# Patient Record
Sex: Female | Born: 1970 | Race: Black or African American | Hispanic: No | Marital: Single | Smoking: Never smoker
Health system: Southern US, Community
[De-identification: ages and names within clinical notes are randomized; demographics above are authoritative.]

## PROBLEM LIST (undated history)

## (undated) DIAGNOSIS — M2550 Pain in unspecified joint: Secondary | ICD-10-CM

## (undated) DIAGNOSIS — E039 Hypothyroidism, unspecified: Secondary | ICD-10-CM

## (undated) DIAGNOSIS — G4730 Sleep apnea, unspecified: Secondary | ICD-10-CM

## (undated) DIAGNOSIS — E0590 Thyrotoxicosis, unspecified without thyrotoxic crisis or storm: Secondary | ICD-10-CM

## (undated) DIAGNOSIS — R600 Localized edema: Secondary | ICD-10-CM

## (undated) DIAGNOSIS — G43909 Migraine, unspecified, not intractable, without status migrainosus: Secondary | ICD-10-CM

## (undated) DIAGNOSIS — L603 Nail dystrophy: Secondary | ICD-10-CM

## (undated) DIAGNOSIS — B351 Tinea unguium: Secondary | ICD-10-CM

## (undated) DIAGNOSIS — M545 Low back pain, unspecified: Secondary | ICD-10-CM

## (undated) DIAGNOSIS — D649 Anemia, unspecified: Secondary | ICD-10-CM

## (undated) DIAGNOSIS — R809 Proteinuria, unspecified: Secondary | ICD-10-CM

## (undated) DIAGNOSIS — D573 Sickle-cell trait: Secondary | ICD-10-CM

## (undated) DIAGNOSIS — R202 Paresthesia of skin: Secondary | ICD-10-CM

## (undated) DIAGNOSIS — J45909 Unspecified asthma, uncomplicated: Secondary | ICD-10-CM

## (undated) DIAGNOSIS — R0602 Shortness of breath: Secondary | ICD-10-CM

## (undated) DIAGNOSIS — E119 Type 2 diabetes mellitus without complications: Secondary | ICD-10-CM

## (undated) DIAGNOSIS — F32A Depression, unspecified: Secondary | ICD-10-CM

## (undated) DIAGNOSIS — R5383 Other fatigue: Secondary | ICD-10-CM

## (undated) DIAGNOSIS — K8020 Calculus of gallbladder without cholecystitis without obstruction: Secondary | ICD-10-CM

## (undated) DIAGNOSIS — R609 Edema, unspecified: Secondary | ICD-10-CM

## (undated) DIAGNOSIS — G8929 Other chronic pain: Secondary | ICD-10-CM

## (undated) DIAGNOSIS — K5900 Constipation, unspecified: Secondary | ICD-10-CM

## (undated) DIAGNOSIS — E1149 Type 2 diabetes mellitus with other diabetic neurological complication: Secondary | ICD-10-CM

## (undated) DIAGNOSIS — E1129 Type 2 diabetes mellitus with other diabetic kidney complication: Secondary | ICD-10-CM

## (undated) DIAGNOSIS — E079 Disorder of thyroid, unspecified: Secondary | ICD-10-CM

## (undated) DIAGNOSIS — F419 Anxiety disorder, unspecified: Secondary | ICD-10-CM

## (undated) DIAGNOSIS — G459 Transient cerebral ischemic attack, unspecified: Secondary | ICD-10-CM

## (undated) HISTORY — PX: HERNIA REPAIR: SHX51

## (undated) HISTORY — DX: Other chronic pain: G89.29

## (undated) HISTORY — PX: DILATION AND CURETTAGE OF UTERUS: SHX78

## (undated) HISTORY — DX: Sickle-cell trait: D57.3

## (undated) HISTORY — DX: Thyrotoxicosis, unspecified without thyrotoxic crisis or storm: E05.90

## (undated) HISTORY — DX: Shortness of breath: R06.02

## (undated) HISTORY — DX: Sleep apnea, unspecified: G47.30

## (undated) HISTORY — DX: Depression, unspecified: F32.A

## (undated) HISTORY — DX: Nail dystrophy: L60.3

## (undated) HISTORY — DX: Hypothyroidism, unspecified: E03.9

## (undated) HISTORY — DX: Other fatigue: R53.83

## (undated) HISTORY — DX: Anxiety disorder, unspecified: F41.9

## (undated) HISTORY — DX: Calculus of gallbladder without cholecystitis without obstruction: K80.20

## (undated) HISTORY — PX: CHOLECYSTECTOMY: SHX55

## (undated) HISTORY — DX: Tinea unguium: B35.1

## (undated) HISTORY — DX: Localized edema: R60.0

## (undated) HISTORY — DX: Migraine, unspecified, not intractable, without status migrainosus: G43.909

## (undated) HISTORY — DX: Low back pain, unspecified: M54.50

## (undated) HISTORY — DX: Unspecified asthma, uncomplicated: J45.909

## (undated) HISTORY — DX: Pain in unspecified joint: M25.50

## (undated) HISTORY — DX: Constipation, unspecified: K59.00

## (undated) HISTORY — DX: Low back pain: M54.5

---

## 2015-08-26 DIAGNOSIS — L603 Nail dystrophy: Secondary | ICD-10-CM | POA: Insufficient documentation

## 2015-08-26 DIAGNOSIS — B351 Tinea unguium: Secondary | ICD-10-CM | POA: Insufficient documentation

## 2016-02-13 DIAGNOSIS — G8929 Other chronic pain: Secondary | ICD-10-CM | POA: Insufficient documentation

## 2016-04-19 DIAGNOSIS — E611 Iron deficiency: Secondary | ICD-10-CM | POA: Insufficient documentation

## 2016-04-19 DIAGNOSIS — G4730 Sleep apnea, unspecified: Secondary | ICD-10-CM | POA: Insufficient documentation

## 2016-09-21 ENCOUNTER — Ambulatory Visit (HOSPITAL_COMMUNITY)
Admission: EM | Admit: 2016-09-21 | Discharge: 2016-09-21 | Disposition: A | Discharge status: Home / Self Care | Payer: Medicaid Other | Attending: Internal Medicine | Admitting: Internal Medicine

## 2016-09-21 ENCOUNTER — Encounter (HOSPITAL_COMMUNITY): Payer: Self-pay | Admitting: *Deleted

## 2016-09-21 DIAGNOSIS — R42 Dizziness and giddiness: Secondary | ICD-10-CM

## 2016-09-21 DIAGNOSIS — R110 Nausea: Secondary | ICD-10-CM

## 2016-09-21 HISTORY — DX: Type 2 diabetes mellitus without complications: E11.9

## 2016-09-21 HISTORY — DX: Edema, unspecified: R60.9

## 2016-09-21 HISTORY — DX: Anemia, unspecified: D64.9

## 2016-09-21 HISTORY — DX: Disorder of thyroid, unspecified: E07.9

## 2016-09-21 MED ORDER — ONDANSETRON 4 MG PO TBDP
4.0000 mg | ORAL_TABLET | Freq: Once | ORAL | Status: AC
  Administered 2016-09-21: 4 mg via ORAL

## 2016-09-21 MED ORDER — ONDANSETRON 4 MG PO TBDP
ORAL_TABLET | ORAL | Status: AC
  Filled 2016-09-21: qty 1

## 2016-09-21 MED ORDER — IPRATROPIUM BROMIDE 0.06 % NA SOLN: 2.0000 | Freq: Four times a day (QID) | NASAL | 0 refills | Status: DC

## 2016-09-21 MED ORDER — MECLIZINE HCL 25 MG PO TABS: 25.0000 mg | ORAL_TABLET | Freq: Three times a day (TID) | ORAL | 0 refills | Status: DC | PRN

## 2016-09-21 NOTE — ED Triage Notes (Signed)
Pt  Reports   sev  Weeks  Of  dizzyyness  Nausea   Chills  Fatigue        Worse  Last  Pm  Pt  Is  Anemic   And  Has  hypothriod       Symptoms  Worse  Last  Pm

## 2016-09-21 NOTE — ED Provider Notes (Signed)
CSN: [id number]     Arrival date & time 09/21/16  1317 History   None    Chief Complaint  Patient presents with  . Dizziness   (Consider location/radiation/quality/duration/timing/severity/associated sxs/prior Treatment) Patient c/o dizziness, nausea, and fatigue.   The history is provided by the patient.  Dizziness  Quality:  Head spinning Severity:  Mild Onset quality:  Sudden Duration:  1 day Timing:  Constant Progression:  Worsening Chronicity:  New Context: bending over and head movement   Relieved by:  Nothing Worsened by:  Nothing Ineffective treatments:  None tried   Past Medical History:  Diagnosis Date  . Anemia   . Diabetes mellitus without complication (HCC)   . Edema   . Thyroid disease    Past Surgical History:  Procedure Laterality Date  . CHOLECYSTECTOMY    . DILATION AND CURETTAGE OF UTERUS    . HERNIA REPAIR     History reviewed. No pertinent family history. Social History  Substance Use Topics  . Smoking status: Never Smoker  . Smokeless tobacco: Never Used  . Alcohol use No   OB History    No data available     Review of Systems  Constitutional: Negative.   HENT: Positive for congestion.   Eyes: Negative.   Respiratory: Negative.   Cardiovascular: Negative.   Gastrointestinal: Negative.   Endocrine: Negative.   Genitourinary: Negative.   Musculoskeletal: Negative.   Neurological: Positive for dizziness.  Hematological: Negative.   Psychiatric/Behavioral: Negative.     Allergies  Patient has no known allergies.  Home Medications   Prior to Admission medications   Medication Sig Start Date End Date Taking? Authorizing Provider  Dulaglutide (TRULICITY) 1.5 MG/0.5ML SOPN Inject into the skin.   Yes Historical Provider, MD  glimepiride (AMARYL) 4 MG tablet Take 4 mg by mouth 2 (two) times daily.   Yes Historical Provider, MD  Insulin Glargine (LANTUS [place]) Inject into the skin.   Yes Historical Provider, MD  levothyroxine  (SYNTHROID, LEVOTHROID) 137 MCG tablet Take 137 mcg by mouth daily before breakfast.   Yes Historical Provider, MD  ipratropium (ATROVENT) 0.06 % nasal spray Place 2 sprays into both nostrils 4 (four) times daily. 09/21/16   Deatra Canter, FNP  meclizine (ANTIVERT) 25 MG tablet Take 1 tablet (25 mg total) by mouth 3 (three) times daily as needed for dizziness. 09/21/16   Deatra Canter, FNP   Meds Ordered and Administered this Visit   Medications  ondansetron (ZOFRAN-ODT) disintegrating tablet 4 mg (4 mg Oral Given 09/21/16 1507)    BP 108/77 (BP Location: Right Arm)   Pulse 77   Temp 98.4 F (36.9 C) (Oral)   Resp 18   LMP 08/26/2016   SpO2 100%  No data found.   Physical Exam  Constitutional: She appears well-developed and well-nourished.  HENT:  Head: Normocephalic.  Right Ear: External ear normal.  Left Ear: External ear normal.  Mouth/Throat: Oropharynx is clear and moist.  Eyes: Conjunctivae and EOM are normal. Pupils are equal, round, and reactive to light.  Neck: Normal range of motion. Neck supple.  Cardiovascular: Normal rate, regular rhythm and normal heart sounds.   Pulmonary/Chest: Effort normal and breath sounds normal.  Abdominal: Soft. Bowel sounds are normal.  Nursing note and vitals reviewed.   Urgent Care Course     Procedures (including critical care time)  Labs Review Labs Reviewed - No data to display  Imaging Review No results found.   Visual Acuity Review  Right  Eye Distance:   Left Eye Distance:   Bilateral Distance:    Right Eye Near:   Left Eye Near:    Bilateral Near:         MDM   1. Vertigo   2. Nausea    Meclizine 25mg  one po tid prn Atrovent nasal spray  Push po fluids, rest, tylenol and motrin otc prn as directed for fever, arthralgias, and myalgias.  Follow up prn if sx's continue or persist.   Deatra CanterWilliam J Tiffony Kite, FNP 09/21/16 [phone]

## 2017-04-29 ENCOUNTER — Encounter: Payer: Self-pay | Admitting: *Deleted

## 2017-05-02 ENCOUNTER — Telehealth: Payer: Self-pay | Admitting: Neurology

## 2017-05-02 ENCOUNTER — Ambulatory Visit: Payer: Medicaid Other | Admitting: Neurology

## 2017-05-02 NOTE — Telephone Encounter (Signed)
This patient did not show for a new patient appointment today. 

## 2017-05-03 ENCOUNTER — Encounter: Payer: Self-pay | Admitting: Neurology

## 2017-05-17 ENCOUNTER — Encounter (INDEPENDENT_AMBULATORY_CARE_PROVIDER_SITE_OTHER): Payer: Self-pay

## 2017-05-17 ENCOUNTER — Encounter: Payer: Self-pay | Admitting: Neurology

## 2017-05-17 ENCOUNTER — Ambulatory Visit: Payer: Medicaid Other | Admitting: Neurology

## 2017-05-17 VITALS — BP 121/78 | HR 80 | Ht 65.0 in | Wt 197.5 lb

## 2017-05-17 DIAGNOSIS — R202 Paresthesia of skin: Secondary | ICD-10-CM

## 2017-05-17 HISTORY — DX: Paresthesia of skin: R20.2

## 2017-05-17 MED ORDER — GABAPENTIN 100 MG PO CAPS: 100.0000 mg | ORAL_CAPSULE | Freq: Three times a day (TID) | ORAL | 3 refills | Status: DC

## 2017-05-17 NOTE — Patient Instructions (Signed)
   We will get EMG and NCV evaluation to look at the nerve function of the arms and legs.

## 2017-05-17 NOTE — Progress Notes (Signed)
Reason for visit: Paresthesias  Referring physician: Dr. Lillia Carmelackett  Lindsey Bowen is a 46 y.o. female  History of present illness:  Lindsey Bowen is a 46 year old right-handed black female with a history of discomfort in the neck and low back and some paresthesias down the left arm primarily that have been present off and on since around 2009.  The patient believes that her symptoms came on when she worked for the [employer] when she was lifting and carrying things on a regular basis.  The patient indicates that she has paresthesias from the shoulder level all the way down to the hand that may come and go, sometimes she feels normal, but the episodes of symptomatology may last 1-2 days.  Occasionally she may have brief episodes on the right arm, and she has intermittent numbness down both legs as well.  The patient also reports chronic low back pain.  The patient occasionally have some collapse of the left leg, otherwise there is no weakness of the extremities.  The patient reports no difficulty controlling the bowels or the bladder.  She has had a bone spur on the left shoulder, she gets injections in the left shoulder on occasion.  The patient has had poorly controlled diabetes, she believes the last hemoglobin A1c level was 10.3.  The patient denies any symptomatology down either arm or leg with neck flexion or extension or rotation of the neck.  She is sent to this office for further evaluation of the above symptoms.  She does not take anything for the pain.  At times, she gets a crawling sensation down the left arm.  Past Medical History:  Diagnosis Date  . Anemia   . Diabetes mellitus without complication (HCC)   . Edema   . Lumbar pain   . Onychodystrophy   . Onychomycosis   . Sleep apnea   . Thyroid disease     Past Surgical History:  Procedure Laterality Date  . CHOLECYSTECTOMY    . DILATION AND CURETTAGE OF UTERUS    . HERNIA REPAIR      Family History  Problem Relation  Age of Onset  . Diabetes Mother   . Hypertension Mother   . Heart attack Maternal Grandmother   . Stroke Maternal Grandmother   . Diabetes Maternal Grandfather   . Cancer Maternal Grandfather     Social history:  reports that  has never smoked. she has never used smokeless tobacco. She reports that she does not drink alcohol. Her drug history is not on file.  Medications:  Prior to Admission medications   Medication Sig Start Date End Date Taking? Authorizing Provider  atorvastatin (LIPITOR) 10 MG tablet Take 10 mg by mouth daily.   Yes [provider]  cyclobenzaprine (FLEXERIL) 5 MG tablet Take 5 mg by mouth 3 (three) times daily as needed for muscle spasms.   Yes [provider]  Dulaglutide (TRULICITY) 1.5 MG/0.5ML SOPN Inject into the skin.   Yes [provider]  insulin aspart (NOVOLOG) 100 UNIT/ML injection Inject 100 Units into the skin.   Yes [provider]  Insulin Glargine (LANTUS [place]) Inject into the skin.   Yes [provider]  ipratropium (ATROVENT) 0.06 % nasal spray Place 2 sprays into both nostrils 4 (four) times daily. 09/21/16  Yes Deatra Canterxford, William J, FNP  levothyroxine (SYNTHROID, LEVOTHROID) 137 MCG tablet Take 137 mcg by mouth daily before breakfast.   Yes [provider]  lisinopril (PRINIVIL,ZESTRIL) 5 MG tablet Take  5 mg by mouth daily.   Yes [provider]  meclizine (ANTIVERT) 25 MG tablet Take 1 tablet (25 mg total) by mouth 3 (three) times daily as needed for dizziness. 09/21/16  Yes Deatra Canterxford, William J, FNP      Allergies  Allergen Reactions  . Dust Mite Extract Hives and Itching  . Shellfish Allergy Anaphylaxis    ROS:  Out of a complete 14 system review of symptoms, the patient complains only of the following symptoms, and all other reviewed systems are negative.  Chest pain, swelling in the legs Ringing in the ears Itching Blurred vision, eye pain Snoring Anemia Joint pain, aching  muscles Allergies Memory loss, headache, numbness, dizziness Depression, anxiety, none of sleep, decreased energy, racing thoughts Insomnia, sleepiness, restless legs  Blood pressure 121/78, pulse 80, height 5\' 5"  (1.651 m), weight 197 lb 8 oz (89.6 kg).  Physical Exam  General: The patient is alert and cooperative at the time of the examination.  Patient is moderately obese.  Eyes: Pupils are equal, round, and reactive to light. Discs are flat bilaterally.  Good venous pulsations are noted.  Neck: The neck is supple, no carotid bruits are noted.  Respiratory: The respiratory examination is clear.  Cardiovascular: The cardiovascular examination reveals a regular rate and rhythm, no obvious murmurs or rubs are noted.  Neuromuscular: Range of movement of the cervical spine and lumbar spine are excellent.  Skin: Extremities are without significant edema.  Neurologic Exam  Mental status: The patient is alert and oriented x 3 at the time of the examination. The patient has apparent normal recent and remote memory, with an apparently normal attention span and concentration ability.  Cranial nerves: Facial symmetry is present. There is good sensation of the face to pinprick and soft touch bilaterally. The strength of the facial muscles and the muscles to head turning and shoulder shrug are normal bilaterally. Speech is well enunciated, no aphasia or dysarthria is noted. Extraocular movements are full. Visual fields are full. The tongue is midline, and the patient has symmetric elevation of the soft palate. No obvious hearing deficits are noted.  Motor: The motor testing reveals 5 over 5 strength of all 4 extremities. Good symmetric motor tone is noted throughout.  Sensory: Sensory testing is intact to pinprick, soft touch, vibration sensation, and position sense on all 4 extremities, with exception of some decrease in pinprick sensation on the left forearm and left foot. No evidence of  extinction is noted.  Coordination: Cerebellar testing reveals good finger-nose-finger and heel-to-shin bilaterally.  Gait and station: Gait is normal. Tandem gait is normal. Romberg is negative. No drift is seen.  Reflexes: Deep tendon reflexes are symmetric and normal bilaterally. Toes are downgoing bilaterally.   Assessment/Plan:  1.  Paresthesias left arm, both legs  2.  Chronic neck and low back pain  The patient very well may have a fibromyalgia syndrome.  She has diffuse neuromuscular discomfort, paresthesias can go down the arms and legs, left greater than right.  The patient will be set up for nerve conduction studies done on both legs and left arm and EMG evaluation of the left arm.  The patient may go on to have further blood work depending upon the results of the above.  The patient will be placed on low-dose gabapentin taking 100 mg 3 times daily.  She will follow-up for the above study.  Marlan Palau. Keith Angie Hogg MD 05/17/2017 2:05 PM  [facility] [address] [place],  [place] [address]  Phone [phone] Fax [phone]

## 2017-05-26 ENCOUNTER — Encounter (HOSPITAL_COMMUNITY): Payer: Self-pay

## 2017-05-26 ENCOUNTER — Observation Stay (HOSPITAL_COMMUNITY): Payer: Medicaid Other

## 2017-05-26 ENCOUNTER — Observation Stay (HOSPITAL_COMMUNITY)
Admission: EM | Admit: 2017-05-26 | Discharge: 2017-05-27 | Disposition: A | Discharge status: Home / Self Care | Payer: Medicaid Other | Attending: Family Medicine | Admitting: Family Medicine

## 2017-05-26 ENCOUNTER — Emergency Department (HOSPITAL_COMMUNITY): Payer: Medicaid Other

## 2017-05-26 DIAGNOSIS — R809 Proteinuria, unspecified: Secondary | ICD-10-CM | POA: Diagnosis not present

## 2017-05-26 DIAGNOSIS — R5381 Other malaise: Secondary | ICD-10-CM | POA: Insufficient documentation

## 2017-05-26 DIAGNOSIS — E1159 Type 2 diabetes mellitus with other circulatory complications: Secondary | ICD-10-CM | POA: Diagnosis present

## 2017-05-26 DIAGNOSIS — E1140 Type 2 diabetes mellitus with diabetic neuropathy, unspecified: Secondary | ICD-10-CM | POA: Insufficient documentation

## 2017-05-26 DIAGNOSIS — Z9049 Acquired absence of other specified parts of digestive tract: Secondary | ICD-10-CM | POA: Insufficient documentation

## 2017-05-26 DIAGNOSIS — E785 Hyperlipidemia, unspecified: Secondary | ICD-10-CM | POA: Diagnosis not present

## 2017-05-26 DIAGNOSIS — Z823 Family history of stroke: Secondary | ICD-10-CM | POA: Diagnosis not present

## 2017-05-26 DIAGNOSIS — G43909 Migraine, unspecified, not intractable, without status migrainosus: Secondary | ICD-10-CM | POA: Diagnosis not present

## 2017-05-26 DIAGNOSIS — R531 Weakness: Secondary | ICD-10-CM | POA: Diagnosis not present

## 2017-05-26 DIAGNOSIS — R946 Abnormal results of thyroid function studies: Secondary | ICD-10-CM | POA: Diagnosis not present

## 2017-05-26 DIAGNOSIS — R0602 Shortness of breath: Secondary | ICD-10-CM | POA: Diagnosis not present

## 2017-05-26 DIAGNOSIS — R200 Anesthesia of skin: Secondary | ICD-10-CM | POA: Diagnosis not present

## 2017-05-26 DIAGNOSIS — M6289 Other specified disorders of muscle: Secondary | ICD-10-CM

## 2017-05-26 DIAGNOSIS — E1129 Type 2 diabetes mellitus with other diabetic kidney complication: Secondary | ICD-10-CM | POA: Diagnosis present

## 2017-05-26 DIAGNOSIS — Z8249 Family history of ischemic heart disease and other diseases of the circulatory system: Secondary | ICD-10-CM | POA: Insufficient documentation

## 2017-05-26 DIAGNOSIS — Z9109 Other allergy status, other than to drugs and biological substances: Secondary | ICD-10-CM | POA: Insufficient documentation

## 2017-05-26 DIAGNOSIS — G459 Transient cerebral ischemic attack, unspecified: Secondary | ICD-10-CM | POA: Diagnosis present

## 2017-05-26 DIAGNOSIS — E1169 Type 2 diabetes mellitus with other specified complication: Secondary | ICD-10-CM | POA: Diagnosis not present

## 2017-05-26 DIAGNOSIS — I10 Essential (primary) hypertension: Secondary | ICD-10-CM | POA: Diagnosis present

## 2017-05-26 DIAGNOSIS — E039 Hypothyroidism, unspecified: Secondary | ICD-10-CM | POA: Diagnosis present

## 2017-05-26 DIAGNOSIS — Z91013 Allergy to seafood: Secondary | ICD-10-CM | POA: Diagnosis not present

## 2017-05-26 DIAGNOSIS — B351 Tinea unguium: Secondary | ICD-10-CM | POA: Diagnosis not present

## 2017-05-26 DIAGNOSIS — I152 Hypertension secondary to endocrine disorders: Secondary | ICD-10-CM | POA: Diagnosis present

## 2017-05-26 DIAGNOSIS — Z809 Family history of malignant neoplasm, unspecified: Secondary | ICD-10-CM | POA: Diagnosis not present

## 2017-05-26 DIAGNOSIS — G4730 Sleep apnea, unspecified: Secondary | ICD-10-CM | POA: Diagnosis not present

## 2017-05-26 DIAGNOSIS — R202 Paresthesia of skin: Secondary | ICD-10-CM | POA: Diagnosis not present

## 2017-05-26 DIAGNOSIS — Z833 Family history of diabetes mellitus: Secondary | ICD-10-CM | POA: Insufficient documentation

## 2017-05-26 DIAGNOSIS — I6523 Occlusion and stenosis of bilateral carotid arteries: Secondary | ICD-10-CM | POA: Insufficient documentation

## 2017-05-26 DIAGNOSIS — E1149 Type 2 diabetes mellitus with other diabetic neurological complication: Secondary | ICD-10-CM | POA: Diagnosis present

## 2017-05-26 DIAGNOSIS — Z794 Long term (current) use of insulin: Secondary | ICD-10-CM | POA: Insufficient documentation

## 2017-05-26 DIAGNOSIS — Z79899 Other long term (current) drug therapy: Secondary | ICD-10-CM | POA: Insufficient documentation

## 2017-05-26 DIAGNOSIS — D649 Anemia, unspecified: Secondary | ICD-10-CM | POA: Insufficient documentation

## 2017-05-26 DIAGNOSIS — R4781 Slurred speech: Principal | ICD-10-CM | POA: Insufficient documentation

## 2017-05-26 HISTORY — DX: Type 2 diabetes mellitus with other diabetic kidney complication: E11.29

## 2017-05-26 HISTORY — DX: Paresthesia of skin: R20.2

## 2017-05-26 HISTORY — DX: Type 2 diabetes mellitus with other diabetic neurological complication: E11.49

## 2017-05-26 HISTORY — DX: Proteinuria, unspecified: R80.9

## 2017-05-26 HISTORY — DX: Transient cerebral ischemic attack, unspecified: G45.9

## 2017-05-26 LAB — GLUCOSE, CAPILLARY: GLUCOSE-CAPILLARY: 346 mg/dL — AB (ref 65–99)

## 2017-05-26 LAB — CBC
HCT: 36.6 % (ref 36.0–46.0)
HEMOGLOBIN: 11.8 g/dL — AB (ref 12.0–15.0)
MCH: 23.3 pg — AB (ref 26.0–34.0)
MCHC: 32.2 g/dL (ref 30.0–36.0)
MCV: 72.3 fL — ABNORMAL LOW (ref 78.0–100.0)
Platelets: 253 10*3/uL (ref 150–400)
RBC: 5.06 MIL/uL (ref 3.87–5.11)
RDW: 13.5 % (ref 11.5–15.5)
WBC: 5.9 10*3/uL (ref 4.0–10.5)

## 2017-05-26 LAB — DIFFERENTIAL
BASOS ABS: 0 10*3/uL (ref 0.0–0.1)
Basophils Relative: 0 %
EOS ABS: 0.2 10*3/uL (ref 0.0–0.7)
Eosinophils Relative: 4 %
LYMPHS PCT: 37 %
Lymphs Abs: 2.2 10*3/uL (ref 0.7–4.0)
Monocytes Absolute: 0.2 10*3/uL (ref 0.1–1.0)
Monocytes Relative: 4 %
NEUTROS PCT: 55 %
Neutro Abs: 3.3 10*3/uL (ref 1.7–7.7)

## 2017-05-26 LAB — PROTIME-INR
INR: 0.97
Prothrombin Time: 12.8 seconds (ref 11.4–15.2)

## 2017-05-26 LAB — COMPREHENSIVE METABOLIC PANEL
ALT: 20 U/L (ref 14–54)
AST: 15 U/L (ref 15–41)
Albumin: 3.8 g/dL (ref 3.5–5.0)
Alkaline Phosphatase: 68 U/L (ref 38–126)
Anion gap: 5 (ref 5–15)
BUN: 8 mg/dL (ref 6–20)
CHLORIDE: 104 mmol/L (ref 101–111)
CO2: 26 mmol/L (ref 22–32)
CREATININE: 0.7 mg/dL (ref 0.44–1.00)
Calcium: 8.6 mg/dL — ABNORMAL LOW (ref 8.9–10.3)
GFR calc Af Amer: >60 mL/min (ref >60)
GFR calc non Af Amer: >60 mL/min (ref >60)
Glucose, Bld: 222 mg/dL — ABNORMAL HIGH (ref 65–99)
Potassium: 3.4 mmol/L — ABNORMAL LOW (ref 3.5–5.1)
SODIUM: 135 mmol/L (ref 135–145)
Total Bilirubin: 0.5 mg/dL (ref 0.3–1.2)
Total Protein: 6.7 g/dL (ref 6.5–8.1)

## 2017-05-26 LAB — I-STAT CHEM 8, ED
BUN: 9 mg/dL (ref 6–20)
CREATININE: 0.6 mg/dL (ref 0.44–1.00)
Calcium, Ion: 1.22 mmol/L (ref 1.15–1.40)
Chloride: 100 mmol/L — ABNORMAL LOW (ref 101–111)
Glucose, Bld: 224 mg/dL — ABNORMAL HIGH (ref 65–99)
HEMATOCRIT: 40 % (ref 36.0–46.0)
HEMOGLOBIN: 13.6 g/dL (ref 12.0–15.0)
POTASSIUM: 3.6 mmol/L (ref 3.5–5.1)
Sodium: 139 mmol/L (ref 135–145)
TCO2: 26 mmol/L (ref 22–32)

## 2017-05-26 LAB — CBG MONITORING, ED: Glucose-Capillary: 199 mg/dL — ABNORMAL HIGH (ref 65–99)

## 2017-05-26 LAB — APTT: aPTT: 27 seconds (ref 24–36)

## 2017-05-26 LAB — I-STAT BETA HCG BLOOD, ED (MC, WL, AP ONLY): I-stat hCG, quantitative: <5 m[IU]/mL (ref <5)

## 2017-05-26 LAB — I-STAT TROPONIN, ED: Troponin i, poc: 0 ng/mL (ref 0.00–0.08)

## 2017-05-26 MED ORDER — ACETAMINOPHEN 325 MG PO TABS
650.0000 mg | ORAL_TABLET | ORAL | Status: DC | PRN
  Administered 2017-05-27: 650 mg via ORAL
  Filled 2017-05-26: qty 2

## 2017-05-26 MED ORDER — ACETAMINOPHEN 160 MG/5ML PO SOLN: 650.0000 mg | ORAL | Status: DC | PRN

## 2017-05-26 MED ORDER — INSULIN ASPART 100 UNIT/ML ~~LOC~~ SOLN
0.0000 [IU] | SUBCUTANEOUS | Status: DC
  Administered 2017-05-27: 11 [IU] via SUBCUTANEOUS

## 2017-05-26 MED ORDER — STROKE: EARLY STAGES OF RECOVERY BOOK
Freq: Once | Status: AC
  Administered 2017-05-27: 01:00:00
  Filled 2017-05-26: qty 1

## 2017-05-26 MED ORDER — SODIUM CHLORIDE 0.9 % IV SOLN
INTRAVENOUS | Status: DC
  Administered 2017-05-27 (×2): via INTRAVENOUS

## 2017-05-26 MED ORDER — ASPIRIN 81 MG PO CHEW
81.0000 mg | CHEWABLE_TABLET | Freq: Every day | ORAL | Status: DC
  Administered 2017-05-27: 81 mg via ORAL
  Filled 2017-05-26: qty 1

## 2017-05-26 MED ORDER — INSULIN GLARGINE 100 UNIT/ML ~~LOC~~ SOLN
20.0000 [IU] | Freq: Every day | SUBCUTANEOUS | Status: DC
  Administered 2017-05-27: 20 [IU] via SUBCUTANEOUS
  Filled 2017-05-26 (×3): qty 0.2

## 2017-05-26 MED ORDER — LISINOPRIL 5 MG PO TABS
5.0000 mg | ORAL_TABLET | Freq: Every day | ORAL | Status: DC
  Administered 2017-05-27: 5 mg via ORAL
  Filled 2017-05-26: qty 1

## 2017-05-26 MED ORDER — ATORVASTATIN CALCIUM 10 MG PO TABS: 10.0000 mg | ORAL_TABLET | Freq: Every day | ORAL | Status: DC

## 2017-05-26 MED ORDER — ENOXAPARIN SODIUM 40 MG/0.4ML ~~LOC~~ SOLN
40.0000 mg | SUBCUTANEOUS | Status: DC
  Administered 2017-05-27: 40 mg via SUBCUTANEOUS
  Filled 2017-05-26: qty 0.4

## 2017-05-26 MED ORDER — POTASSIUM CHLORIDE CRYS ER 20 MEQ PO TBCR: 20.0000 meq | EXTENDED_RELEASE_TABLET | Freq: Once | ORAL | Status: DC

## 2017-05-26 MED ORDER — LEVOTHYROXINE SODIUM 150 MCG PO TABS
150.0000 ug | ORAL_TABLET | Freq: Every day | ORAL | Status: DC
  Administered 2017-05-27: 150 ug via ORAL
  Filled 2017-05-26: qty 2
  Filled 2017-05-26: qty 1

## 2017-05-26 MED ORDER — ACETAMINOPHEN 650 MG RE SUPP: 650.0000 mg | RECTAL | Status: DC | PRN

## 2017-05-26 NOTE — ED Triage Notes (Addendum)
[facility] where she wanted to be evaluated with right-sided numbness. Pt reports waking up at 0400 this morning to go to the bathroom when she noted that her right arm and leg felt numb. Pt reports calling to her son and noting that she had slurred speech. Pt reports that she tried to get something for her blood sugar and she was unable to use her right hand. Ate an apple and happened to be seen by family member. Heard family speaking with her, but she couldn't get all her words out. Reports all symptoms subsided since she went back to bed and woke up. Some dizzy spells noted since this episode.

## 2017-05-26 NOTE — ED Notes (Signed)
Patient taken to MRI

## 2017-05-26 NOTE — Progress Notes (Signed)
Received from ED via stretcher.  A&O, denies pain.  Verbalizes understanding of safety precautions, plan of care & care of valuables.  See systems assessment.

## 2017-05-26 NOTE — ED Provider Notes (Signed)
[facility] Provider Note   CSN: [id number] Arrival date & time: 05/26/17  1325     History   Chief Complaint Chief Complaint  Patient presents with  . Numbness    HPI Lindsey Bowen is a 46 y.o. female who presents with right-sided numbness, weakness, fatigue.  Past medical history significant for insulin dependent diabetes and hypothyroidism. The patient states that she woke up at 4 AM this morning to go to the bathroom and when she was walking back she felt that the right side of her body was weak and numb.  The she attributed this to her blood sugar being low and tried to go get a snack but was having difficulty walking and fell on the floor.  A family member was in the house and found her and checked her blood sugar was which was in the 120s.  She was also having difficulty speaking and her family member could not understand her. She had to get ready to go to work so was able to get up and went back to sleep hoping her symptoms would improve which they did.  She went to work but has had generalized fatigue and some residual mild right arm heaviness.  She told her supervisor about the incident who recommended she come get her symptoms checked out.  She denies fever, chills, headache, current numbness or tingling, vision loss, chest pain, shortness of breath, abdominal pain.  She does see neurology for paresthesias in the left arm however she has not had any symptoms in the right. She is set up for nerve conduction studies next week. She was recently started on Gabapentin TID. She states she has been working 12 hour shifts for the past week so attributed her symptoms to stress/being overworked.  HPI  Past Medical History:  Diagnosis Date  . Anemia   . Diabetes mellitus without complication (HCC)   . Edema   . Lumbar pain   . Onychodystrophy   . Onychomycosis   . Sleep apnea   . Thyroid disease     Patient Active Problem List   Diagnosis  Date Noted  . Paresthesia 05/17/2017    Past Surgical History:  Procedure Laterality Date  . CHOLECYSTECTOMY    . DILATION AND CURETTAGE OF UTERUS    . HERNIA REPAIR      OB History    No data available       Home Medications    Prior to Admission medications   Medication Sig Start Date End Date Taking? Authorizing Provider  acetaminophen (TYLENOL) 650 MG CR tablet Take 650 mg by mouth every 8 (eight) hours as needed for pain.   Yes [provider]  atorvastatin (LIPITOR) 10 MG tablet Take 10 mg by mouth daily.   Yes [provider]  cyclobenzaprine (FLEXERIL) 5 MG tablet Take 5 mg by mouth 3 (three) times daily as needed for muscle spasms.   Yes [provider]  Dulaglutide (TRULICITY) 1.5 MG/0.5ML SOPN Inject 1.5 mg into the skin once a week.    Yes [provider]  insulin aspart (NOVOLOG) 100 UNIT/ML injection Inject 10 Units into the skin 3 (three) times daily with meals.    Yes [provider]  Insulin Glargine (LANTUS [place]) Inject 40 Units into the skin at bedtime.    Yes [provider]  levothyroxine (SYNTHROID, LEVOTHROID) 150 MCG tablet Take 150 mcg by mouth daily before breakfast.    Yes [provider]  lisinopril (  PRINIVIL,ZESTRIL) 5 MG tablet Take 5 mg by mouth daily.   Yes [provider]  meclizine (ANTIVERT) 25 MG tablet Take 1 tablet (25 mg total) by mouth 3 (three) times daily as needed for dizziness. 09/21/16  Yes Deatra Canterxford, William J, FNP  naproxen sodium (ALEVE) 220 MG tablet Take 220 mg by mouth daily as needed (headache).   Yes [provider]  gabapentin (NEURONTIN) 100 MG capsule Take 1 capsule (100 mg total) by mouth 3 (three) times daily. 05/17/17   York SpanielWillis, Charles K, MD  ipratropium (ATROVENT) 0.06 % nasal spray Place 2 sprays into both nostrils 4 (four) times daily. Patient not taking: Reported on 05/26/2017 09/21/16   Deatra Canterxford, William J, FNP    Family History Family History    Problem Relation Age of Onset  . Diabetes Mother   . Hypertension Mother   . Heart attack Maternal Grandmother   . Stroke Maternal Grandmother   . Diabetes Maternal Grandfather   . Cancer Maternal Grandfather     Social History Social History   Tobacco Use  . Smoking status: Never Smoker  . Smokeless tobacco: Never Used  Substance Use Topics  . Alcohol use: No  . Drug use: Not on file     Allergies   Dust mite extract and Shellfish allergy   Review of Systems Review of Systems  Constitutional: Negative for chills and fever.  Eyes: Negative for visual disturbance.  Respiratory: Negative for shortness of breath.   Cardiovascular: Negative for chest pain.  Gastrointestinal: Negative for abdominal pain.  Neurological: Positive for weakness, light-headedness and numbness. Negative for syncope and headaches.  All other systems reviewed and are negative.    Physical Exam Updated Vital Signs BP 122/75   Pulse 69   Temp 98.4 F (36.9 C) (Oral)   Resp 16   Ht 5\' 5"  (1.651 m)   Wt 91.6 kg (202 lb)   SpO2 100%   BMI 33.61 kg/m   Physical Exam  Constitutional: She is oriented to person, place, and time. She appears well-developed and well-nourished. No distress.  HENT:  Head: Normocephalic and atraumatic.  Eyes: Conjunctivae are normal. Pupils are equal, round, and reactive to light. Right eye exhibits no discharge. Left eye exhibits no discharge. No scleral icterus.  Neck: Normal range of motion.  Cardiovascular: Normal rate.  Pulmonary/Chest: Effort normal. No respiratory distress.  Abdominal: She exhibits no distension.  Neurological: She is alert and oriented to person, place, and time.  Mental Status:  Alert, oriented, thought content appropriate, able to give a coherent history. Speech fluent without evidence of aphasia. Able to follow 2 step commands without difficulty.  Cranial Nerves:  II:  Peripheral visual fields grossly normal, pupils equal, round,  reactive to light III,IV, VI: ptosis not present, extra-ocular motions intact bilaterally. Mild horizontal nystagmus. V,VII: smile symmetric, facial light touch sensation equal VIII: hearing grossly normal to voice  X: uvula elevates symmetrically  XI: bilateral shoulder shrug symmetric and strong XII: midline tongue extension without fassiculations Motor:  Normal tone. 5/5 in upper and lower extremities bilaterally including strong and equal grip strength and dorsiflexion/plantar flexion Sensory: Pinprick and light touch normal in all extremities.  Cerebellar: normal finger-to-nose with bilateral upper extremities Gait: normal gait and balance CV: distal pulses palpable throughout    Skin: Skin is warm and dry.  Psychiatric: She has a normal mood and affect. Her behavior is normal.  Nursing note and vitals reviewed.    ED Treatments / Results  Labs (  all labs ordered are listed, but only abnormal results are displayed) Labs Reviewed  CBC - Abnormal; Notable for the following components:      Result Value   Hemoglobin 11.8 (*)    MCV 72.3 (*)    MCH 23.3 (*)    All other components within normal limits  COMPREHENSIVE METABOLIC PANEL - Abnormal; Notable for the following components:   Potassium 3.4 (*)    Glucose, Bld 222 (*)    Calcium 8.6 (*)    All other components within normal limits  CBG MONITORING, ED - Abnormal; Notable for the following components:   Glucose-Capillary 199 (*)    All other components within normal limits  I-STAT CHEM 8, ED - Abnormal; Notable for the following components:   Chloride 100 (*)    Glucose, Bld 224 (*)    All other components within normal limits  PROTIME-INR  APTT  DIFFERENTIAL  I-STAT TROPONIN, ED  I-STAT BETA HCG BLOOD, ED ([facility], [facility], [facility] ONLY)    EKG  EKG Interpretation None       Radiology Ct Head Wo Contrast  Result Date: 05/26/2017 CLINICAL DATA:  46 year old female with history of acute onset of numbness at 4 a.m. this  morning in the right arm and left leg. EXAM: CT HEAD WITHOUT CONTRAST TECHNIQUE: Contiguous axial images were obtained from the base of the skull through the vertex without intravenous contrast. COMPARISON:  None. FINDINGS: Brain: No evidence of acute infarction, hemorrhage, hydrocephalus, extra-axial collection or mass lesion/mass effect. Vascular: No hyperdense vessel or unexpected calcification. Skull: Normal. Negative for fracture or focal lesion. Sinuses/Orbits: No acute finding. Other: None. IMPRESSION: 1. No acute intracranial abnormalities. 2. The appearance of the brain is normal. Electronically Signed   By: Trudie Reed M.D.   On: 05/26/2017 14:14    Procedures Procedures (including critical care time)  Medications Ordered in ED Medications - No data to display   Initial Impression / Assessment and Plan / ED Course  I have reviewed the triage vital signs and the nursing notes.  Pertinent labs & imaging results that were available during my care of the patient were reviewed by me and considered in my medical decision making (see chart for details).  45 year old female presents with right sided weakness, numbness, and difficulty talking. Symptoms are concerning for TIA. Vitals are normal. Neuro exam is unremarkable at this time. Labs are overall unremarkable other than hyperglycemia. EKG is normal. CT is negative. Discussed case with attending Dr. Rubin Payor. Will bring patient in for TIA work up.  I spoke with Dr. Natale Milch who will come to see patient.  Final Clinical Impressions(s) / ED Diagnoses   Final diagnoses:  Acute right-sided weakness  Slurred speech    ED Discharge Orders    None       Beryle Quant 05/26/17 2031    Benjiman Core, MD 05/27/17 [phone]

## 2017-05-26 NOTE — H&P (Signed)
Family Medicine Teaching [facility] Ad[facility] History and Physical Service Pager: [phone]  Patient name: Lindsey Bowen [place] Medical record number: [id number] Date of birth: [birth year] Age: 46 y.o. Gender: female  Primary Care Provider: [facility]: Neuro Code Status: Full  Chief Complaint: R-sided weakness, numbness  Assessment and Plan: Lindsey Bowen Aye is a 46 y.o. female presenting with R-sided weakness and numbness. PMH is significant for DMT2, HTN, HLD, hypothyroidism, L arm paresthesias.  TIA: Presenting with R-sided weakness. Currently with only R arm heaviness. CT head neg. No neuro deficits on exam. Speech normal. Risk factors include HTN, HLD, Type II DM. EKG on admission NSR, trop neg. Given reported glucose of 126 at home during incident and subsequent hyperglycemic CBGs since presentation (mid 200s), hypoglycemia less likely cause of symptoms. Has been normotensive since admission. Followed by neuro for parasthesias but no other known neurological disorders to explain symptoms.  - Admit to FPTS, attending McDiarmid - Cardiac monitoring - Neuro checks q2 x12hrs, then q4 - MRI brain - Consult neuro, appreciate recs - NPO while awaiting swallow eval - Risk stratification labs - lipid panel, A1C, TSH  - PT/OT eval - Fall precautions  Type II DM: Last A1C 11.6 01/2017. Currently taking Lantus 40U, Trulicity 1.5mg  q week, and Novolog 10U with meals. CBGs elevated since admission (199, 224,222), however has not taken insulin today.  - Continue Lantus at modified dose of 20U - Moderate SSI - A1C - CBG qACHS  HTN: On lisinopril 5mg  qd at home. Normotensive since admission.  - Hold lisinopril while NPO  Hypothyroidism: Last TSH 09/2016. On Synthroid 150mcg qd.  - TSH - Hold Synthroid while NPO   HLD: - Hold home atorvastatin while NPO  L arm paresthesias: Followed by neuro for this issue. Was prescribed gabapentin but never  started taking. Has never had any paresthesias or neuro issues in R arm until today.   FEN/GI: NPO for now, NS@125cc /hr Prophylaxis: Lovenox  Disposition: place in observation  History of Present Illness:  Lindsey Bowen Lindsey Bowen is a 46 y.o. female presenting with R-sided weakness and numbness.   Patient reports symptoms started around 4AM when she woke up to go to the bathroom. She noticed numbness in her R arm and leg, and felt as if she could not move the R side of her body. She called out to her son for help, but found that it was difficult to get her words out. She dragged herself to the bathroom, at which point she began to feel very shaky and became concerned that her blood sugar may be low. She tried to walk to the dining room to eat an apple, but fell to the ground. She was able to crawl to the apple, but then had difficulty chewing. At this time, her sister-in-law, then her son came into the dining room. She still had difficulty speaking to them. Family denies any facial droop at this time. Her son checked her blood sugar, which was 126. He then gave her candy. After eating some Skittle and more of the apple, she began to feel better and regained mobility of her arm and leg. She went back to sleep in preparation for going to work later in the day. When she woke up, she felt much better, but still felt a bit sluggish. Her supervisor at work noticed this and recommended she go to ED to be evaluated. Currently she is endorsing fatigue and a heaviness in her R arm but no other symptoms. Thinks  she is speaking normally.   Review Of Systems: Per HPI with the following additions:   Review of Systems  Constitutional: Positive for malaise/fatigue. Negative for diaphoresis.  HENT: Negative for congestion.   Eyes: Negative for blurred vision and double vision.  Respiratory: Positive for shortness of breath.   Cardiovascular: Negative for chest pain.  Musculoskeletal: Positive for falls.  Neurological:  Positive for speech change, focal weakness and weakness. Negative for tingling and loss of consciousness.    Patient Active Problem List   Diagnosis Date Noted  . Paresthesia 05/17/2017    Past Medical History: Past Medical History:  Diagnosis Date  . Anemia   . Diabetes mellitus without complication (HCC)   . Edema   . Lumbar pain   . Onychodystrophy   . Onychomycosis   . Sleep apnea   . Thyroid disease     Past Surgical History: Past Surgical History:  Procedure Laterality Date  . CHOLECYSTECTOMY    . DILATION AND CURETTAGE OF UTERUS    . HERNIA REPAIR      Social History: Social History   Tobacco Use  . Smoking status: Never Smoker  . Smokeless tobacco: Never Used  Substance Use Topics  . Alcohol use: No  . Drug use: Not on file   Additional social history: Lives at home with her adult son and sister-in-law.   Please also refer to relevant sections of EMR.  Family History: Family History  Problem Relation Age of Onset  . Diabetes Mother   . Hypertension Mother   . Heart attack Maternal Grandmother   . Stroke Maternal Grandmother   . Diabetes Maternal Grandfather   . Cancer Maternal Grandfather     Allergies and Medications: Allergies  Allergen Reactions  . Dust Mite Extract Hives and Itching  . Shellfish Allergy Anaphylaxis   No current facility-administered medications on file prior to encounter.    Current Outpatient Medications on File Prior to Encounter  Medication Sig Dispense Refill  . acetaminophen (TYLENOL) 650 MG CR tablet Take 650 mg by mouth every 8 (eight) hours as needed for pain.    Marland Kitchen atorvastatin (LIPITOR) 10 MG tablet Take 10 mg by mouth daily.    . cyclobenzaprine (FLEXERIL) 5 MG tablet Take 5 mg by mouth 3 (three) times daily as needed for muscle spasms.    . Dulaglutide (TRULICITY) 1.5 MG/0.5ML SOPN Inject 1.5 mg into the skin once a week.     . insulin aspart (NOVOLOG) 100 UNIT/ML injection Inject 10 Units into the skin 3  (three) times daily with meals.     . Insulin Glargine (LANTUS [place]) Inject 40 Units into the skin at bedtime.     Marland Kitchen levothyroxine (SYNTHROID, LEVOTHROID) 150 MCG tablet Take 150 mcg by mouth daily before breakfast.     . lisinopril (PRINIVIL,ZESTRIL) 5 MG tablet Take 5 mg by mouth daily.    . meclizine (ANTIVERT) 25 MG tablet Take 1 tablet (25 mg total) by mouth 3 (three) times daily as needed for dizziness. 30 tablet 0  . naproxen sodium (ALEVE) 220 MG tablet Take 220 mg by mouth daily as needed (headache).    . gabapentin (NEURONTIN) 100 MG capsule Take 1 capsule (100 mg total) by mouth 3 (three) times daily. 90 capsule 3  . ipratropium (ATROVENT) 0.06 % nasal spray Place 2 sprays into both nostrils 4 (four) times daily. (Patient not taking: Reported on 05/26/2017) 15 mL 0    Objective: BP 115/72   Pulse 71  Temp 98.4 F (36.9 C) (Oral)   Resp 16   Ht 5\' 5"  (1.651 m)   Wt 202 lb (91.6 kg)   SpO2 100%   BMI 33.61 kg/m  Exam: General: pleasant female sitting up in bed in NAD Eyes: PERRLA, EOMI, no nystagmus ENTM: MMM, no oropharyngeal erythema or exudates Neck: supple, full ROM, no lymphadenopathy Cardiovascular: RRR, no murmurs appreciated Respiratory: CTAB, no wheezes, normal WOB on RA Gastrointestinal: soft, NTND, +BS MSK: 5/5 strength in upper and lower extremities, full grip strength bilaterally Derm: skin warm and dry, no rashes noted Neuro: A&Ox4, CN II-XII grossly intact, sensation intact throughout, rapid alternating and finger to nose normal Psych: appropriate mood and affect  Labs and Imaging: CBC BMET  Recent Labs  Lab 05/26/17 1337 05/26/17 1355  WBC 5.9  --   HGB 11.8* 13.6  HCT 36.6 40.0  PLT 253  --    Recent Labs  Lab 05/26/17 1337 05/26/17 1355  NA 135 139  K 3.4* 3.6  CL 104 100*  CO2 26  --   BUN 8 9  CREATININE 0.70 0.60  GLUCOSE 222* 224*  CALCIUM 8.6*  --      Ct Head Wo Contrast  Result Date: 05/26/2017 CLINICAL DATA:  46 year old  female with history of acute onset of numbness at 4 a.m. this morning in the right arm and left leg. EXAM: CT HEAD WITHOUT CONTRAST TECHNIQUE: Contiguous axial images were obtained from the base of the skull through the vertex without intravenous contrast. COMPARISON:  None. FINDINGS: Brain: No evidence of acute infarction, hemorrhage, hydrocephalus, extra-axial collection or mass lesion/mass effect. Vascular: No hyperdense vessel or unexpected calcification. Skull: Normal. Negative for fracture or focal lesion. Sinuses/Orbits: No acute finding. Other: None. IMPRESSION: 1. No acute intracranial abnormalities. 2. The appearance of the brain is normal. Electronically Signed   By: Trudie Reedaniel  Entrikin M.D.   On: 05/26/2017 14:14   Marquette SaaLancaster, Rodrecus Belsky Joseph, MD 05/26/2017, 8:54 PM PGY-3,  Family Medicine FPTS Intern pager: [phone], text pages welcome

## 2017-05-26 NOTE — ED Notes (Signed)
Attempted report 

## 2017-05-27 ENCOUNTER — Observation Stay (HOSPITAL_BASED_OUTPATIENT_CLINIC_OR_DEPARTMENT_OTHER): Payer: Medicaid Other

## 2017-05-27 ENCOUNTER — Encounter (HOSPITAL_COMMUNITY): Payer: Self-pay | Admitting: Family Medicine

## 2017-05-27 ENCOUNTER — Other Ambulatory Visit: Payer: Self-pay

## 2017-05-27 DIAGNOSIS — I10 Essential (primary) hypertension: Secondary | ICD-10-CM | POA: Insufficient documentation

## 2017-05-27 DIAGNOSIS — E785 Hyperlipidemia, unspecified: Secondary | ICD-10-CM | POA: Insufficient documentation

## 2017-05-27 DIAGNOSIS — R4781 Slurred speech: Secondary | ICD-10-CM | POA: Diagnosis not present

## 2017-05-27 DIAGNOSIS — G459 Transient cerebral ischemic attack, unspecified: Secondary | ICD-10-CM

## 2017-05-27 DIAGNOSIS — E1169 Type 2 diabetes mellitus with other specified complication: Secondary | ICD-10-CM | POA: Diagnosis present

## 2017-05-27 DIAGNOSIS — E1159 Type 2 diabetes mellitus with other circulatory complications: Secondary | ICD-10-CM | POA: Diagnosis present

## 2017-05-27 DIAGNOSIS — E039 Hypothyroidism, unspecified: Secondary | ICD-10-CM | POA: Diagnosis present

## 2017-05-27 DIAGNOSIS — E1129 Type 2 diabetes mellitus with other diabetic kidney complication: Secondary | ICD-10-CM

## 2017-05-27 DIAGNOSIS — R809 Proteinuria, unspecified: Secondary | ICD-10-CM

## 2017-05-27 DIAGNOSIS — E1149 Type 2 diabetes mellitus with other diabetic neurological complication: Secondary | ICD-10-CM

## 2017-05-27 HISTORY — DX: Proteinuria, unspecified: R80.9

## 2017-05-27 HISTORY — DX: Type 2 diabetes mellitus with other diabetic neurological complication: E11.49

## 2017-05-27 HISTORY — DX: Proteinuria, unspecified: E11.29

## 2017-05-27 LAB — LIPID PANEL
CHOL/HDL RATIO: 3.1 ratio
CHOLESTEROL: 111 mg/dL (ref 0–200)
HDL: 36 mg/dL — AB (ref >40)
LDL Cholesterol: 61 mg/dL (ref 0–99)
Triglycerides: 72 mg/dL (ref <150)
VLDL: 14 mg/dL (ref 0–40)

## 2017-05-27 LAB — GLUCOSE, CAPILLARY
GLUCOSE-CAPILLARY: 165 mg/dL — AB (ref 65–99)
Glucose-Capillary: 77 mg/dL (ref 65–99)
Glucose-Capillary: 171 mg/dL — ABNORMAL HIGH (ref 65–99)
Glucose-Capillary: 174 mg/dL — ABNORMAL HIGH (ref 65–99)
Glucose-Capillary: 258 mg/dL — ABNORMAL HIGH (ref 65–99)

## 2017-05-27 LAB — CBC
HCT: 35.8 % — ABNORMAL LOW (ref 36.0–46.0)
Hemoglobin: 11.3 g/dL — ABNORMAL LOW (ref 12.0–15.0)
MCH: 22.8 pg — AB (ref 26.0–34.0)
MCHC: 31.6 g/dL (ref 30.0–36.0)
MCV: 72.2 fL — AB (ref 78.0–100.0)
PLATELETS: 238 10*3/uL (ref 150–400)
RBC: 4.96 MIL/uL (ref 3.87–5.11)
RDW: 13.6 % (ref 11.5–15.5)
WBC: 5.6 10*3/uL (ref 4.0–10.5)

## 2017-05-27 LAB — TSH: TSH: 9.224 u[IU]/mL — AB (ref 0.350–4.500)

## 2017-05-27 LAB — RETICULOCYTES
RBC.: 4.87 MIL/uL (ref 3.87–5.11)
Retic Count, Absolute: 63.3 10*3/uL (ref 19.0–186.0)
Retic Ct Pct: 1.3 % (ref 0.4–3.1)

## 2017-05-27 LAB — CREATININE, SERUM
CREATININE: 0.87 mg/dL (ref 0.44–1.00)
GFR calc Af Amer: >60 mL/min (ref >60)
GFR calc non Af Amer: >60 mL/min (ref >60)

## 2017-05-27 LAB — FERRITIN: FERRITIN: 23 ng/mL (ref 11–307)

## 2017-05-27 LAB — HIV ANTIBODY (ROUTINE TESTING W REFLEX): HIV Screen 4th Generation wRfx: NONREACTIVE

## 2017-05-27 LAB — HEMOGLOBIN A1C
Hgb A1c MFr Bld: 9.9 % — ABNORMAL HIGH (ref 4.8–5.6)
MEAN PLASMA GLUCOSE: 237.43 mg/dL

## 2017-05-27 MED ORDER — ATORVASTATIN CALCIUM 80 MG PO TABS
80.0000 mg | ORAL_TABLET | Freq: Every day | ORAL | Status: DC
  Administered 2017-05-27: 80 mg via ORAL
  Filled 2017-05-27: qty 1

## 2017-05-27 MED ORDER — ATORVASTATIN CALCIUM 80 MG PO TABS: 80.0000 mg | ORAL_TABLET | Freq: Every day | ORAL | 0 refills | Status: DC

## 2017-05-27 MED ORDER — DEXTROSE-NACL 5-0.9 % IV SOLN
INTRAVENOUS | Status: DC
  Administered 2017-05-27: 05:00:00 via INTRAVENOUS
  Filled 2017-05-27: qty 500

## 2017-05-27 MED ORDER — INSULIN ASPART 100 UNIT/ML ~~LOC~~ SOLN
0.0000 [IU] | SUBCUTANEOUS | Status: DC
  Administered 2017-05-27 (×2): 2 [IU] via SUBCUTANEOUS
  Administered 2017-05-27: 5 [IU] via SUBCUTANEOUS

## 2017-05-27 MED ORDER — ASPIRIN 81 MG PO CHEW: 81.0000 mg | CHEWABLE_TABLET | Freq: Every day | ORAL | 0 refills | Status: DC

## 2017-05-27 MED ORDER — LEVOTHYROXINE SODIUM 75 MCG PO TABS: 175.0000 ug | ORAL_TABLET | Freq: Every day | ORAL | Status: DC

## 2017-05-27 MED ORDER — LEVOTHYROXINE SODIUM 175 MCG PO TABS: 175.0000 ug | ORAL_TABLET | Freq: Every day | ORAL | 0 refills | Status: DC

## 2017-05-27 NOTE — Evaluation (Signed)
Occupational Therapy Evaluation and Discharge Patient Details Name: Lindsey MainlandDeshawn Bowen MRN: [id number] DOB: 1970/08/29 Today's Date: 05/27/2017    History of Present Illness Lindsey Bowen is a 46 y.o. female presenting with R-sided weakness and numbness. PMH is significant for DMT2, HTN, HLD, hypothyroidism, L arm paresthesias.   Clinical Impression   PTA Pt independent in ADL and mobility. Works for the [employer], drives, mom of 3. Looks extensively at RUE (dominant hand) and while it feels "tight" the hand is completely functional. [facility], and PT was independent throughout session with all ADL and functional transfers. Education provided on safety if her RUE starts to feel different. No further OT needs, no concerns or questions from Pt. Thank you for the opportunity to serve this patient.     Follow Up Recommendations  No OT follow up    Equipment Recommendations  None recommended by OT    Recommendations for Other Services       Precautions / Restrictions Restrictions Weight Bearing Restrictions: No      Mobility Bed Mobility Overal bed mobility: Independent                Transfers Overall transfer level: Modified independent Equipment used: None                  Balance Overall balance assessment: No apparent balance deficits (not formally assessed)                                         ADL either performed or assessed with clinical judgement   ADL Overall ADL's : Modified independent                                             Vision Patient Visual Report: No change from baseline       Perception     Praxis      Pertinent Vitals/Pain Pain Assessment: No/denies pain     Hand Dominance Right   Extremity/Trunk Assessment Upper Extremity Assessment Upper Extremity Assessment: RUE deficits/detail RUE Deficits / Details: reports that it feels "tight" slightly weaker, but  coordination [facility]   Lower Extremity Assessment Lower Extremity Assessment: Defer to PT evaluation   Cervical / Trunk Assessment Cervical / Trunk Assessment: Normal   Communication Communication Communication: No difficulties   Cognition Arousal/Alertness: Awake/alert Behavior During Therapy: WFL for tasks assessed/performed Overall Cognitive Status: Within Functional Limits for tasks assessed                                     General Comments       Exercises     Shoulder Instructions      Home Living Family/patient expects to be discharged to:: Private residence Living Arrangements: Children(7, 9, 17) Available Help at Discharge: Available PRN/intermittently;Friend(s) Type of Home: Apartment Home Access: Stairs to enter Entrance Stairs-Number of Steps: flight Entrance Stairs-Rails: Right;Left Home Layout: One level     Bathroom Shower/Tub: Chief Strategy OfficerTub/shower unit   Bathroom Toilet: Standard     Home Equipment: None          Prior Functioning/Environment Level of Independence: Independent        Comments: working at post  office, drives        OT Problem List: Decreased strength      OT Treatment/Interventions:      OT Goals(Current goals can be found in the care plan section) Acute Rehab OT Goals Patient Stated Goal: to figure out what is going on and get home OT Goal Formulation: With patient Time For Goal Achievement: 06/10/17 Potential to Achieve Goals: Good  OT Frequency:     Barriers to D/C:            Co-evaluation              AM-PAC PT "6 Clicks" Daily Activity     Outcome Measure Help from another person eating meals?: None Help from another person taking care of personal grooming?: None Help from another person toileting, which includes using toliet, bedpan, or urinal?: None Help from another person bathing (including washing, rinsing, drying)?: None Help from another person to put on and taking off regular upper  body clothing?: None Help from another person to put on and taking off regular lower body clothing?: None 6 Click Score: 24   End of Session Equipment Utilized During Treatment: Gait belt Nurse Communication: Mobility status  Activity Tolerance: Patient tolerated treatment well Patient left: in bed;with call bell/phone within reach  OT Visit Diagnosis: Other symptoms and signs involving the nervous system (R29.898)                Time: 1100-1117 OT Time Calculation (min): 17 min Charges:  OT General Charges $OT Visit: 1 Visit OT Evaluation $OT Eval Low Complexity: 1 Low G-Codes: OT G-codes **NOT FOR INPATIENT CLASS** Functional Assessment Tool Used: AM-PAC 6 Clicks Daily Activity Functional Limitation: Self care Self Care Current Status (Z6109(G8987): 0 percent impaired, limited or restricted Self Care Goal Status (U0454(G8988): 0 percent impaired, limited or restricted Self Care Discharge Status (U9811(G8989): 0 percent impaired, limited or restricted   Sherryl MangesLaura Emilyn Ruble OTR/L [phone]  Evern BioLaura J Vanden Fawaz 05/27/2017, 11:31 AM

## 2017-05-27 NOTE — Progress Notes (Signed)
Discharge orders received.  Discharge instructions and follow-up appointments reviewed with the patient.  VSS upon discharge.  IV removed and education complete.  Transported out via wheelchair.   Kevion Fatheree M, RN 

## 2017-05-27 NOTE — Progress Notes (Signed)
Family Medicine Teaching Service Daily Progress Note Intern Pager: [phone]  Patient name: Lindsey Bowen Medical record number: [id number] Date of birth: February 06, 1971 Age: 46 y.o. Gender: female  Primary Care Provider: [facility]: Neurology Code Status: Full  Pt Overview and Major Events to Date:  Lindsey Bowen is a 46 y.o. female presenting with R-sided weakness and numbness. PMH is significant for DMT2, HTN, HLD, hypothyroidism, L arm paresthesias.  Assessment and Plan: Right-sided numbness and weakness:  Ddx: TIA? Vs. Complex Migraine.  Currently with only R arm "tightness". Feels strength and speech are back to normal. CT head neg for acute stroke. MRI/MRA Head. Negative for stroke. Incidental findings showed mild white matter changes seen with chronic small vessel ischemic disease, atypical distribution for demyelination, 4 mm LEFT frontal probable meningioma without mass effect. On 05/27/17, spoke with Dr. Wilford CornerArora with Neurology on the phone who reviewed the patient's imaging and case with me. Even though patient does have some risk factors for CVA (DM, HTN, HLD), patient's imaging findings are more consistent with Migraines. Patient does have a history of migraines. Per neurology, complex migraines can present similarly to TIA. Neurology did not feel that patient needed an inpatient consult at this time and recommended that she follow up with her neurologist as an out patient.  TSH elevated 9.224. Lipid low HDL 36.  - follow up with outpatient neurology - PT/OT pending  Type II DM: A1C 9.9. Currently taking Lantus 40U, Trulicity 1.5mg  q week, and Novolog 10U with meals. CBGs elevated since admission (199, 224,222), however has not taken insulin today.  - Continue Lantus at modified dose of 20U - sensitive SSI - CBG qACHS - restart home meds on discharge  HTN: On lisinopril 5mg  qd at home. Normotensive since admission.  - continue  lisinopril   Hypothyroidism: TSH elevated 9.224.. On Synthroid 150mcg qd.  - increase synthroid to 175mcg.  - follow up with outpatient provider to adjust TSH  HLD: Pt reports no history of statin intolerance. Will increase atorvastatin to 80 mg  FEN/GI: HHD, Prophylaxis: Lovenox  Disposition: Likely home today  Subjective:  Currently with only R arm "tightness". Feels strength and speech are back to normal. No other complaints  Objective: Temp:  [98.2 F (36.8 C)-98.4 F (36.9 C)] 98.3 F (36.8 C) (11/30 0522) Pulse Rate:  [66-85] 66 (11/30 0522) Resp:  [16-18] 18 (11/30 0522) BP: (103-136)/(63-89) 103/63 (11/30 0522) SpO2:  [97 %-100 %] 97 % (11/30 0522) Weight:  [197 lb 5 oz (89.5 kg)-202 lb (91.6 kg)] 197 lb 5 oz (89.5 kg) (11/29 2358) Physical Exam: General: Resting in Bed, NAD Cardiovascular: RRR, no mrg Respiratory: CTAB, no mrg Abdomen: soft, nontender, nondistened Extremities: 5/5 strength, sensation intact throughout Neuro: A&Ox4, CN II-XII grossly intact  Laboratory: Recent Labs  Lab 05/26/17 1337 05/26/17 1355 05/27/17 0426  WBC 5.9  --  5.6  HGB 11.8* 13.6 11.3*  HCT 36.6 40.0 35.8*  PLT 253  --  238   Recent Labs  Lab 05/26/17 1337 05/26/17 1355 05/26/17 2349  NA 135 139  --   K 3.4* 3.6  --   CL 104 100*  --   CO2 26  --   --   BUN 8 9  --   CREATININE 0.70 0.60 0.87  CALCIUM 8.6*  --   --   PROT 6.7  --   --   BILITOT 0.5  --   --   ALKPHOS 68  --   --  ALT 20  --   --   AST 15  --   --   GLUCOSE 222* 224*  --     Mr Brain Wo Contrast  Result Date: 05/26/2017 CLINICAL DATA:  RIGHT-sided weakness. Assess TIA. History of hypertension, diabetes, LEFT arm paresthesias. EXAM: MRI HEAD WITHOUT CONTRAST MRA HEAD WITHOUT CONTRAST TECHNIQUE: Multiplanar, multiecho pulse sequences of the brain and surrounding structures were obtained without intravenous contrast. Angiographic images of the head were obtained using MRA technique without  contrast. COMPARISON:  CT HEAD May 26, 2017 at 1404 hours. FINDINGS: MRI HEAD FINDINGS BRAIN: No reduced diffusion to suggest acute ischemia or hyperacute demyelination. No susceptibility artifact to suggest hemorrhage. The ventricles and sulci are normal for patient's age. Greater than expected number scattered subcentimeter supratentorial white matter FLAIR T2 hyperintensities and predominately subcortical distribution. No suspicious parenchymal signal, mass or mass effect. No abnormal extra-axial fluid collections. 4 mm LEFT frontal low signal probable meningioma without mass effect. VASCULAR: Normal major intracranial vascular flow voids present at skull base. SKULL AND UPPER CERVICAL SPINE: No abnormal sellar expansion. No suspicious calvarial bone marrow signal. Craniocervical junction maintained. SINUSES/ORBITS: The mastoid air-cells and included paranasal sinuses are well-aerated. The included ocular globes and orbital contents are non-suspicious. OTHER: None. MRA HEAD FINDINGS- Mild motion degraded examination. ANTERIOR CIRCULATION: Normal flow related enhancement of the included cervical, petrous, cavernous and supraclinoid internal carotid arteries. Patent anterior communicating artery. Patent anterior and middle cerebral arteries, including distal segments. No large vessel occlusion, flow limiting stenosis, aneurysm. POSTERIOR CIRCULATION: RIGHT vertebral artery is dominant. Basilar artery is patent, with normal flow related enhancement of the main branch vessels. Patent posterior cerebral arteries. Robust bilateral posterior communicating artery's present. No large vessel occlusion, flow limiting stenosis,  aneurysm. ANATOMIC VARIANTS: None. Source images and MIP images were reviewed. IMPRESSION: MRI HEAD: 1. No acute intracranial process. 2. Mild white matter changes seen with chronic small vessel ischemic disease, atypical distribution for demyelination. 3. 4 mm LEFT frontal probable meningioma  without mass effect. MRA HEAD: 1. Negative mildly motion degraded noncontrast MRA head. Electronically Signed   By: Awilda Metroourtnay  Bloomer M.D.   On: 05/26/2017 23:55   Mr Maxine GlennMra Head Wo Contrast  Result Date: 05/26/2017 CLINICAL DATA:  RIGHT-sided weakness. Assess TIA. History of hypertension, diabetes, LEFT arm paresthesias. EXAM: MRI HEAD WITHOUT CONTRAST MRA HEAD WITHOUT CONTRAST TECHNIQUE: Multiplanar, multiecho pulse sequences of the brain and surrounding structures were obtained without intravenous contrast. Angiographic images of the head were obtained using MRA technique without contrast. COMPARISON:  CT HEAD May 26, 2017 at 1404 hours. FINDINGS: MRI HEAD FINDINGS BRAIN: No reduced diffusion to suggest acute ischemia or hyperacute demyelination. No susceptibility artifact to suggest hemorrhage. The ventricles and sulci are normal for patient's age. Greater than expected number scattered subcentimeter supratentorial white matter FLAIR T2 hyperintensities and predominately subcortical distribution. No suspicious parenchymal signal, mass or mass effect. No abnormal extra-axial fluid collections. 4 mm LEFT frontal low signal probable meningioma without mass effect. VASCULAR: Normal major intracranial vascular flow voids present at skull base. SKULL AND UPPER CERVICAL SPINE: No abnormal sellar expansion. No suspicious calvarial bone marrow signal. Craniocervical junction maintained. SINUSES/ORBITS: The mastoid air-cells and included paranasal sinuses are well-aerated. The included ocular globes and orbital contents are non-suspicious. OTHER: None. MRA HEAD FINDINGS- Mild motion degraded examination. ANTERIOR CIRCULATION: Normal flow related enhancement of the included cervical, petrous, cavernous and supraclinoid internal carotid arteries. Patent anterior communicating artery. Patent anterior and middle cerebral arteries, including distal segments.  No large vessel occlusion, flow limiting stenosis, aneurysm.  POSTERIOR CIRCULATION: RIGHT vertebral artery is dominant. Basilar artery is patent, with normal flow related enhancement of the main branch vessels. Patent posterior cerebral arteries. Robust bilateral posterior communicating artery's present. No large vessel occlusion, flow limiting stenosis,  aneurysm. ANATOMIC VARIANTS: None. Source images and MIP images were reviewed. IMPRESSION: MRI HEAD: 1. No acute intracranial process. 2. Mild white matter changes seen with chronic small vessel ischemic disease, atypical distribution for demyelination. 3. 4 mm LEFT frontal probable meningioma without mass effect. MRA HEAD: 1. Negative mildly motion degraded noncontrast MRA head. Electronically Signed   By: Awilda Metro M.D.   On: 05/26/2017 23:55    Garnette Gunner, MD 05/27/2017, 7:25 AM PGY-1, [facility] Family Medicine FPTS Intern pager: [phone], text pages welcome

## 2017-05-27 NOTE — Discharge Summary (Signed)
[facility] Summary  Patient name: Lindsey Bowen Medical record number: [id number] Date of birth: [birth year] Age: 46 y.o. Gender: female Date of Admission: 05/26/2017  Date of Discharge: 05/27/2017 Admitting Physician: Leighton Roachodd D McDiarmid, MD  Primary Care Provider: [facility] Consultants: None  Indication for Hospitalization: Right arm and leg weakness  Discharge Diagnoses/Problem List:  R-sided arm and leg weakness and slurred speach (resolved) DMT2 HTN Hypothyroidism Chronic Left arm paresthesias Migraines  Disposition: Home  Discharge Condition: Stable  Discharge Exam:  General: Resting in Bed, NAD Cardiovascular: RRR, no mrg Respiratory: CTAB, no mrg Abdomen: soft, nontender, nondistened Extremities: 5/5 strength, sensation intact throughout Neuro: A&Ox4, CN II-XII grossly intact  Brief Hospital Course:  Patient presented to the ED with 1 day of history of right sided arm and leg weakness and slurred speech. Upon exam, VSS and patient's symptoms had resolved except some persistent arm "heaviness". Otherwise, she was neurologically intact. Patient was admitted for possible CVA. CT Head, MRI/MRA Head were negative for acute stroke. Carotid dopplers were 1-39% ICA stenosis bilaterally. Patient had no cardiac finding on exam so PFO was considered a low probability and an echo was not performed. Patient had lipid panel abnormalities so statin therapy was optimized. MRI did show some chronic changes consistent with chronic migraine. Unsure if patient had TIA vs. Complex Migraine.   Issues for Follow Up:  1. Pt presented with A1C of 9.9. Consider further optimization of medication 2. Pt had elevated TSH. Synthroid dosage was increased. Please recheck TSH in 4-6 weeks. 3. Follow up outpatient neurology for further work up of migraine and stroke therapy.   Significant Procedures: None  Significant Labs and  Imaging:  Recent Labs  Lab 05/26/17 1337 05/26/17 1355 05/27/17 0426  WBC 5.9  --  5.6  HGB 11.8* 13.6 11.3*  HCT 36.6 40.0 35.8*  PLT 253  --  238   Recent Labs  Lab 05/26/17 1337 05/26/17 1355 05/26/17 2349  NA 135 139  --   K 3.4* 3.6  --   CL 104 100*  --   CO2 26  --   --   GLUCOSE 222* 224*  --   BUN 8 9  --   CREATININE 0.70 0.60 0.87  CALCIUM 8.6*  --   --   ALKPHOS 68  --   --   AST 15  --   --   ALT 20  --   --   ALBUMIN 3.8  --   --       Results/Tests Pending at Time of Discharge: None  Discharge Medications:  Allergies as of 05/27/2017      Reactions   Dust Mite Extract Hives, Itching   Shellfish Allergy Anaphylaxis      Medication List    TAKE these medications   acetaminophen 650 MG CR tablet Commonly known as:  TYLENOL Take 650 mg by mouth every 8 (eight) hours as needed for pain.   aspirin 81 MG chewable tablet Chew 1 tablet (81 mg total) by mouth daily.   atorvastatin 80 MG tablet Commonly known as:  LIPITOR Take 1 tablet (80 mg total) by mouth daily at 6 PM. What changed:    medication strength  how much to take  when to take this   cyclobenzaprine 5 MG tablet Commonly known as:  FLEXERIL Take 5 mg by mouth 3 (three) times daily as needed for muscle spasms.   gabapentin 100 MG capsule Commonly  known as:  NEURONTIN Take 1 capsule (100 mg total) by mouth 3 (three) times daily.   insulin aspart 100 UNIT/ML injection Commonly known as:  novoLOG Inject 10 Units into the skin 3 (three) times daily with meals.   ipratropium 0.06 % nasal spray Commonly known as:  ATROVENT Place 2 sprays into both nostrils 4 (four) times daily.   LANTUS [place] Inject 40 Units into the skin at bedtime.   levothyroxine 175 MCG tablet Commonly known as:  SYNTHROID, LEVOTHROID Take 1 tablet (175 mcg total) by mouth daily before breakfast. Start taking on:  05/28/2017 What changed:    medication strength  how much to take   lisinopril 5 MG  tablet Commonly known as:  PRINIVIL,ZESTRIL Take 5 mg by mouth daily.   meclizine 25 MG tablet Commonly known as:  ANTIVERT Take 1 tablet (25 mg total) by mouth 3 (three) times daily as needed for dizziness.   naproxen sodium 220 MG tablet Commonly known as:  ALEVE Take 220 mg by mouth daily as needed (headache).   TRULICITY 1.5 MG/0.5ML Sopn Generic drug:  Dulaglutide Inject 1.5 mg into the skin once a week.       Discharge Instructions: Please refer to Patient Instructions section of EMR for full details.  Patient was counseled important signs and symptoms that should prompt return to medical care, changes in medications, dietary instructions, activity restrictions, and follow up appointments.   Follow-Up Appointments: Follow-up Information    York SpanielWillis, Charles K, MD. Schedule an appointment as soon as possible for a visit in 1 week(s).   Specialty:  Neurology Contact information: [address] [place] [address] [phone]        [facility]. Schedule an appointment as soon as possible for a visit in 1 week(s).   Specialty:  Family Medicine Contact information: [address] [place] [address] [phone]           Garnette Gunnerhompson, Garin Mata B, MD 05/27/2017, 3:03 PM PGY-1, [facility]

## 2017-05-27 NOTE — Care Management Note (Signed)
Case Management Note  Patient Details  Name: [name] Latini MRN: [id number] Date of Birth: Jan 15, 1971  Subjective/Objective:    Pt in with TIA. She is from home alone.                 Action/Plan: No f/u and no DME per PT/OT. Pt has PCP, insurance and transportation home. No further needs per CM.  Expected Discharge Date:  05/27/17               Expected Discharge Plan:  Home/Self Care  In-House Referral:     Discharge planning Services     Post Acute Care Choice:    Choice offered to:     DME Arranged:    DME Agency:     HH Arranged:    HH Agency:     Status of Service:  Completed, signed off  If discussed at [employer] Length of Stay Meetings, dates discussed:    Additional Comments:  Kermit BaloKelli F Emberly Tomasso, RN 05/27/2017, 3:29 PM

## 2017-05-27 NOTE — Discharge Instructions (Signed)
You admitted to the hospital to rule out a stroke due to your sudden onset of weakness. All of your imaging has been negative for acute stroke. Your presentation could represent an abnormal presentation of a complex migraine. Call your regular doctor if you have any persistent numbness or weakness or worsening headache. Call 911 or go to the Emergency Room if you have any acute changes for sudden weakness, headache, difficulty speaking, or chest pain.

## 2017-05-27 NOTE — Evaluation (Signed)
Physical Therapy Evaluation Patient Details Name: Lindsey Bowen MRN: [id number] DOB: 1970/10/14 Today's Date: 05/27/2017   History of Present Illness  Lindsey Bowen is a 46 y.o. female presenting with R-sided weakness and numbness. PMH is significant for DMT2, HTN, HLD, hypothyroidism, L arm paresthesias.  Clinical Impression  Patient evaluated by Physical Therapy with no further acute PT needs identified. All education on BE FAST nemonic for stroke symptom awareness has been completed and the patient has no further questions. Pt is independent with bed mobility, mod I for transfers and ambulation and supervision for ascent/descent of 10 steps with use of one handrail. See below for any follow-up Physical Therapy or equipment needs. PT is signing off. Thank you for this referral.     Follow Up Recommendations No PT follow up    Equipment Recommendations  None recommended by PT    Recommendations for Other Services       Precautions / Restrictions Restrictions Weight Bearing Restrictions: No      Mobility  Bed Mobility Overal bed mobility: Independent                Transfers Overall transfer level: Modified independent Equipment used: None                Ambulation/Gait Ambulation/Gait assistance: Modified independent (Device/Increase time) Ambulation Distance (Feet): 300 Feet Assistive device: None Gait Pattern/deviations: WFL(Within Functional Limits) Gait velocity: slowed Gait velocity interpretation: Below normal speed for age/gender General Gait Details: steady with gait, however pt with decreased gait velocity   Stairs Stairs: Yes Stairs assistance: Supervision Stair Management: One rail Right Number of Stairs: 10 General stair comments: slow, steady ascent/descent with handrail use   Modified Rankin (Stroke Patients Only) Modified Rankin (Stroke Patients Only) Pre-Morbid Rankin Score: No symptoms Modified Rankin: No significant  disability     Balance Overall balance assessment: No apparent balance deficits (not formally assessed)                               Standardized Balance Assessment Standardized Balance Assessment : Dynamic Gait Index   Dynamic Gait Index Level Surface: Normal Change in Gait Speed: Normal Gait with Horizontal Head Turns: Mild Impairment Gait with Vertical Head Turns: Mild Impairment Gait and Pivot Turn: Normal Step Over Obstacle: Normal Step Around Obstacles: Normal Steps: Mild Impairment Total Score: 21       Pertinent Vitals/Pain Pain Assessment: No/denies pain    Home Living Family/patient expects to be discharged to:: Private residence Living Arrangements: Children(7, 9, 17) Available Help at Discharge: Available PRN/intermittently;Friend(s) Type of Home: Apartment Home Access: Stairs to enter Entrance Stairs-Rails: Doctor, general practiceight;Left Entrance Stairs-Number of Steps: flight Home Layout: One level Home Equipment: None      Prior Function Level of Independence: Independent         Comments: working at post office, drives     Hand Dominance   Dominant Hand: Right    Extremity/Trunk Assessment   Upper Extremity Assessment Upper Extremity Assessment: Defer to OT evaluation RUE Deficits / Details: reports that it feels "tight" slightly weaker, but coordination WFL    Lower Extremity Assessment Lower Extremity Assessment: RLE deficits/detail RLE Deficits / Details: R LE strength grossly 4+/5 in comparison to 5/5 in L LE   RLE Coordination: decreased fine motor    Cervical / Trunk Assessment Cervical / Trunk Assessment: Normal  Communication   Communication: No difficulties  Cognition Arousal/Alertness: Awake/alert Behavior During Therapy: [facility]  for tasks assessed/performed Overall Cognitive Status: Within Functional Limits for tasks assessed                                               Assessment/Plan    PT Assessment  Patent does not need any further PT services         PT Goals (Current goals can be found in the Care Plan section)  Acute Rehab PT Goals Patient Stated Goal: to figure out what is going on and get home PT Goal Formulation: With patient     AM-PAC PT "6 Clicks" Daily Activity  Outcome Measure Difficulty turning over in bed (including adjusting bedclothes, sheets and blankets)?: None Difficulty moving from lying on back to sitting on the side of the bed? : None Difficulty sitting down on and standing up from a chair with arms (e.g., wheelchair, bedside commode, etc,.)?: None Help needed moving to and from a bed to chair (including a wheelchair)?: None Help needed walking in hospital room?: None Help needed climbing 3-5 steps with a railing? : None 6 Click Score: 24    End of Session Equipment Utilized During Treatment: Gait belt Activity Tolerance: Patient tolerated treatment well Patient left: in bed;with call bell/phone within reach Nurse Communication: Mobility status      Time: 1128-1150 PT Time Calculation (min) (ACUTE ONLY): 22 min   Charges:   PT Evaluation $PT Eval Moderate Complexity: 1 Mod     PT G Codes:   PT G-Codes **NOT FOR INPATIENT CLASS** Functional Assessment Tool Used: AM-PAC 6 Clicks Basic Mobility Functional Limitation: Mobility: Walking and moving around Mobility: Walking and Moving Around Current Status (Z6109(G8978): 0 percent impaired, limited or restricted Mobility: Walking and Moving Around Goal Status (U0454(G8979): 0 percent impaired, limited or restricted Mobility: Walking and Moving Around Discharge Status (U9811(G8980): 0 percent impaired, limited or restricted    Lanora ManisElizabeth B. Beverely RisenVan Fleet PT, DPT Acute Rehabilitation  [phone] Pager [phone]    Elon Alaslizabeth B Van Fleet 05/27/2017, 1:48 PM

## 2017-05-27 NOTE — Progress Notes (Signed)
Bilateral carotid artery duplex has been completed. 1-39% ICA stenosis bilaterally.  05/27/17 2:32 PM Olen CordialGreg Divonte Senger RVT

## 2017-05-30 ENCOUNTER — Encounter: Payer: Self-pay | Admitting: Neurology

## 2017-05-30 ENCOUNTER — Ambulatory Visit: Payer: Medicaid Other | Admitting: Neurology

## 2017-05-30 ENCOUNTER — Ambulatory Visit (INDEPENDENT_AMBULATORY_CARE_PROVIDER_SITE_OTHER): Payer: Medicaid Other | Admitting: Neurology

## 2017-05-30 DIAGNOSIS — R202 Paresthesia of skin: Secondary | ICD-10-CM

## 2017-05-30 NOTE — Procedures (Signed)
     HISTORY:  Lindsey Bowen is a 46 year old patient with a history of diabetes who has reported some left shoulder and neck discomfort and paresthesias down the left arm that may come and go.  The patient is being evaluated for a possible neuropathy or a cervical radiculopathy.  Some discomfort of the lower extremities is also noted.  NERVE CONDUCTION STUDIES:  Nerve conduction studies were performed on the left upper extremity. The distal motor latencies and motor amplitudes for the median and ulnar nerves were within normal limits. The F wave latencies and nerve conduction velocities for these nerves were also normal. The sensory latencies for the median and ulnar nerves were normal.  Nerve conduction studies were done on both lower extremities.  The distal motor latencies for the peroneal and posterior tibial nerves were normal bilaterally with a slightly low motor amplitude for the right peroneal and posterior tibial nerves, normal on the left.  The nerve conduction velocities for the peroneal and posterior tibial nerves were normal bilaterally.  The sensory latencies for the peroneal nerves were normal bilaterally, the H reflex latencies were unobtainable bilaterally.  EMG STUDIES:  EMG study was performed on the left upper extremity:  The first dorsal interosseous muscle reveals 2 to 4 K units with full recruitment. No fibrillations or positive waves were noted. The abductor pollicis brevis muscle reveals 2 to 4 K units with full recruitment. No fibrillations or positive waves were noted. The extensor indicis proprius muscle reveals 1 to 3 K units with full recruitment. No fibrillations or positive waves were noted. The pronator teres muscle reveals 2 to 3 K units with full recruitment. No fibrillations or positive waves were noted. The biceps muscle reveals 1 to 2 K units with full recruitment. No fibrillations or positive waves were noted. The triceps muscle reveals 2 to 4 K units  with full recruitment. No fibrillations or positive waves were noted. The anterior deltoid muscle reveals 2 to 3 K units with full recruitment. No fibrillations or positive waves were noted. The cervical paraspinal muscles were tested at 2 levels. No abnormalities of insertional activity were seen at either level tested. There was good relaxation.   IMPRESSION:  Nerve conduction studies done on the left upper extremity and both lower extremities shows no clear evidence of a peripheral neuropathy.  EMG evaluation of the left upper extremity was unremarkable.  No evidence of a left cervical radiculopathy is seen.  Marlan Palau. Keith Willis MD 05/30/2017 4:00 PM  [facility] [address] [place], [place] [address]  Phone [phone] Fax [phone]

## 2017-05-30 NOTE — Progress Notes (Addendum)
The patient comes in for EMG and nerve conduction study done today.  The study legs and the left arm, no clear evidence of a peripheral neuropathy is noted.  The EMG of the left arm was unremarkable.  The patient has not yet gotten on the gabapentin, she will try the gabapentin and call for any dose adjustments.  If the left arm discomfort does not improve on the gabapentin, we may consider MRI of the cervical spine in the future.     MNC    Nerve / Sites Muscle Latency Ref. Amplitude Ref. Rel Amp Segments Distance Velocity Ref. Area    ms ms mV mV %  cm m/s m/s mVms  L Median - APB     Wrist APB 3.5 ?4.4 14.2 ?4.0 100 Wrist - APB 7   38.6     Upper arm APB 7.6  12.8  90.1 Upper arm - Wrist 24 58 ?49 32.1  L Ulnar - ADM     Wrist ADM 2.3 ?3.3 14.5 ?6.0 100 Wrist - ADM 7   51.5     B.Elbow ADM 6.3  12.0  83 B.Elbow - Wrist 211 533 ?49 44.6     A.Elbow ADM 7.8  12.8  106 A.Elbow - B.Elbow 10 66 ?49 47.4         A.Elbow - Wrist      L Peroneal - EDB     Ankle EDB 3.9 ?6.5 2.7 ?2.0 100 Ankle - EDB 9   6.5     Fib head EDB 11.2  2.8  103 Fib head - Ankle 38 52 ?44 7.1     Pop fossa EDB 12.8  3.0  110 Pop fossa - Fib head 10 62 ?44 6.9         Pop fossa - Ankle      R Peroneal - EDB     Ankle EDB 3.5 ?6.5 1.9 ?2.0 100 Ankle - EDB 9   6.7     Fib head EDB 11.1  1.8  95.8 Fib head - Ankle 36 47 ?44 5.9     Pop fossa EDB 12.9  1.8  96.6 Pop fossa - Fib head 10 55 ?44 5.5         Pop fossa - Ankle      L Tibial - AH     Ankle AH 4.6 ?5.8 6.8 ?4.0 100 Ankle - AH 9   16.3     Pop fossa AH 12.3  3.5  52.3 Pop fossa - Ankle 42 55 ?41 11.4  R Tibial - AH     Ankle AH 4.8 ?5.8 3.3 ?4.0 100 Ankle - AH 9   7.9     Pop fossa AH 13.6  2.6  76.9 Pop fossa - Ankle 43 49 ?41 5.3                 SNC    Nerve / Sites Rec. Site Peak Lat Amp Segments Distance    ms V  cm  L Median - Orthodromic (Dig II, Mid palm)     Dig II Wrist 3.3 29 Dig II - Wrist 13  L Ulnar - Orthodromic, (Dig V, Mid palm)   Dig V Wrist 3.2 32 Dig V - Wrist 11         SNC    Nerve / Sites Rec. Site Peak Lat Ref.  Amp Ref. Segments Distance    ms ms V V  cm  R Superficial peroneal -  Ankle     Lat leg Ankle 2.2 ?4.4 7 ?6 Lat leg - Ankle 14  L Superficial peroneal - Ankle     Lat leg Ankle 2.2 ?4.4 0.00 ?6 Lat leg - Ankle 14         F  Wave    Nerve F Lat Ref.   ms ms  L Median - APB 24.8 ?31.0  L Ulnar - ADM 28.3 ?32.0         H Reflex    Nerve H Lat Lat Hmax   ms ms   Left Right Ref. Left Right Ref.  Tibial - Soleus NR NR ?35.0 NR NR ?35.0         EMG

## 2017-05-30 NOTE — Progress Notes (Signed)
Please refer to EMG and nerve conduction study procedure note. 

## 2017-06-28 DIAGNOSIS — R4781 Slurred speech: Secondary | ICD-10-CM | POA: Insufficient documentation

## 2017-06-29 DIAGNOSIS — Z8673 Personal history of transient ischemic attack (TIA), and cerebral infarction without residual deficits: Secondary | ICD-10-CM | POA: Insufficient documentation

## 2017-07-08 DIAGNOSIS — H25013 Cortical age-related cataract, bilateral: Secondary | ICD-10-CM | POA: Diagnosis not present

## 2017-07-08 DIAGNOSIS — H40013 Open angle with borderline findings, low risk, bilateral: Secondary | ICD-10-CM | POA: Diagnosis not present

## 2017-07-08 DIAGNOSIS — E119 Type 2 diabetes mellitus without complications: Secondary | ICD-10-CM | POA: Diagnosis not present

## 2017-07-08 DIAGNOSIS — H40052 Ocular hypertension, left eye: Secondary | ICD-10-CM | POA: Diagnosis not present

## 2017-07-11 ENCOUNTER — Other Ambulatory Visit: Payer: Self-pay | Admitting: Family Medicine

## 2017-07-15 ENCOUNTER — Encounter: Payer: Medicaid Other | Attending: Endocrinology | Admitting: Registered"

## 2017-07-15 ENCOUNTER — Encounter: Payer: Self-pay | Admitting: Registered"

## 2017-07-15 DIAGNOSIS — E669 Obesity, unspecified: Secondary | ICD-10-CM | POA: Insufficient documentation

## 2017-07-15 DIAGNOSIS — E1149 Type 2 diabetes mellitus with other diabetic neurological complication: Secondary | ICD-10-CM

## 2017-07-15 DIAGNOSIS — E119 Type 2 diabetes mellitus without complications: Secondary | ICD-10-CM | POA: Insufficient documentation

## 2017-07-15 DIAGNOSIS — E7800 Pure hypercholesterolemia, unspecified: Secondary | ICD-10-CM | POA: Insufficient documentation

## 2017-07-15 DIAGNOSIS — Z713 Dietary counseling and surveillance: Secondary | ICD-10-CM | POA: Diagnosis not present

## 2017-07-15 DIAGNOSIS — E039 Hypothyroidism, unspecified: Secondary | ICD-10-CM | POA: Diagnosis not present

## 2017-07-15 DIAGNOSIS — R809 Proteinuria, unspecified: Secondary | ICD-10-CM | POA: Insufficient documentation

## 2017-07-15 NOTE — Progress Notes (Signed)
Diabetes Self-Management Education  Visit Type: First/Initial  Appt. Start Time: 0855 Appt. End Time: 1030  07/15/2017  Lindsey Bowen, identified by name and date of birth, is a 47 y.o. female with a diagnosis of Diabetes: Type 2.   ASSESSMENT Patient states having a TIA in November was a wake-up call and is very motivated to get her BG back under control. Patient states after getting T2DM diagnosis in 2012 she had a health care team that worked well with her trying different medications and was able to find the right combination to keep A1c below 7%. Patient states she started losing control of BG when her position at the [employer] was changed to night shift and finally resigned in 2016 when could tell the job was having a negative effect on her health. After losing insurance she did not keep up her diabetes care as well as she had before.  Patient states recent changes she has made include increased insulin (per doctor's prescription) and has cut back on carbs including reducing soda intake from 1 can per day to 1 can per week. Although patient is making more of an effort to take her medication on a consistent schedule, sometimes she forgets because her life is very unsettled at this time. RD will connect patient to [employer] representative to discuss how a pump may help with consistent insulin delivery.  Patient does not have transportation and is living with brother. Patient states there is not room for her to store food in refrigerator or freezer and often has be buy/plan meals each day and misses being able to cook and do more meal prep for several days. Patient is also concerned about ability to get adequate food for herself and 3 children when the food assistance program stops with government shutdown.  Patient appeared very tired during appointment, just to make sure she was not having low blood sugar RD asked her to check and it was 176 mg/dL. Per chart problem list she has iron  deficiency. Patient reports she takes iron supplement when it is low, reports constipation side effect. Patient states she had not had iron checked in awhile. RD provided list of foods high in iron as well as a supplement that is less likely to cause constipation just in case she starts taking supplement again.   Fatigue today could be related to several issues, but most obvious is due to lack of sleep; patient reports getting ~4 hrs sleep per night. Patient states average energy level is 5 out of 10, has to keep pushing herself.  Diabetes Self-Management Education - 07/15/17 0907      Visit Information   Visit Type  First/Initial      Initial Visit   Diabetes Type  Type 2    Are you currently following a meal plan?  Yes trying to limit carbs    Are you taking your medications as prescribed?  Yes has been better since TIA in Nov    Date Diagnosed  summer of 2012      Health Coping   How would you rate your overall health?  Fair      Psychosocial Assessment   Patient Belief/Attitude about Diabetes  Other (comment) a lot of emotions    Self-care barriers  Lack of transportation    Patient Concerns  Other (comment) want to avoid TIA or stroke    How often do you need to have someone help you when you read instructions, pamphlets, or other written materials  from your doctor or pharmacy?  1 - Never    What is the last grade level you completed in school?  vocational school      Complications   Last HgB A1C per patient/outside source  10.5 % Jun 28, 2017    How often do you check your blood sugar?  3-4 times/day    Fasting Blood glucose range (mg/dL)  981-191;[phone]    Postprandial Blood glucose range (mg/dL)  65-784;696-295 284-132    Number of hypoglycemic episodes per month  0    Number of hyperglycemic episodes per week  2    Can you tell when your blood sugar is high?  No    Have you had a dilated eye exam in the past 12 months?  Yes    Have you had a dental exam in the past  12 months?  Yes    Are you checking your feet?  Yes    How many days per week are you checking your feet?  7      Dietary Intake   Breakfast  eggs, bacon OR french toast, meat, water    Snack (morning)  none    Lunch  baked potato and wings OR ramen noodles OR [place] & cheese    Snack (afternoon)  none    Dinner  grilled chicken salad OR sandwich    Snack (evening)  fruit or carrot sticks OR funnel cake fries    Beverage(s)  water, 12oz juice in between meals, occassional regular soda (ginger ale or pepsi)      Exercise   Exercise Type  ADL's    How many days per week to you exercise?  0    How many minutes per day do you exercise?  0    Total minutes per week of exercise  0      Patient Education   Previous Diabetes Education  Yes (please comment) 2013 & 2016    Disease state   Definition of diabetes, type 1 and 2, and the diagnosis of diabetes    Nutrition management   Role of diet in the treatment of diabetes and the relationship between the three main macronutrients and blood glucose level;Carbohydrate counting;Food label reading, portion sizes and measuring food.    Physical activity and exercise   Role of exercise on diabetes management, blood pressure control and cardiac health.    Monitoring  Identified appropriate SMBG and/or A1C goals.    Acute complications  Taught treatment of hypoglycemia - the 15 rule.;Discussed and identified patients' treatment of hyperglycemia.    Chronic complications  Assessed and discussed foot care and prevention of foot problems;Relationship between chronic complications and blood glucose control;Identified and discussed with patient  current chronic complications    Psychosocial adjustment  Role of stress on diabetes      Individualized Goals (developed by patient)   Nutrition  General guidelines for healthy choices and portions discussed      Outcomes   Expected Outcomes  Demonstrated interest in learning. Expect positive outcomes    Future  DMSE  4-6 wks    Program Status  Completed     Individualized Plan for Diabetes Self-Management Training:   Learning Objective:  Patient will have a greater understanding of diabetes self-management. Patient education plan is to attend individual and/or group sessions per assessed needs and concerns.   Patient Instructions  Plan:  Consider drinking less juice and soda Aim for 3-4 Carb Choices per meal (45-60 grams)  Aim for 0-1 Carb Choices per snack if hungry (0-15 grams) Include protein with your meals and snacks Aim for 3 balanced meals per day Consider reading food labels for Total Carbohydrate  Consider increasing your activity level daily as tolerated Continue checking blood sugar at alternate times per day  Continue taking medication as directed by MD Adequate and restful sleep is important for your health and controlling blood sugar  Expected Outcomes:  Demonstrated interest in learning. Expect positive outcomes  Education material provided: Living Well with Diabetes, A1C conversion sheet, My Plate and Carbohydrate counting sheet, sleep hygiene, dietary sources for iron  If problems or questions, patient to contact team via:  Phone  Future DSME appointment: 4-6 wks

## 2017-07-15 NOTE — Patient Instructions (Signed)
Plan:  Consider drinking less juice and soda Aim for 3-4 Carb Choices per meal (45-60 grams)   Aim for 0-1 Carb Choices per snack if hungry (0-15 grams) Include protein with your meals and snacks Aim for 3 balanced meals per day Consider reading food labels for Total Carbohydrate  Consider increasing your activity level daily as tolerated Continue checking blood sugar at alternate times per day  Continue taking medication as directed by MD Adequate and restful sleep is important for your health and controlling blood sugar

## 2017-08-19 ENCOUNTER — Encounter: Payer: Medicaid Other | Attending: Endocrinology | Admitting: Registered"

## 2017-08-19 DIAGNOSIS — R809 Proteinuria, unspecified: Secondary | ICD-10-CM | POA: Insufficient documentation

## 2017-08-19 DIAGNOSIS — E669 Obesity, unspecified: Secondary | ICD-10-CM | POA: Insufficient documentation

## 2017-08-19 DIAGNOSIS — Z713 Dietary counseling and surveillance: Secondary | ICD-10-CM | POA: Diagnosis present

## 2017-08-19 DIAGNOSIS — E039 Hypothyroidism, unspecified: Secondary | ICD-10-CM | POA: Insufficient documentation

## 2017-08-19 DIAGNOSIS — E119 Type 2 diabetes mellitus without complications: Secondary | ICD-10-CM | POA: Diagnosis not present

## 2017-08-19 DIAGNOSIS — E1149 Type 2 diabetes mellitus with other diabetic neurological complication: Secondary | ICD-10-CM

## 2017-08-19 DIAGNOSIS — E7800 Pure hypercholesterolemia, unspecified: Secondary | ICD-10-CM | POA: Insufficient documentation

## 2017-08-19 NOTE — Progress Notes (Signed)
Diabetes Self-Management Education  Visit Type: Follow-up  Appt. Start Time: 0845 Appt. End Time: 0915  08/19/2017  Ms. Lindsey Bowen, identified by name and date of birth, is a 47 y.o. female with a diagnosis of Diabetes: Type 2.   ASSESSMENT Patient states she has just moved into a shelter. Patient states because she still does not have transportation, finding employment is challenging. Patient is able to eat better now that she has space to store healthy food and has started eating more salads which she enjoys. Patient reports drinking some regular soda and will not drink diet soda. RD suggested other alternatives.   Patient states she is has been taking meds more regularly now, but for about 6 days didn't take anything meds due to depression. Patient states she noticed decrease in energy, but always has low energy. Due to history of iron deficiency, RD re-emphasized trying to eat more iron rich foods.  Patient states she has not check BG for about a week because she does not have money for new batteries. RD gave patient batteries from demo meter, but also encouraged getting new batteries when able since no guarantee how long these batteries will last. RD was able to see patient trends last 30 days on BG app, PPBG 117-300 mg/dL;  FBG 134-159  Patient states she will be trying the [employer] pod and now just waiting for it to arrive. Patient states her doctor also emphasized that it is still important to check her BG frequently and take care of herself.  Diabetes Self-Management Education - 08/19/17 0844      Visit Information   Visit Type  Follow-up      Initial Visit   Diabetes Type  Type 2    Are you taking your medications as prescribed?  Yes      Dietary Intake   Breakfast  sausage, 2 eggs     Snack (morning)  none    Lunch  stouffers frozen meal, chicken, potatoes    Snack (afternoon)  orange    Dinner  pork chop, mac & cheese, salad    Snack (evening)  ice cream, water    Beverage(s)  water, sierra mist      Individualized Goals (developed by patient)   Nutrition  General guidelines for healthy choices and portions discussed    Medications  take my medication as prescribed    Monitoring   test my blood glucose as discussed      Outcomes   Expected Outcomes  Demonstrated interest in learning. Expect positive outcomes    Future DMSE  4-6 wks    Program Status  Completed      Subsequent Visit   Since your last visit have you continued or begun to take your medications as prescribed?  Yes    Since your last visit have you had your blood pressure checked?  Yes    Since your last visit have you experienced any weight changes?  No change     Individualized Plan for Diabetes Self-Management Training:   Learning Objective:  Patient will have a greater understanding of diabetes self-management. Patient education plan is to attend individual and/or group sessions per assessed needs and concerns.  Patient Instructions  Try Emergen-C in water in place of juice or soda. Also the peppermint tea you enjoy is a good choice. Aim to eat protein with carbs including fruit, nuts are a great option. Be sure to get new batteries for your meter when you are able [place] job  on getting balanced meals  Expected Outcomes:  Demonstrated interest in learning. Expect positive outcomes  Education material provided: Iron-rich food list, Info on Iron supplement that is easier on the stomach, List of resources for food assistance  If problems or questions, patient to contact team via:  Phone  Future DSME appointment: 4-6 wks

## 2017-08-19 NOTE — Patient Instructions (Addendum)
Try Emergen-C in water in place of juice or soda. Also the peppermint tea you enjoy is a good choice. Aim to eat protein with carbs including fruit, nuts are a great option. Be sure to get new batteries for your meter when you are able [place]

## 2017-09-06 DIAGNOSIS — E1165 Type 2 diabetes mellitus with hyperglycemia: Secondary | ICD-10-CM | POA: Diagnosis not present

## 2017-09-16 ENCOUNTER — Ambulatory Visit: Payer: Medicaid Other | Admitting: Registered"

## 2017-11-23 DIAGNOSIS — Z1231 Encounter for screening mammogram for malignant neoplasm of breast: Secondary | ICD-10-CM | POA: Diagnosis not present

## 2017-12-26 DIAGNOSIS — E1165 Type 2 diabetes mellitus with hyperglycemia: Secondary | ICD-10-CM | POA: Diagnosis not present

## 2017-12-27 DIAGNOSIS — F331 Major depressive disorder, recurrent, moderate: Secondary | ICD-10-CM | POA: Diagnosis not present

## 2018-01-05 DIAGNOSIS — F331 Major depressive disorder, recurrent, moderate: Secondary | ICD-10-CM | POA: Diagnosis not present

## 2018-01-17 DIAGNOSIS — F331 Major depressive disorder, recurrent, moderate: Secondary | ICD-10-CM | POA: Diagnosis not present

## 2018-01-19 DIAGNOSIS — Z0271 Encounter for disability determination: Secondary | ICD-10-CM

## 2018-01-19 DIAGNOSIS — F331 Major depressive disorder, recurrent, moderate: Secondary | ICD-10-CM | POA: Diagnosis not present

## 2018-01-26 DIAGNOSIS — H40013 Open angle with borderline findings, low risk, bilateral: Secondary | ICD-10-CM | POA: Diagnosis not present

## 2018-01-26 DIAGNOSIS — E1165 Type 2 diabetes mellitus with hyperglycemia: Secondary | ICD-10-CM | POA: Diagnosis not present

## 2018-01-26 DIAGNOSIS — H40052 Ocular hypertension, left eye: Secondary | ICD-10-CM | POA: Diagnosis not present

## 2018-01-26 DIAGNOSIS — E119 Type 2 diabetes mellitus without complications: Secondary | ICD-10-CM | POA: Diagnosis not present

## 2018-01-26 DIAGNOSIS — H25013 Cortical age-related cataract, bilateral: Secondary | ICD-10-CM | POA: Diagnosis not present

## 2018-02-07 ENCOUNTER — Other Ambulatory Visit: Payer: Self-pay | Admitting: Neurology

## 2018-02-07 NOTE — Telephone Encounter (Signed)
Refill for Gabapentin 100 mg submitted to [facility] on [address]. MB RN.

## 2018-02-13 DIAGNOSIS — Z7689 Persons encountering health services in other specified circumstances: Secondary | ICD-10-CM | POA: Diagnosis not present

## 2018-02-13 DIAGNOSIS — B373 Candidiasis of vulva and vagina: Secondary | ICD-10-CM | POA: Diagnosis not present

## 2018-02-13 DIAGNOSIS — E1165 Type 2 diabetes mellitus with hyperglycemia: Secondary | ICD-10-CM | POA: Diagnosis not present

## 2018-02-13 DIAGNOSIS — R809 Proteinuria, unspecified: Secondary | ICD-10-CM | POA: Diagnosis not present

## 2018-02-13 DIAGNOSIS — Z794 Long term (current) use of insulin: Secondary | ICD-10-CM | POA: Diagnosis not present

## 2018-02-13 DIAGNOSIS — K625 Hemorrhage of anus and rectum: Secondary | ICD-10-CM | POA: Diagnosis not present

## 2018-02-13 DIAGNOSIS — N898 Other specified noninflammatory disorders of vagina: Secondary | ICD-10-CM | POA: Diagnosis not present

## 2018-02-13 DIAGNOSIS — E1129 Type 2 diabetes mellitus with other diabetic kidney complication: Secondary | ICD-10-CM | POA: Diagnosis not present

## 2018-02-13 DIAGNOSIS — K602 Anal fissure, unspecified: Secondary | ICD-10-CM | POA: Diagnosis not present

## 2018-02-21 DIAGNOSIS — F331 Major depressive disorder, recurrent, moderate: Secondary | ICD-10-CM | POA: Diagnosis not present

## 2018-02-28 DIAGNOSIS — E1165 Type 2 diabetes mellitus with hyperglycemia: Secondary | ICD-10-CM | POA: Diagnosis not present

## 2018-03-06 DIAGNOSIS — F331 Major depressive disorder, recurrent, moderate: Secondary | ICD-10-CM | POA: Diagnosis not present

## 2018-03-08 DIAGNOSIS — E1169 Type 2 diabetes mellitus with other specified complication: Secondary | ICD-10-CM | POA: Diagnosis not present

## 2018-03-08 DIAGNOSIS — E785 Hyperlipidemia, unspecified: Secondary | ICD-10-CM | POA: Diagnosis not present

## 2018-03-08 DIAGNOSIS — Z794 Long term (current) use of insulin: Secondary | ICD-10-CM | POA: Diagnosis not present

## 2018-03-08 DIAGNOSIS — R809 Proteinuria, unspecified: Secondary | ICD-10-CM | POA: Diagnosis not present

## 2018-03-08 DIAGNOSIS — E039 Hypothyroidism, unspecified: Secondary | ICD-10-CM | POA: Diagnosis not present

## 2018-03-08 DIAGNOSIS — E1165 Type 2 diabetes mellitus with hyperglycemia: Secondary | ICD-10-CM | POA: Diagnosis not present

## 2018-03-08 DIAGNOSIS — E1129 Type 2 diabetes mellitus with other diabetic kidney complication: Secondary | ICD-10-CM | POA: Diagnosis not present

## 2018-03-28 DIAGNOSIS — E1165 Type 2 diabetes mellitus with hyperglycemia: Secondary | ICD-10-CM | POA: Diagnosis not present

## 2018-03-28 DIAGNOSIS — Z7689 Persons encountering health services in other specified circumstances: Secondary | ICD-10-CM | POA: Diagnosis not present

## 2018-04-03 DIAGNOSIS — F331 Major depressive disorder, recurrent, moderate: Secondary | ICD-10-CM | POA: Diagnosis not present

## 2018-04-04 DIAGNOSIS — Z7689 Persons encountering health services in other specified circumstances: Secondary | ICD-10-CM | POA: Diagnosis not present

## 2018-04-13 DIAGNOSIS — E1165 Type 2 diabetes mellitus with hyperglycemia: Secondary | ICD-10-CM | POA: Diagnosis not present

## 2018-04-17 DIAGNOSIS — G8929 Other chronic pain: Secondary | ICD-10-CM | POA: Diagnosis not present

## 2018-04-17 DIAGNOSIS — M722 Plantar fascial fibromatosis: Secondary | ICD-10-CM | POA: Diagnosis not present

## 2018-04-17 DIAGNOSIS — M5442 Lumbago with sciatica, left side: Secondary | ICD-10-CM | POA: Diagnosis not present

## 2018-04-17 DIAGNOSIS — Z7689 Persons encountering health services in other specified circumstances: Secondary | ICD-10-CM | POA: Diagnosis not present

## 2018-04-17 DIAGNOSIS — B373 Candidiasis of vulva and vagina: Secondary | ICD-10-CM | POA: Diagnosis not present

## 2018-04-17 DIAGNOSIS — N898 Other specified noninflammatory disorders of vagina: Secondary | ICD-10-CM | POA: Diagnosis not present

## 2018-04-19 DIAGNOSIS — F331 Major depressive disorder, recurrent, moderate: Secondary | ICD-10-CM | POA: Diagnosis not present

## 2018-05-19 DIAGNOSIS — E1165 Type 2 diabetes mellitus with hyperglycemia: Secondary | ICD-10-CM | POA: Diagnosis not present

## 2018-05-29 DIAGNOSIS — E1165 Type 2 diabetes mellitus with hyperglycemia: Secondary | ICD-10-CM | POA: Diagnosis not present

## 2018-06-29 DIAGNOSIS — E1165 Type 2 diabetes mellitus with hyperglycemia: Secondary | ICD-10-CM | POA: Diagnosis not present

## 2018-07-06 DIAGNOSIS — E785 Hyperlipidemia, unspecified: Secondary | ICD-10-CM | POA: Diagnosis not present

## 2018-07-06 DIAGNOSIS — E039 Hypothyroidism, unspecified: Secondary | ICD-10-CM | POA: Diagnosis not present

## 2018-07-06 DIAGNOSIS — Z794 Long term (current) use of insulin: Secondary | ICD-10-CM | POA: Diagnosis not present

## 2018-07-06 DIAGNOSIS — R809 Proteinuria, unspecified: Secondary | ICD-10-CM | POA: Diagnosis not present

## 2018-07-06 DIAGNOSIS — E1129 Type 2 diabetes mellitus with other diabetic kidney complication: Secondary | ICD-10-CM | POA: Diagnosis not present

## 2018-07-06 DIAGNOSIS — E1165 Type 2 diabetes mellitus with hyperglycemia: Secondary | ICD-10-CM | POA: Diagnosis not present

## 2018-07-06 DIAGNOSIS — E1169 Type 2 diabetes mellitus with other specified complication: Secondary | ICD-10-CM | POA: Diagnosis not present

## 2018-07-21 DIAGNOSIS — Z794 Long term (current) use of insulin: Secondary | ICD-10-CM | POA: Diagnosis not present

## 2018-07-21 DIAGNOSIS — S46811A Strain of other muscles, fascia and tendons at shoulder and upper arm level, right arm, initial encounter: Secondary | ICD-10-CM | POA: Diagnosis not present

## 2018-07-21 DIAGNOSIS — E1129 Type 2 diabetes mellitus with other diabetic kidney complication: Secondary | ICD-10-CM | POA: Diagnosis not present

## 2018-07-21 DIAGNOSIS — M5412 Radiculopathy, cervical region: Secondary | ICD-10-CM | POA: Diagnosis not present

## 2018-07-21 DIAGNOSIS — R809 Proteinuria, unspecified: Secondary | ICD-10-CM | POA: Diagnosis not present

## 2018-07-21 DIAGNOSIS — E1165 Type 2 diabetes mellitus with hyperglycemia: Secondary | ICD-10-CM | POA: Diagnosis not present

## 2018-07-31 DIAGNOSIS — E1165 Type 2 diabetes mellitus with hyperglycemia: Secondary | ICD-10-CM | POA: Diagnosis not present

## 2018-08-03 DIAGNOSIS — M47816 Spondylosis without myelopathy or radiculopathy, lumbar region: Secondary | ICD-10-CM | POA: Diagnosis not present

## 2018-08-03 DIAGNOSIS — M7918 Myalgia, other site: Secondary | ICD-10-CM | POA: Diagnosis not present

## 2018-08-03 DIAGNOSIS — G8929 Other chronic pain: Secondary | ICD-10-CM | POA: Diagnosis not present

## 2018-08-03 DIAGNOSIS — M47812 Spondylosis without myelopathy or radiculopathy, cervical region: Secondary | ICD-10-CM | POA: Diagnosis not present

## 2018-08-11 ENCOUNTER — Ambulatory Visit (HOSPITAL_COMMUNITY)
Admission: EM | Admit: 2018-08-11 | Discharge: 2018-08-11 | Disposition: A | Discharge status: Home / Self Care | Payer: Medicaid Other | Attending: Internal Medicine | Admitting: Internal Medicine

## 2018-08-11 ENCOUNTER — Encounter (HOSPITAL_COMMUNITY): Payer: Self-pay | Admitting: Emergency Medicine

## 2018-08-11 DIAGNOSIS — J302 Other seasonal allergic rhinitis: Secondary | ICD-10-CM

## 2018-08-11 DIAGNOSIS — J069 Acute upper respiratory infection, unspecified: Secondary | ICD-10-CM | POA: Diagnosis not present

## 2018-08-11 DIAGNOSIS — B9789 Other viral agents as the cause of diseases classified elsewhere: Secondary | ICD-10-CM | POA: Diagnosis not present

## 2018-08-11 MED ORDER — SALINE SPRAY 0.65 % NA SOLN: 1.0000 | NASAL | 0 refills | Status: DC | PRN

## 2018-08-11 MED ORDER — FLUTICASONE PROPIONATE 50 MCG/ACT NA SUSP: 2.0000 | Freq: Every day | NASAL | 2 refills | Status: DC

## 2018-08-11 NOTE — ED Triage Notes (Signed)
Pt c/o nasal congestion x2 weeks.

## 2018-08-11 NOTE — ED Provider Notes (Signed)
[facility]    CSN: [id number] Arrival date & time: 08/11/18  1740     History   Chief Complaint Chief Complaint  Patient presents with  . Nasal Congestion    HPI Lindsey Bowen is a 48 y.o. female with a history of diabetes mellitus type 2 on Trulicity, hypothyroidism on Synthroid comes to the urgent care with complaints of nasal congestion of 2 weeks duration.  Patient symptoms started insidiously and is gotten progressively worse.  No relieving factors.  No known aggravating factors.  It is associated with yellowish-brown nasal discharge, cough which is minimally productive and a sore throat.  No fever or chills.  Patient denies wheezing.  No nausea or vomiting.  No diarrhea.   HPI  Past Medical History:  Diagnosis Date  . Anemia   . 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 Diabetes mellitus without complication (HCC)   . Edema   . Lumbar pain   . Onychodystrophy   . Onychomycosis   . Paresthesia 05/17/2017  . Sleep apnea   . Thyroid disease   . TIA (transient ischemic attack) 05/26/2017  . 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 Essential hypertension 05/27/2017  . Hyperlipidemia LDL goal <70 05/27/2017  . Hypothyroidism 05/27/2017  . TIA (transient ischemic attack) 05/26/2017  . Paresthesia 05/17/2017    Past Surgical History:  Procedure Laterality Date  . CHOLECYSTECTOMY    . DILATION AND CURETTAGE OF UTERUS    . HERNIA REPAIR      OB History   No obstetric history on file.      Home Medications    Prior to Admission medications   Medication Sig Start Date End Date Taking? Authorizing Provider  acetaminophen (TYLENOL) 650 MG CR tablet Take 650 mg by mouth every 8 (eight) hours  as needed for pain.    [provider]  aspirin 81 MG chewable tablet Chew 1 tablet (81 mg total) by mouth daily. 05/27/17   Garnette Gunnerhompson, Aaron B, MD  atorvastatin (LIPITOR) 80 MG tablet Take 1 tablet (80 mg total) by mouth daily at 6 PM. 05/27/17   Garnette Gunnerhompson, Aaron B, MD  cyclobenzaprine (FLEXERIL) 5 MG tablet Take 5 mg by mouth 3 (three) times daily as needed for muscle spasms.    [provider]  Dulaglutide (TRULICITY) 1.5 MG/0.5ML SOPN Inject 1.5 mg into the skin once a week.     [provider]  gabapentin (NEURONTIN) 100 MG capsule TAKE 1 CAPSULE BY MOUTH THREE TIMES A DAY Patient not taking: Reported on 08/11/2018 02/07/18   York SpanielWillis, Charles K, MD  insulin aspart (NOVOLOG) 100 UNIT/ML injection Inject 10 Units into the skin 3 (three) times daily with meals.     [provider]  Insulin Glargine (LANTUS [place]) Inject 40 Units into the skin at bedtime.     [provider]  ipratropium (ATROVENT) 0.06 % nasal spray Place 2 sprays into both nostrils 4 (four) times daily. Patient not taking: Reported on 05/26/2017 09/21/16   Deatra Canterxford, William J, FNP  levothyroxine (SYNTHROID, LEVOTHROID) 175 MCG tablet Take 1 tablet (175 mcg total) by mouth daily before breakfast. 05/28/17   Garnette Gunnerhompson, Aaron B, MD  lisinopril (PRINIVIL,ZESTRIL) 5 MG tablet Take 5 mg by mouth daily.    [provider]  meclizine (ANTIVERT) 25 MG tablet Take 1 tablet (25 mg total) by mouth 3 (three) times daily as needed for dizziness. Patient not taking: Reported on 07/15/2017 09/21/16   Deatra Canter, FNP  Multiple Vitamin (MULTIVITAMIN) tablet Take 1 tablet by mouth daily.    [provider]  naproxen sodium (ALEVE) 220 MG tablet Take 220 mg by mouth daily as needed (headache).    [provider]    Family History Family History  Problem Relation Age of Onset  . Diabetes Mother   . Hypertension Mother   . Heart attack Maternal Grandmother   . Stroke Maternal  Grandmother   . Diabetes Maternal Grandfather   . Cancer Maternal Grandfather     Social History Social History   Tobacco Use  . Smoking status: Never Smoker  . Smokeless tobacco: Never Used  Substance Use Topics  . Alcohol use: No  . Drug use: Not on file     Allergies   Dust mite extract and Shellfish allergy   Review of Systems Review of Systems  Constitutional: Positive for fatigue. Negative for activity change, appetite change, chills and fever.  HENT: Positive for sore throat and voice change. Negative for ear discharge, ear pain, sinus pressure and sinus pain.   Eyes: Negative for pain and itching.  Respiratory: Positive for cough. Negative for chest tightness, shortness of breath and wheezing.   Musculoskeletal: Negative for arthralgias and myalgias.  Skin: Negative for rash.  Neurological: Negative for dizziness, weakness and light-headedness.     Physical Exam Triage Vital Signs ED Triage Vitals  Enc Vitals Group     BP 08/11/18 1755 108/61     Pulse Rate 08/11/18 1754 99     Resp 08/11/18 1754 18     Temp 08/11/18 1754 98.4 F (36.9 C)     Temp src --      SpO2 08/11/18 1754 100 %     Weight --      Height --      Head Circumference --      Peak Flow --      Pain Score 08/11/18 1754 4     Pain Loc --      Pain Edu? --      Excl. in GC? --    No data found.  Updated Vital Signs BP 108/61   Pulse 99   Temp 98.4 F (36.9 C)   Resp 18   SpO2 100%   Visual Acuity Right Eye Distance:   Left Eye Distance:   Bilateral Distance:    Right Eye Near:   Left Eye Near:    Bilateral Near:     Physical Exam Constitutional:      Appearance: Normal appearance. She is not ill-appearing.  HENT:     Right Ear: Tympanic membrane normal.     Left Ear: Tympanic membrane normal.     Nose: Congestion present.     Mouth/Throat:     Mouth: Mucous membranes are moist.     Pharynx: No oropharyngeal exudate or posterior oropharyngeal erythema.  Eyes:      Conjunctiva/sclera: Conjunctivae normal.  Neck:     Musculoskeletal: Normal range of motion.  Cardiovascular:     Rate and Rhythm: Normal rate and regular rhythm.  Pulmonary:     Effort: Pulmonary effort is normal. No respiratory distress.     Breath sounds: No wheezing or rales.  Lymphadenopathy:     Cervical: No cervical adenopathy.  Skin:  General: Skin is warm.     Capillary Refill: Capillary refill takes less than 2 seconds.     Findings: No erythema or rash.  Neurological:     General: No focal deficit present.     Mental Status: She is alert.      UC Treatments / Results  Labs (all labs ordered are listed, but only abnormal results are displayed) Labs Reviewed - No data to display  EKG None  Radiology No results found.  Procedures Procedures (including critical care time)  Medications Ordered in UC Medications - No data to display  Initial Impression / Assessment and Plan / UC Course  I have reviewed the triage vital signs and the nursing notes.  Pertinent labs & imaging results that were available during my care of the patient were reviewed by me and considered in my medical decision making (see chart for details).     1.  Acute nasopharyngitis: Flonase Saline nasal spray Humidifier at home  2.  Diabetes mellitus type 2: Continue current home medication regimen Final Clinical Impressions(s) / UC Diagnoses   Final diagnoses:  None   Discharge Instructions   None    ED Prescriptions    None     Controlled Substance Prescriptions  Controlled Substance Registry consulted? No   Merrilee Jansky, MD 08/11/18 [phone]

## 2018-08-28 DIAGNOSIS — E1165 Type 2 diabetes mellitus with hyperglycemia: Secondary | ICD-10-CM | POA: Diagnosis not present

## 2018-08-31 DIAGNOSIS — R809 Proteinuria, unspecified: Secondary | ICD-10-CM | POA: Diagnosis not present

## 2018-08-31 DIAGNOSIS — E039 Hypothyroidism, unspecified: Secondary | ICD-10-CM | POA: Diagnosis not present

## 2018-08-31 DIAGNOSIS — E1129 Type 2 diabetes mellitus with other diabetic kidney complication: Secondary | ICD-10-CM | POA: Diagnosis not present

## 2018-08-31 DIAGNOSIS — E785 Hyperlipidemia, unspecified: Secondary | ICD-10-CM | POA: Diagnosis not present

## 2018-08-31 DIAGNOSIS — Z794 Long term (current) use of insulin: Secondary | ICD-10-CM | POA: Diagnosis not present

## 2018-08-31 DIAGNOSIS — E1169 Type 2 diabetes mellitus with other specified complication: Secondary | ICD-10-CM | POA: Diagnosis not present

## 2018-08-31 DIAGNOSIS — E1165 Type 2 diabetes mellitus with hyperglycemia: Secondary | ICD-10-CM | POA: Diagnosis not present

## 2018-09-05 ENCOUNTER — Ambulatory Visit: Payer: Medicaid Other | Attending: Nurse Practitioner | Admitting: Physical Therapy

## 2018-09-05 ENCOUNTER — Other Ambulatory Visit: Payer: Self-pay

## 2018-09-05 DIAGNOSIS — G8929 Other chronic pain: Secondary | ICD-10-CM | POA: Diagnosis not present

## 2018-09-05 DIAGNOSIS — J069 Acute upper respiratory infection, unspecified: Secondary | ICD-10-CM | POA: Diagnosis not present

## 2018-09-05 DIAGNOSIS — R062 Wheezing: Secondary | ICD-10-CM | POA: Diagnosis not present

## 2018-09-05 DIAGNOSIS — M542 Cervicalgia: Secondary | ICD-10-CM | POA: Diagnosis not present

## 2018-09-05 DIAGNOSIS — M5412 Radiculopathy, cervical region: Secondary | ICD-10-CM | POA: Diagnosis not present

## 2018-09-05 DIAGNOSIS — R252 Cramp and spasm: Secondary | ICD-10-CM | POA: Diagnosis not present

## 2018-09-05 DIAGNOSIS — M5442 Lumbago with sciatica, left side: Secondary | ICD-10-CM | POA: Diagnosis not present

## 2018-09-05 NOTE — Patient Instructions (Signed)
Supine Post pelvic tilt knee to chest  30 sec x 3  Lower trunk rotation with head turns  All 2 x per day x 10 reps , 10 sec hold

## 2018-09-06 ENCOUNTER — Encounter: Payer: Self-pay | Admitting: Physical Therapy

## 2018-09-06 NOTE — Therapy (Signed)
[facility] [address] [place], [place], [address] Phone: [phone]   Fax:  [phone]  Physical Therapy Evaluation  Patient Details  Name: Guneet Detorres MRN: [id number] Date of Birth: Dec 22, 1970 Referring Provider (PT):  Roseanna Rainbow, NP    Encounter Date: 09/05/2018  PT End of Session - 09/05/18 0930    Visit Number  1    Number of Visits  4    Date for PT Re-Evaluation  10/03/18    Authorization Type  MCD     Authorization - Visit Number  0    Authorization - Number of Visits  4    PT Start Time  0853    PT Stop Time  0933    PT Time Calculation (min)  40 min    Activity Tolerance  Patient tolerated treatment well    Behavior During Therapy  [facility] for tasks assessed/performed       Past Medical History:  Diagnosis Date  . Anemia   . 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 Diabetes mellitus without complication (HCC)   . Edema   . Lumbar pain   . Onychodystrophy   . Onychomycosis   . Paresthesia 05/17/2017  . Sleep apnea   . Thyroid disease   . TIA (transient ischemic attack) 05/26/2017  . 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 CHOLECYSTECTOMY    . DILATION AND CURETTAGE OF UTERUS    . HERNIA REPAIR      There were no vitals filed for this visit.   Subjective Assessment - 09/05/18 0856    Subjective  Patient has been dealing with chronic pain in neck and back for 8-10 yrs.  She has had an increase in pain with increased work demands late summer  2019.  Pain mostly in low back to L and Rt side of neck.  Radiates into Rt UE to fingers, hand becomes stiff.  L radicular pain into post Lt thigh with sensory disturbance.  L hip burns.  Legs feel weak at times.      Pertinent History  diabetes, high cholesterol     Limitations  Walking;Standing;Lifting;House hold activities;Sitting;Other (comment)    stress   How long can you sit comfortably?  sometimes up to 1-2 hours .  Would rather sit than stand     How long can you stand comfortably?  4 hours (up to 7/10-9/10) takes hours to recover     How long can you walk comfortably?  not sure     Diagnostic tests  XR cervical spine unremarkable     Patient Stated Goals  pain relief     Currently in Pain?  Yes    Pain Score  2     Pain Location  Back    Pain Orientation  Left    Pain Descriptors / Indicators  Cramping;Stabbing;Aching    Pain Type  Chronic pain    Pain Radiating Towards  LLE to knee     Pain Onset  More than a month ago    Pain Frequency  Constant    Aggravating Factors   sitting , standing     Pain Relieving Factors  leaning forward, meds, Prednisone has helped neck and back     Effect of Pain on Daily Activities  limits comfort     Multiple Pain Sites  Yes    Pain Score  0  Pain Location  Neck    Pain Orientation  Right    Pain Descriptors / Indicators  Tightness    Pain Type  Chronic pain    Pain Radiating Towards  Rt UE     Pain Onset  More than a month ago    Pain Frequency  Intermittent    Aggravating Factors   gets a spasm , stress , turning to Rt, lifting     Pain Relieving Factors  meds, ice min          [facility] PT Assessment - 09/06/18 0001      Assessment   Medical Diagnosis  myofascial pain, lumbar and cervical spondylosis     Referring Provider (PT)   Roseanna RainbowKao Ly, NP     Onset Date/Surgical Date  --   chronic , 8 yrs    Prior Therapy  No       Precautions   Precautions  None      Balance Screen   Has the patient fallen in the past 6 months  No      Home Environment   Living Environment  Private residence      Prior Function   Level of Independence  Independent    Vocation  Part time employment    Vocation Requirements  [employer] standing, lifting occasional       Cognition   Overall Cognitive Status  Within Functional Limits for tasks assessed      Observation/Other  Assessments   Focus on Therapeutic Outcomes (FOTO)   NT due to MCD       Sensation   Additional Comments  has intermittent numbness in Rt arm at times       Squat   Comments  knee pain , shallow       Single Leg Stance   Comments  WFL       Posture/Postural Control   Posture/Postural Control  Postural limitations    Postural Limitations  Rounded Shoulders;Forward head;Increased lumbar lordosis;Anterior pelvic tilt    Posture Comments  elevated shoulders       AROM   Overall AROM Comments  pain with quadrant screen bilaterally.  All other planes stiff but able to do     Cervical Flexion  25    Cervical Extension  40 pain     Cervical - Right Side Bend  50    Cervical - Left Side Bend  35    Cervical - Right Rotation  50    Cervical - Left Rotation  60    Lumbar Flexion  WFL     Lumbar Extension  WFL pain central     Lumbar - Right Side Bend  pain L     Lumbar - Left Side Bend  tight L     Lumbar - Right Rotation  WFL pain on L     Lumbar - Left Rotation  WFL       Strength   Overall Strength Comments  WFL in bilateral LEs       Palpation   Spinal mobility  min to central lumbar segments, hypomobile , no LE pain     Palpation comment  none tender in paraspinals , some present in posterior cervicals and into Rt Upper trap, hypertonic       Special Tests    Special Tests  Lumbar    Lumbar Tests  Straight Leg Raise      Straight Leg Raise   Findings  Negative                Objective measurements completed on examination: See above findings.                PT Short Term Goals - 09/05/18 1440      PT SHORT TERM GOAL #1   Title  Pt will be I with HEP for trunk flexibility and posture    Baseline  unknown    Time  4    Period  Weeks    Status  New    Target Date  10/03/18      PT SHORT TERM GOAL #2   Title  Pt will report pain improved about 20-25% with turning head, basic mobiity tasks in the home.      Baseline  pain usually moderate , can  be severe with prolonged standing     Time  4    Period  Weeks    Status  New    Target Date  10/03/18      PT SHORT TERM GOAL #3   Title  Pt will understand role of posture and mobiity in reducing pain, spasm and improved daily function.     Baseline  unknown    Time  4    Period  Weeks    Status  New    Target Date  10/03/18        PT Long Term Goals - 09/06/18 0601      PT LONG TERM GOAL #1   Title  TBA as patient improves, attends PT.              Plan - 09/05/18 1410    Clinical Impression Statement  Patient presents for mod complexity eval of neck and low back pain with radicualr symptoms into respective limb. She has sensory symptoms intermittently.  She has decent LE and UE strength but lacks core control and stability.  She has increased muscle tightness in upper traps, neck and lumbar spine.  She has noted an increase in muscle spasm in back and hips as wel as radiating symptoms into Rt UE and L LE.  She will benefit from PT to improve her function at home and at work.      Personal Factors and Comorbidities  Comorbidity 1;Comorbidity 2;Education;Time since onset of injury/illness/exacerbation;Past/Current Experience;Fitness   lack of fitness   Examination-Activity Limitations  Sit;Sleep;Bend;Lift;Stairs;Locomotion Level;Carry;Stand;Reach Overhead    Examination-Participation Restrictions  Yard Work;Laundry;Cleaning;Community Activity;Shop    Stability/Clinical Decision Making  Stable/Uncomplicated    Clinical Decision Making  Low    Rehab Potential  Good    PT Frequency  1x / week    PT Duration  3 weeks   then 2 x 6 weeks if improving    PT Treatment/Interventions  ADLs/Self Care Home Management;Electrical Stimulation;Moist Heat;Therapeutic exercise;Therapeutic activities;Functional mobility training;Stair training;Balance training;Patient/family education;Dry needling;Manual techniques;Spinal Manipulations    PT Next Visit Plan  check HEP, begin core,  mobilizations to cervical and lumbar spine, add in C spine ROM     PT Home Exercise Plan  LTR with head turns, knee to chest and supine post pelvic tilt    Consulted and Agree with Plan of Care  Patient       Patient will benefit from skilled therapeutic intervention in order to improve the following deficits and impairments:  Decreased endurance, Decreased mobility, Difficulty walking, Hypomobility, Increased muscle spasms, Obesity, Improper body mechanics, Decreased range of motion, Decreased activity tolerance, Decreased strength, Increased  fascial restricitons, Impaired flexibility, Postural dysfunction, Pain  Visit Diagnosis: Chronic left-sided low back pain with left-sided sciatica  Cervicalgia  Radiculopathy, cervical region  Cramp and spasm     Problem List Patient Active Problem List   Diagnosis Date Noted  . 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 Essential hypertension 05/27/2017  . Hyperlipidemia LDL goal <70 05/27/2017  . Hypothyroidism 05/27/2017  . TIA (transient ischemic attack) 05/26/2017  . Paresthesia 05/17/2017    Casimira Sutphin 09/06/2018, 7:52 AM  [facility] [address] [place], [place], [address] Phone: [phone]   Fax:  [phone]  Name: Phoenix Riesen MRN: [id number] Date of Birth: 04/15/1971   Karie Mainland, PT 09/06/18 7:52 AM Phone: [phone] Fax: [phone]

## 2018-09-18 ENCOUNTER — Ambulatory Visit: Payer: Medicaid Other | Admitting: Physical Therapy

## 2018-09-25 ENCOUNTER — Ambulatory Visit: Payer: Medicaid Other | Admitting: Physical Therapy

## 2018-09-27 ENCOUNTER — Telehealth: Payer: Self-pay | Admitting: Physical Therapy

## 2018-09-27 DIAGNOSIS — E1165 Type 2 diabetes mellitus with hyperglycemia: Secondary | ICD-10-CM | POA: Diagnosis not present

## 2018-09-27 NOTE — Telephone Encounter (Signed)
She was contacted today regarding the temporary reduction of OP Rehab Services due to concerns for community transmission of Covid-19 and left message about cancelling her appointments.    Ivery Quale, PT, DPT 09/27/18 4:17 PM .

## 2018-10-02 ENCOUNTER — Ambulatory Visit: Payer: Medicaid Other | Admitting: Physical Therapy

## 2018-10-11 DIAGNOSIS — Z794 Long term (current) use of insulin: Secondary | ICD-10-CM | POA: Diagnosis not present

## 2018-10-11 DIAGNOSIS — E785 Hyperlipidemia, unspecified: Secondary | ICD-10-CM | POA: Diagnosis not present

## 2018-10-11 DIAGNOSIS — E1165 Type 2 diabetes mellitus with hyperglycemia: Secondary | ICD-10-CM | POA: Diagnosis not present

## 2018-10-11 DIAGNOSIS — E1129 Type 2 diabetes mellitus with other diabetic kidney complication: Secondary | ICD-10-CM | POA: Diagnosis not present

## 2018-10-11 DIAGNOSIS — R809 Proteinuria, unspecified: Secondary | ICD-10-CM | POA: Diagnosis not present

## 2018-10-11 DIAGNOSIS — E1169 Type 2 diabetes mellitus with other specified complication: Secondary | ICD-10-CM | POA: Diagnosis not present

## 2018-10-11 DIAGNOSIS — E039 Hypothyroidism, unspecified: Secondary | ICD-10-CM | POA: Diagnosis not present

## 2018-10-17 ENCOUNTER — Ambulatory Visit: Payer: Medicaid Other | Attending: Nurse Practitioner | Admitting: Nurse Practitioner

## 2018-10-17 ENCOUNTER — Encounter: Payer: Self-pay | Admitting: Nurse Practitioner

## 2018-10-17 ENCOUNTER — Other Ambulatory Visit: Payer: Self-pay

## 2018-10-17 DIAGNOSIS — R600 Localized edema: Secondary | ICD-10-CM | POA: Insufficient documentation

## 2018-10-17 DIAGNOSIS — Z8673 Personal history of transient ischemic attack (TIA), and cerebral infarction without residual deficits: Secondary | ICD-10-CM | POA: Insufficient documentation

## 2018-10-17 DIAGNOSIS — Z7901 Long term (current) use of anticoagulants: Secondary | ICD-10-CM | POA: Diagnosis not present

## 2018-10-17 DIAGNOSIS — E118 Type 2 diabetes mellitus with unspecified complications: Secondary | ICD-10-CM | POA: Diagnosis not present

## 2018-10-17 DIAGNOSIS — L600 Ingrowing nail: Secondary | ICD-10-CM | POA: Insufficient documentation

## 2018-10-17 DIAGNOSIS — I10 Essential (primary) hypertension: Secondary | ICD-10-CM | POA: Insufficient documentation

## 2018-10-17 DIAGNOSIS — Z794 Long term (current) use of insulin: Secondary | ICD-10-CM | POA: Diagnosis not present

## 2018-10-17 DIAGNOSIS — Z79899 Other long term (current) drug therapy: Secondary | ICD-10-CM | POA: Diagnosis not present

## 2018-10-17 DIAGNOSIS — E119 Type 2 diabetes mellitus without complications: Secondary | ICD-10-CM | POA: Diagnosis not present

## 2018-10-17 DIAGNOSIS — E782 Mixed hyperlipidemia: Secondary | ICD-10-CM | POA: Diagnosis not present

## 2018-10-17 DIAGNOSIS — D649 Anemia, unspecified: Secondary | ICD-10-CM | POA: Diagnosis not present

## 2018-10-17 NOTE — Progress Notes (Signed)
Virtual Visit via Telephone Note  I connected with Lindsey Bowen on 10/17/18  at   3:30 PM EDT  EDT by telephone and verified that I am speaking with the correct person using two identifiers.   Consent I discussed the limitations, risks, security and privacy concerns of performing an evaluation and management service by telephone and the availability of in person appointments. I also discussed with the patient that there may be a patient responsible charge related to this service. The patient expressed understanding and agreed to proceed.   Location of Patient: Private Residence   Location of Provider: [facility]    Persons participating in Telemedicine visit: [name] FNP-BC YY [place] CMA Elka Stopa    History of Present Illness: Telemedicine visit to establish care.  She has a history of anemia, TIA (2018), hypertension, diabetes mellitus type 2 with complications (currently being managed by endocrinologist Dr. Jolyne Loa), hyperlipidemia and depression.  She has not seen her therapist in several months however states she will not be picking back up on her appointments again soon as her work schedule has recently changed.  She denies any current thoughts of self-harm.  She takes HCTZ for BLE edema and denies HTN however HTN is on her PMH profile. Overdue for PAP smear.  Last PAP 2017. Required reinsertion of IUD after a year due to displacement.   She has complaints today of possible ingrown toenail of the right great toe. Has not seen a podiatrist in over 5 years. SHe has a history of BLE wearing compression .  Anemia.    DM TYPE 2 Lab Results  Component Value Date   HGBA1C 9.9 (H) 05/27/2017  Not well controlled.  Managed by endocrinology.  Current medications include Lantus 40 units at bedtime and Trulicity 1.5 mg weekly.   Hyperlipidemia LDL at goal of less than 70.  Endorses medication compliance taking rosuvastatin 20 mg daily.  Denies  any statin intolerance or myalgias. Lab Results  Component Value Date   LDLCALC 61 05/27/2017   Essential Hypertension She denies HTN. Taking HCTZ 25mg  and Lisinopril 5mg  (renal protection) as prescribed. Denies chest pain, shortness of breath, palpitations, lightheadedness, dizziness, headaches. She does not monitor her blood pressure at home.  BP Readings from Last 3 Encounters:  08/11/18 108/61  05/27/17 128/82  05/17/17 121/78    Toe Problem Has not seen podiatrist in several years. States the nail bed of the right great toe is swollen, painful to touch and mildy erythematous. She denies cutting her toes recently and thinks the nail may have grown into the side of the nail bed. Denies any expressible purulent drainage.   Observations/Objective: Awake, alert and oriented x3    Assessment and Plan: Lindsey Bowen was seen today for new patient (initial visit).  Diagnoses and all orders for this visit:  Diabetes mellitus type 2 with complications (HCC) -     Ambulatory referral to Podiatry -     Ambulatory referral to Ophthalmology  Mixed hyperlipidemia INSTRUCTIONS: Work on a low fat, heart healthy diet and participate in regular aerobic exercise program by working out at least 150 minutes per week; 5 days a week-30 minutes per day. Avoid red meat, fried foods. junk foods, sodas, sugary drinks, unhealthy snacking, alcohol and smoking.  Drink at least 48oz of water per day and monitor your carbohydrate intake daily.    Essential hypertension Continue all antihypertensives as prescribed.  Remember to bring in your blood pressure log with you for your  follow up appointment.  DASH/Mediterranean Diets are healthier choices for HTN.    Ingrown right big toenail -     Ambulatory referral to Podiatry -     mupirocin ointment (BACTROBAN) 2 %; Apply to affected area twice daily Soak the affected foot in warm, soapy water for 10 to 20 minutes three times per day for one to two weeks,  pushing the lateral nail fold away from the nail plate. Alternatively, a solution of water mixed with 1 to 2 teaspoons of Epsom salts can be used/   Follow Up Instructions Return in about 3 months (around 01/16/2019).     I discussed the assessment and treatment plan with the patient. The patient was provided an opportunity to ask questions and all were answered. The patient agreed with the plan and demonstrated an understanding of the instructions.   The patient was advised to call back or seek an in-person evaluation if the symptoms worsen or if the condition fails to improve as anticipated.  I provided 20 minutes of non-face-to-face time during this encounter including median intraservice time, reviewing previous notes, labs, imaging, medications and explaining diagnosis and management.  Claiborne RiggZelda W Cutter Passey, FNP-BC

## 2018-10-18 ENCOUNTER — Encounter: Payer: Self-pay | Admitting: Nurse Practitioner

## 2018-10-18 MED ORDER — MUPIROCIN 2 % EX OINT: TOPICAL_OINTMENT | CUTANEOUS | 0 refills | Status: DC

## 2018-10-24 ENCOUNTER — Ambulatory Visit: Payer: Medicaid Other

## 2018-10-26 DIAGNOSIS — E1165 Type 2 diabetes mellitus with hyperglycemia: Secondary | ICD-10-CM | POA: Diagnosis not present

## 2018-10-27 DIAGNOSIS — E1169 Type 2 diabetes mellitus with other specified complication: Secondary | ICD-10-CM | POA: Diagnosis not present

## 2018-10-27 DIAGNOSIS — E039 Hypothyroidism, unspecified: Secondary | ICD-10-CM | POA: Diagnosis not present

## 2018-10-27 DIAGNOSIS — R809 Proteinuria, unspecified: Secondary | ICD-10-CM | POA: Diagnosis not present

## 2018-10-27 DIAGNOSIS — Z794 Long term (current) use of insulin: Secondary | ICD-10-CM | POA: Diagnosis not present

## 2018-10-27 DIAGNOSIS — E1129 Type 2 diabetes mellitus with other diabetic kidney complication: Secondary | ICD-10-CM | POA: Diagnosis not present

## 2018-10-27 DIAGNOSIS — E1165 Type 2 diabetes mellitus with hyperglycemia: Secondary | ICD-10-CM | POA: Diagnosis not present

## 2018-10-27 DIAGNOSIS — E785 Hyperlipidemia, unspecified: Secondary | ICD-10-CM | POA: Diagnosis not present

## 2018-10-31 ENCOUNTER — Ambulatory Visit: Payer: Medicaid Other | Attending: Nurse Practitioner | Admitting: Physical Therapy

## 2018-10-31 ENCOUNTER — Other Ambulatory Visit: Payer: Self-pay

## 2018-10-31 ENCOUNTER — Encounter: Payer: Self-pay | Admitting: Physical Therapy

## 2018-10-31 DIAGNOSIS — M5442 Lumbago with sciatica, left side: Secondary | ICD-10-CM | POA: Insufficient documentation

## 2018-10-31 DIAGNOSIS — R252 Cramp and spasm: Secondary | ICD-10-CM | POA: Diagnosis not present

## 2018-10-31 DIAGNOSIS — G8929 Other chronic pain: Secondary | ICD-10-CM | POA: Diagnosis not present

## 2018-10-31 DIAGNOSIS — M5412 Radiculopathy, cervical region: Secondary | ICD-10-CM | POA: Insufficient documentation

## 2018-10-31 DIAGNOSIS — M542 Cervicalgia: Secondary | ICD-10-CM | POA: Diagnosis not present

## 2018-10-31 NOTE — Therapy (Signed)
[facility] [address] [place], [place], [address] Phone: [phone]   Fax:  [phone]  Physical Therapy Treatment/Re-eval  Patient Details  Name: Lindsey Bowen MRN: [id number] Date of Birth: Nov 05, 1970 Referring Provider (PT): Roseanna Rainbow    Encounter Date: 10/31/2018  PT End of Session - 10/31/18 1241    Visit Number  2    Number of Visits  13    Date for PT Re-Evaluation  12/12/18    Authorization Type  MCD New auth submited     PT Start Time  1230    PT Stop Time  1317    PT Time Calculation (min)  47 min    Activity Tolerance  Patient tolerated treatment well    Behavior During Therapy  [facility] for tasks assessed/performed       Past Medical History:  Diagnosis Date  . Anemia   . 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 Diabetes mellitus without complication (HCC)   . Edema   . Lumbar pain   . Onychodystrophy   . Onychomycosis   . Paresthesia 05/17/2017  . Sleep apnea   . Thyroid disease   . TIA (transient ischemic attack) 05/26/2017  . 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 CHOLECYSTECTOMY    . DILATION AND CURETTAGE OF UTERUS    . HERNIA REPAIR      There were no vitals filed for this visit.  Subjective Assessment - 10/31/18 1237    Pertinent History  diabetes, high cholesterol     Limitations  Walking;Standing;Lifting;House hold activities;Sitting;Other (comment)    How long can you sit comfortably?  sometimes up to 1-2 hours .  Would rather sit than stand     How long can you stand comfortably?  4 hours (up to 7/10-9/10) takes hours to recover     How long can you walk comfortably?  not sure     Pain Score  8     Pain Orientation  Left    Pain Descriptors / Indicators  Aching    Pain Type  Chronic pain    Pain Onset  More than a month ago    Pain Frequency  Constant    Aggravating  Factors   Sitting, standing     Pain Relieving Factors  muscle relzers, warm shower     Effect of Pain on Daily Activities  limits comfort     Pain Score  5    Pain Location  Back    Pain Orientation  Right    Pain Descriptors / Indicators  Aching    Pain Type  Chronic pain    Pain Onset  More than a month ago    Pain Frequency  Intermittent    Aggravating Factors   spasms, stress     Pain Relieving Factors  meds, ice          [facility] PT Assessment - 10/31/18 0001      Assessment   Medical Diagnosis  myofascial pain, lumbar and cervical spondylosis     Referring Provider (PT)  Roseanna Rainbow     Prior Therapy  No       Precautions   Precautions  None      Balance Screen   Has the patient fallen in the past 6 months  No      Home Environment   Living Environment  Private residence  AROM   Cervical Flexion  30 with pain     Cervical Extension  35 with pain     Cervical - Right Rotation  56    Cervical - Left Rotation  58      Strength   Strength Assessment Site  Hand    Right/Left hand  Right;Left    Right Hand Grip (lbs)  25    Left Hand Grip (lbs)  40                   [facility] Adult PT Treatment/Exercise - 10/31/18 0001      Exercises   Exercises  Neck      Neck Exercises: Seated   Other Seated Exercise  scap retraction x15       Manual Therapy   Manual Therapy  Joint mobilization;Myofascial release;Manual Traction;Soft tissue mobilization    Soft tissue mobilization  trigger point releease to upper trap     Myofascial Release  to upper trap and cervical spine     Manual Traction  to cervical spine; sub-occipital release;       Neck Exercises: Stretches   Upper Trapezius Stretch Limitations  3x20 sec hold     Levator Stretch Limitations  3x20 sec hold     Other Neck Stretches  reviewed use of the thera-cane              PT Education - 10/31/18 1240    Person(s) Educated  Patient       PT Short Term Goals - 10/31/18 1344      PT SHORT  TERM GOAL #1   Title  Pt will be I with HEP for trunk flexibility and posture    Baseline  unknown    Time  3    Period  Weeks    Status  New    Target Date  11/21/18      PT SHORT TERM GOAL #2   Title  Pt will report pain improved about 20-25% with turning head, basic mobiity tasks in the home.      Baseline  pain usually moderate , can be severe with prolonged standing     Time  3    Period  Weeks    Status  New      PT SHORT TERM GOAL #3   Title  Pt will understand role of posture and mobiity in reducing pain, spasm and improved daily function.     Baseline  can not independently restate    Time  3    Period  Weeks    Status  New        PT Long Term Goals - 10/31/18 1345      PT LONG TERM GOAL #1   Title  Patient will demonstrate 70 degrees of cervical rotation bilateral without pain     Time  6    Period  Weeks    Status  New    Target Date  12/12/18      PT LONG TERM GOAL #2   Title  Patient will stand at work with <3/10 pain for 1 hour in order to perfrom work tasks     Time  6    Period  Weeks    Status  New    Target Date  12/12/18            Plan - 10/31/18 1335    Clinical Impression Statement  Patient continuesto have significant spasming in her upper  trap into her paraspinal area. She has limited cervical rotation bilateral and pain with extension and flexion. She also has pulling with forward lumbar flexion. She has 58 degrees of cervical rotation to the left and 60 to the right. She has been working on the exercises given to her for her back biut she has not been able to do anything for her neck because of our clinic closure 2nd to covid-19. She was also shown how to use a thera-cane. The patient reports she will get one.     Personal Factors and Comorbidities  Comorbidity 1;Comorbidity 2;Education;Time since onset of injury/illness/exacerbation;Past/Current Experience;Fitness    Examination-Activity Limitations  Sit;Sleep;Bend;Lift;Stairs;Locomotion  Level;Carry;Stand;Reach Overhead    Examination-Participation Restrictions  Yard Work;Laundry;Cleaning;Community Activity;Shop    Stability/Clinical Decision Making  Stable/Uncomplicated    Clinical Decision Making  Low    Rehab Potential  Good    PT Frequency  2x / week    PT Duration  6 weeks    PT Treatment/Interventions  ADLs/Self Care Home Management;Electrical Stimulation;Moist Heat;Therapeutic exercise;Therapeutic activities;Functional mobility training;Stair training;Balance training;Patient/family education;Dry needling;Manual techniques;Spinal Manipulations    PT Next Visit Plan  continue manual therapy to cervical and lumbar spine. Continue with postural correction and core strengthening     PT Home Exercise Plan  LTR with head turns, knee to chest and supine post pelvic tilt    Consulted and Agree with Plan of Care  Patient       Patient will benefit from skilled therapeutic intervention in order to improve the following deficits and impairments:  Decreased endurance, Decreased mobility, Difficulty walking, Hypomobility, Increased muscle spasms, Obesity, Improper body mechanics, Decreased range of motion, Decreased activity tolerance, Decreased strength, Increased fascial restricitons, Impaired flexibility, Postural dysfunction, Pain  Visit Diagnosis: Chronic left-sided low back pain with left-sided sciatica  Cervicalgia  Radiculopathy, cervical region  Cramp and spasm     Problem List Patient Active Problem List   Diagnosis Date Noted  . 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 Essential hypertension 05/27/2017  . Hyperlipidemia LDL goal <70 05/27/2017  . Hypothyroidism 05/27/2017  . TIA (transient ischemic attack) 05/26/2017  . Paresthesia 05/17/2017    Dessie Coma PT DPT  10/31/2018, 1:52 PM  [facility] [address] [place], [place], [address] Phone: [phone]   Fax:  [phone]  Name: Niema Carrara MRN: [id number] Date of Birth: [birth year]

## 2018-11-07 ENCOUNTER — Encounter: Payer: Self-pay | Admitting: Podiatry

## 2018-11-07 ENCOUNTER — Ambulatory Visit: Payer: Medicaid Other | Admitting: Physical Therapy

## 2018-11-07 ENCOUNTER — Ambulatory Visit: Payer: Medicaid Other | Admitting: Podiatry

## 2018-11-07 ENCOUNTER — Other Ambulatory Visit: Payer: Self-pay

## 2018-11-07 ENCOUNTER — Ambulatory Visit (INDEPENDENT_AMBULATORY_CARE_PROVIDER_SITE_OTHER): Payer: Medicaid Other

## 2018-11-07 VITALS — BP 111/69 | HR 75 | Temp 97.3°F | Resp 16

## 2018-11-07 DIAGNOSIS — M722 Plantar fascial fibromatosis: Secondary | ICD-10-CM

## 2018-11-07 DIAGNOSIS — B351 Tinea unguium: Secondary | ICD-10-CM

## 2018-11-07 DIAGNOSIS — L600 Ingrowing nail: Secondary | ICD-10-CM | POA: Diagnosis not present

## 2018-11-07 MED ORDER — DICLOFENAC SODIUM 1 % TD GEL: 2.0000 g | Freq: Four times a day (QID) | TRANSDERMAL | 2 refills | Status: DC

## 2018-11-07 NOTE — Patient Instructions (Signed)

## 2018-11-07 NOTE — Progress Notes (Signed)
Subjective:    Patient ID: Lindsey Bowen, female    DOB: 13-Mar-1971, 48 y.o.   MRN: [id number]  HPI 48 year old female presents the office today for primary concerns of pain to the bottom of her heel with the left side worse than the right.  She states that she has had no recent treatment for this.  She has pain in the morning when she first gets up or if she is been sitting for some time and stands back up.  She is on her feet all day which also aggravates her symptoms.  Pain is intermittent.  She states that she works on concrete floors and is also aggravates her symptoms.  She also has secondary concerns of ingrown toenail of the right big toe, medial aspect which is been ongoing for the last 2 to 3 weeks.  She says it was hurting, swollen had pus about 3 weeks ago after she was putting antibiotic ointment on it and this did resolve but she is currently not experiencing any symptoms.  She is diabetic and her last A1c she reports is 10.3.  Her last blood glucose that she checked was 351.   Review of Systems  All other systems reviewed and are negative.  Past Medical History:  Diagnosis Date  . Anemia   . 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 Diabetes mellitus without complication (HCC)   . Edema   . Lumbar pain   . Onychodystrophy   . Onychomycosis   . Paresthesia 05/17/2017  . Sleep apnea   . Thyroid disease   . TIA (transient ischemic attack) 05/26/2017  . 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 CHOLECYSTECTOMY    . DILATION AND CURETTAGE OF UTERUS    . HERNIA REPAIR       Current Outpatient Medications:  .  acetaminophen (TYLENOL) 650 MG CR tablet, Take 650 mg by mouth every 8 (eight) hours as needed for pain., Disp: , Rfl:  .  albuterol (VENTOLIN HFA) 108 (90 Base) MCG/ACT inhaler, Inhale into the lungs., Disp: , Rfl:  .  aspirin 81 MG chewable  tablet, Chew 1 tablet (81 mg total) by mouth daily., Disp: 30 tablet, Rfl: 0 .  atorvastatin (LIPITOR) 80 MG tablet, Take 1 tablet (80 mg total) by mouth daily at 6 PM., Disp: 30 tablet, Rfl: 0 .  cyclobenzaprine (FLEXERIL) 5 MG tablet, Take 5 mg by mouth 3 (three) times daily as needed for muscle spasms., Disp: , Rfl:  .  Dulaglutide (TRULICITY) 1.5 MG/0.5ML SOPN, Inject 1.5 mg into the skin once a week. , Disp: , Rfl:  .  fluticasone (FLONASE) 50 MCG/ACT nasal spray, Place 2 sprays into both nostrils daily., Disp: 16 g, Rfl: 2 .  hydrochlorothiazide (HYDRODIURIL) 25 MG tablet, Take 25 mg by mouth daily., Disp: , Rfl:  .  insulin aspart (NOVOLOG) 100 UNIT/ML injection, Inject 15 Units into the skin 3 (three) times daily with meals. , Disp: , Rfl:  .  Insulin Glargine (LANTUS [place]), Inject 40 Units into the skin at bedtime. , Disp: , Rfl:  .  levothyroxine (SYNTHROID, LEVOTHROID) 175 MCG tablet, Take 1 tablet (175 mcg total) by mouth daily before breakfast., Disp: 30 tablet, Rfl: 0 .  lisinopril (PRINIVIL,ZESTRIL) 5 MG tablet, Take 5 mg by mouth daily., Disp: , Rfl:  .  Multiple Vitamin (MULTIVITAMIN) tablet, Take 1 tablet by mouth daily., Disp: , Rfl:  .  mupirocin ointment (BACTROBAN) 2 %, Apply to affected area twice daily, Disp: 30 g, Rfl: 0 .  naproxen sodium (ALEVE) 220 MG tablet, Take 220 mg by mouth daily as needed (headache)., Disp: , Rfl:  .  rosuvastatin (CRESTOR) 20 MG tablet, Take 20 mg by mouth daily., Disp: , Rfl:  .  sodium chloride (OCEAN) 0.65 % SOLN nasal spray, Place 1 spray into both nostrils as needed for congestion., Disp: , Rfl: 0 .  diclofenac sodium (VOLTAREN) 1 % GEL, Apply 2 g topically 4 (four) times daily. Rub into affected area of foot 2 to 4 times daily, Disp: 100 g, Rfl: 2  Allergies  Allergen Reactions  . Dust Mite Extract Hives and Itching  . Shellfish Allergy Anaphylaxis         Objective:   Physical Exam  General: AAO x3, NAD  Dermatological: The  nails are dystrophic, discolored with yellow-brown discoloration are hypertrophic. The right hallux there is mild incurvation of the medial aspect there is no edema, erythema, drainage or pus there is no tenderness to palpation.  No open lesions.  Vascular: Dorsalis Pedis artery and Posterior Tibial artery pedal pulses are 2/4 bilateral with immedate capillary fill time. Pedal hair growth present. No varicosities and no lower extremity edema present bilateral. There is no pain with calf compression, swelling, warmth, erythema.   Neruologic: Grossly intact via light touch bilateral. Vibratory intact via tuning fork bilateral. Protective threshold with Semmes Wienstein monofilament intact to all pedal sites bilateral.  Negative Tinel sign.   Musculoskeletal: Tenderness to palpation along the plantar medial tubercle of the calcaneus at the insertion of plantar fascia on the left >> right foot. There is no pain along the course of the plantar fascia within the arch of the foot. Plantar fascia appears to be intact. There is no pain with lateral compression of the calcaneus or pain with vibratory sensation. There is no pain along the course or insertion of the achilles tendon. No other areas of tenderness to bilateral lower extremities. Muscular strength 5/5 in all groups tested bilateral.  Gait: Unassisted, Nonantalgic.      Assessment & Plan:  48 year old female with bilateral heel pain, plantar fasciitis, onychomycosis resulted in ingrown toenail currently without signs of infection; uncontrolled diabetes  -Treatment options discussed including all alternatives, risks, and complications -Etiology of symptoms were discussed -X-rays were obtained and reviewed with the patient.  There is no evidence of acute fracture or stress reaction. -Regards to her heel pain we discussed stretching, icing exercises daily.  Prescribed Voltaren gel.  We discussed shoe modifications and orthotics. Night splint  dispensed.  Due to blood sugar we held off on steroid injection. -In regards to the ingrown toenails currently asymptomatic.  I would recommend soaking Epson salt soaks and keep antibiotic ointment on it.  Should he become symptomatic again to let me know but given her uncontrolled diabetes will hold off on the partial nail avulsion today. Nail was debrided without issues and sent for culture  Vivi BarrackMatthew R Corene Resnick DPM

## 2018-11-08 ENCOUNTER — Other Ambulatory Visit: Payer: Self-pay | Admitting: Podiatry

## 2018-11-08 DIAGNOSIS — B351 Tinea unguium: Secondary | ICD-10-CM | POA: Diagnosis not present

## 2018-11-09 ENCOUNTER — Ambulatory Visit: Payer: Medicaid Other | Admitting: Physical Therapy

## 2018-11-09 ENCOUNTER — Encounter: Payer: Self-pay | Admitting: Physical Therapy

## 2018-11-09 ENCOUNTER — Other Ambulatory Visit: Payer: Self-pay

## 2018-11-09 DIAGNOSIS — M5412 Radiculopathy, cervical region: Secondary | ICD-10-CM

## 2018-11-09 DIAGNOSIS — M5442 Lumbago with sciatica, left side: Secondary | ICD-10-CM | POA: Diagnosis not present

## 2018-11-09 DIAGNOSIS — G8929 Other chronic pain: Secondary | ICD-10-CM | POA: Diagnosis not present

## 2018-11-09 DIAGNOSIS — M542 Cervicalgia: Secondary | ICD-10-CM | POA: Diagnosis not present

## 2018-11-09 DIAGNOSIS — R252 Cramp and spasm: Secondary | ICD-10-CM | POA: Diagnosis not present

## 2018-11-09 LAB — UNLABELED: Test Ordered On Req: 4553

## 2018-11-09 NOTE — Therapy (Signed)
[facility] [address] [place], [place], [address] Phone: [phone]   Fax:  [phone]  Physical Therapy Treatment  Patient Details  Name: Lindsey MainlandDeshawn Maultsby MRN: [id number] Date of Birth: January 10, 1971 Referring Provider (PT): Roseanna RainbowKao Ly    Encounter Date: 11/09/2018  PT End of Session - 11/09/18 1223    Visit Number  3    Number of Visits  13    Date for PT Re-Evaluation  12/12/18    Authorization Type  MCD New auth submited     Authorization - Visit Number  1    Authorization - Number of Visits  4    PT Start Time  1134    PT Stop Time  1222    PT Time Calculation (min)  48 min    Activity Tolerance  Patient tolerated treatment well    Behavior During Therapy  [facility] for tasks assessed/performed       Past Medical History:  Diagnosis Date  . Anemia   . 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 Diabetes mellitus without complication (HCC)   . Edema   . Lumbar pain   . Onychodystrophy   . Onychomycosis   . Paresthesia 05/17/2017  . Sleep apnea   . Thyroid disease   . TIA (transient ischemic attack) 05/26/2017  . 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 CHOLECYSTECTOMY    . DILATION AND CURETTAGE OF UTERUS    . HERNIA REPAIR      There were no vitals filed for this visit.  Subjective Assessment - 11/09/18 1138    Subjective  Returns for first treatment. No new complaints . Now working 3-8 hour days.   Heel pain, plantar fasciitis acting up.     Currently in Pain?  Yes    Pain Score  6     Pain Location  Shoulder    Pain Orientation  Left;Posterior;Lateral    Pain Descriptors / Indicators  Tingling;Sore    Pain Type  Chronic pain    Pain Onset  More than a month ago    Pain Frequency  Constant    Multiple Pain Sites  Yes    Pain Score  4    Pain Location  Back    Pain Orientation  Lower    Pain  Descriptors / Indicators  Aching    Pain Type  Chronic pain    Pain Onset  More than a month ago    Pain Frequency  Intermittent         [facility] Adult PT Treatment/Exercise - 11/09/18 0001      Neck Exercises: Theraband   Shoulder Extension  20 reps;Green    Rows  20 reps;Green      Lumbar Exercises: Stretches   Active Hamstring Stretch  3 reps;30 seconds    Single Knee to Chest Stretch  3 reps;20 seconds    Lower Trunk Rotation  10 seconds    Lower Trunk Rotation Limitations  x 10 with head turns       Lumbar Exercises: Supine   Pelvic Tilt  10 reps    Bridge  10 reps      Lumbar Exercises: Quadruped   Madcat/Old Horse  5 reps    Madcat/Old Horse Limitations  cat/cow     Other Quadruped Lumbar Exercises  childs pose x 3 with cues       Neck  Exercises: Stretches   Upper Trapezius Stretch  3 reps;30 seconds    Levator Stretch  3 reps;30 seconds             PT Education - 11/09/18 1155    Education Details  HEP, posture    Person(s) Educated  Patient    Methods  Explanation    Comprehension  Verbalized understanding       PT Short Term Goals - 11/09/18 1207      PT SHORT TERM GOAL #1   Title  Pt will be I with HEP for trunk flexibility and posture    Baseline  up to date     Status  On-going    Target Date  12/12/18      PT SHORT TERM GOAL #2   Title  Pt will report pain improved about 20-25% with turning head, basic mobiity tasks in the home.      Status  On-going    Target Date  12/12/18      PT SHORT TERM GOAL #3   Title  Pt will understand role of posture and mobiity in reducing pain, spasm and improved daily function.     Status  On-going    Target Date  12/12/18        PT Long Term Goals - 11/09/18 1210      PT LONG TERM GOAL #1   Title  Patient will demonstrate 70 degrees of cervical rotation bilateral without pain     Status  On-going      PT LONG TERM GOAL #2   Title  Patient will stand at work with <3/10 pain for 1 hour in order to  perfrom work tasks     Status  On-going      PT LONG TERM GOAL #3   Title  Pt will be able to stand as needed for home, cooking, washing dishes with pain no more than 3/10.     Baseline  1 hour then has to sit or stretch    Time  6    Target Date  12/12/18            Plan - 11/09/18 1204    Clinical Impression Statement  Patient arrived for in person visit and was able to complete all exercises with appropriate response.  Added tennis ball for self release, self care of MF trigger points.      Personal Factors and Comorbidities  Comorbidity 1;Comorbidity 2;Education;Time since onset of injury/illness/exacerbation;Past/Current Experience;Fitness    Examination-Activity Limitations  Sit;Sleep;Bend;Lift;Stairs;Locomotion Level;Carry;Stand;Reach Overhead    Examination-Participation Restrictions  Yard Work;Laundry;Cleaning;Community Activity;Shop    PT Treatment/Interventions  ADLs/Self Care Home Management;Electrical Stimulation;Moist Heat;Therapeutic exercise;Therapeutic activities;Functional mobility training;Stair training;Balance training;Patient/family education;Dry needling;Manual techniques;Spinal Manipulations    PT Next Visit Plan  Continue with postural correction and core strengthening continue manual therapy to cervical and lumbar spine as needed     PT Home Exercise Plan  LTR with head turns, knee to chest and supine post pelvic tilt, neck stretches, Row, use tennis ball for pain in scapula/hip and foot     Consulted and Agree with Plan of Care  Patient       Patient will benefit from skilled therapeutic intervention in order to improve the following deficits and impairments:  Decreased endurance, Decreased mobility, Difficulty walking, Hypomobility, Increased muscle spasms, Obesity, Improper body mechanics, Decreased range of motion, Decreased activity tolerance, Decreased strength, Increased fascial restricitons, Impaired flexibility, Postural dysfunction, Pain  Visit  Diagnosis: Chronic left-sided low  back pain with left-sided sciatica  Cervicalgia  Radiculopathy, cervical region  Cramp and spasm     Problem List Patient Active Problem List   Diagnosis Date Noted  . History of TIA (transient ischemic attack) 06/29/2017  . Slurred speech 06/28/2017  . 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 Essential hypertension 05/27/2017  . Hyperlipidemia LDL goal <70 05/27/2017  . Hypothyroidism 05/27/2017  . TIA (transient ischemic attack) 05/26/2017  . Paresthesia 05/17/2017  . Iron deficiency 04/19/2016  . Sleep apnea 04/19/2016  . Chronic bilateral low back pain with left-sided sciatica 02/13/2016  . Onychodystrophy 08/26/2015  . Onychomycosis 08/26/2015    Abdo Denault 11/09/2018, 1:32 PM  [facility] [address] [place], [place], [address] Phone: [phone]   Fax:  [phone]  Name: Meiya Wisler MRN: [id number] Date of Birth: 1970-10-21  Karie Bowen, PT 11/09/18 1:33 PM Phone: [phone] Fax: [phone]

## 2018-11-13 ENCOUNTER — Other Ambulatory Visit: Payer: Self-pay

## 2018-11-13 ENCOUNTER — Encounter: Payer: Self-pay | Admitting: Physical Therapy

## 2018-11-13 ENCOUNTER — Other Ambulatory Visit: Payer: Self-pay | Admitting: Nurse Practitioner

## 2018-11-13 ENCOUNTER — Ambulatory Visit: Payer: Medicaid Other | Admitting: Physical Therapy

## 2018-11-13 DIAGNOSIS — E1165 Type 2 diabetes mellitus with hyperglycemia: Secondary | ICD-10-CM

## 2018-11-13 DIAGNOSIS — G8929 Other chronic pain: Secondary | ICD-10-CM

## 2018-11-13 DIAGNOSIS — M5442 Lumbago with sciatica, left side: Secondary | ICD-10-CM | POA: Diagnosis not present

## 2018-11-13 DIAGNOSIS — M5412 Radiculopathy, cervical region: Secondary | ICD-10-CM | POA: Diagnosis not present

## 2018-11-13 DIAGNOSIS — M542 Cervicalgia: Secondary | ICD-10-CM

## 2018-11-13 DIAGNOSIS — R252 Cramp and spasm: Secondary | ICD-10-CM

## 2018-11-13 NOTE — Therapy (Signed)
[facility] [address] [place], [place], [address] Phone: [phone]   Fax:  [phone]  Physical Therapy Treatment  Patient Details  Name: Lindsey MainlandDeshawn Bowen MRN: [id number] Date of Birth: 07-30-1970 Referring Provider (PT): Roseanna RainbowKao Ly    Encounter Date: 11/13/2018  PT End of Session - 11/13/18 1146    Visit Number  4    Number of Visits  13    Date for PT Re-Evaluation  12/12/18    Authorization Type  MCD New auth submited     Authorization Time Period  5/12 to 6/11     Authorization - Visit Number  2    Authorization - Number of Visits  4    PT Start Time  1140   arr late    PT Stop Time  1225    PT Time Calculation (min)  45 min    Activity Tolerance  Patient tolerated treatment well    Behavior During Therapy  [facility] for tasks assessed/performed       Past Medical History:  Diagnosis Date  . Anemia   . 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 Diabetes mellitus without complication (HCC)   . Edema   . Lumbar pain   . Onychodystrophy   . Onychomycosis   . Paresthesia 05/17/2017  . Sleep apnea   . Thyroid disease   . TIA (transient ischemic attack) 05/26/2017  . 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 CHOLECYSTECTOMY    . DILATION AND CURETTAGE OF UTERUS    . HERNIA REPAIR      There were no vitals filed for this visit.  Subjective Assessment - 11/13/18 1144    Subjective  Worked this weekend full shifts for the first time.  Back pain up to 7/10.  Saw podiatrist. None now.  Rt leg feels week.     Currently in Pain?  No/denies          [facility] Treatment/Exercise - 11/13/18 0001      Lumbar Exercises: Stretches   Active Hamstring Stretch  3 reps;30 seconds    Single Knee to Chest Stretch  3 reps;20 seconds    Lower Trunk Rotation  10 seconds    Lower Trunk Rotation Limitations  x  10 with head turns       Lumbar Exercises: Aerobic   Nustep  6 min L5 LEs only       Lumbar Exercises: Supine   Ab Set  10 reps    AB Set Limitations  ball squeeze     Clam  10 reps    Bent Knee Raise  10 reps    Bridge  10 reps    Bridge Limitations  with ball     Straight Leg Raise  10 reps      Modalities   Modalities  Moist Heat      Moist Heat Therapy   Number Minutes Moist Heat  10 Minutes    Moist Heat Location  Lumbar Spine             PT Education - 11/13/18 1208    Education Details  core routine     Person(s) Educated  Patient    Methods  Explanation;Demonstration;Handout;Verbal cues    Comprehension  Verbalized understanding;Returned demonstration       PT Short Term Goals - 11/09/18 1207      PT SHORT  TERM GOAL #1   Title  Pt will be I with HEP for trunk flexibility and posture    Baseline  up to date     Status  On-going    Target Date  12/12/18      PT SHORT TERM GOAL #2   Title  Pt will report pain improved about 20-25% with turning head, basic mobiity tasks in the home.      Status  On-going    Target Date  12/12/18      PT SHORT TERM GOAL #3   Title  Pt will understand role of posture and mobiity in reducing pain, spasm and improved daily function.     Status  On-going    Target Date  12/12/18        PT Long Term Goals - 11/09/18 1210      PT LONG TERM GOAL #1   Title  Patient will demonstrate 70 degrees of cervical rotation bilateral without pain     Status  On-going      PT LONG TERM GOAL #2   Title  Patient will stand at work with <3/10 pain for 1 hour in order to perfrom work tasks     Status  On-going      PT LONG TERM GOAL #3   Title  Pt will be able to stand as needed for home, cooking, washing dishes with pain no more than 3/10.     Baseline  1 hour then has to sit or stretch    Time  6    Target Date  12/12/18            Plan - 11/13/18 1146    Clinical Impression Statement  Patient had no pain today during  session but did have mod to severe over the weekend with an 8 hour shift at work.  Gave core education and routine to further functional mobility.     PT Treatment/Interventions  ADLs/Self Care Home Management;Electrical Stimulation;Moist Heat;Therapeutic exercise;Therapeutic activities;Functional mobility training;Stair training;Balance training;Patient/family education;Dry needling;Manual techniques;Spinal Manipulations    PT Next Visit Plan  Cont core and begin loading with Kettlebell, Dead lift, squat     PT Home Exercise Plan  LTR with head turns, knee to chest and supine post pelvic tilt, neck stretches, Row, use tennis ball for pain in scapula/hip and foot     Consulted and Agree with Plan of Care  Patient       Patient will benefit from skilled therapeutic intervention in order to improve the following deficits and impairments:  Decreased endurance, Decreased mobility, Difficulty walking, Hypomobility, Increased muscle spasms, Obesity, Improper body mechanics, Decreased range of motion, Decreased activity tolerance, Decreased strength, Increased fascial restricitons, Impaired flexibility, Postural dysfunction, Pain  Visit Diagnosis: Chronic left-sided low back pain with left-sided sciatica  Cervicalgia  Radiculopathy, cervical region  Cramp and spasm     Problem List Patient Active Problem List   Diagnosis Date Noted  . History of TIA (transient ischemic attack) 06/29/2017  . Slurred speech 06/28/2017  . 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 Essential hypertension 05/27/2017  . Hyperlipidemia LDL goal <70 05/27/2017  . Hypothyroidism 05/27/2017  . TIA (transient ischemic attack) 05/26/2017  . Paresthesia 05/17/2017  . Iron deficiency 04/19/2016  . Sleep apnea 04/19/2016  . Chronic bilateral low back pain with left-sided sciatica 02/13/2016  . Onychodystrophy  08/26/2015  . Onychomycosis 08/26/2015  , 11/13/2018, 12:25 PM  [facility] [address] [place], [place], [address] Phone: [phone]   Fax:  [phone]  Name: Kierra Jezewski MRN: [id number] Date of Birth: Jul 15, 1970   Karie Bowen, PT 11/13/18 12:26 PM Phone: [phone] Fax: [phone]

## 2018-11-14 ENCOUNTER — Ambulatory Visit: Payer: Medicaid Other | Attending: Nurse Practitioner

## 2018-11-14 ENCOUNTER — Other Ambulatory Visit (HOSPITAL_COMMUNITY)
Admission: RE | Admit: 2018-11-14 | Discharge: 2018-11-14 | Disposition: A | Discharge status: Home / Self Care | Payer: Medicaid Other | Source: Ambulatory Visit | Attending: Nurse Practitioner | Admitting: Nurse Practitioner

## 2018-11-14 DIAGNOSIS — E1165 Type 2 diabetes mellitus with hyperglycemia: Secondary | ICD-10-CM | POA: Insufficient documentation

## 2018-11-14 DIAGNOSIS — N760 Acute vaginitis: Secondary | ICD-10-CM | POA: Diagnosis not present

## 2018-11-14 NOTE — Progress Notes (Signed)
Patient came in to drop off urine for cytology and labs.

## 2018-11-15 LAB — CBC
Hematocrit: 36.8 % (ref 34.0–46.6)
Hemoglobin: 12 g/dL (ref 11.1–15.9)
MCH: 23 pg — ABNORMAL LOW (ref 26.6–33.0)
MCHC: 32.6 g/dL (ref 31.5–35.7)
MCV: 71 fL — ABNORMAL LOW (ref 79–97)
Platelets: 263 10*3/uL (ref 150–450)
RBC: 5.22 x10E6/uL (ref 3.77–5.28)
RDW: 13.5 % (ref 11.7–15.4)
WBC: 5.6 10*3/uL (ref 3.4–10.8)

## 2018-11-15 LAB — CMP14+EGFR
ALT: 15 IU/L (ref 0–32)
AST: 13 IU/L (ref 0–40)
Albumin/Globulin Ratio: 1.6 (ref 1.2–2.2)
Albumin: 3.9 g/dL (ref 3.8–4.8)
Alkaline Phosphatase: 64 IU/L (ref 39–117)
BUN/Creatinine Ratio: 12 (ref 9–23)
BUN: 10 mg/dL (ref 6–24)
Bilirubin Total: 0.3 mg/dL (ref 0.0–1.2)
CO2: 23 mmol/L (ref 20–29)
Calcium: 9.1 mg/dL (ref 8.7–10.2)
Chloride: 99 mmol/L (ref 96–106)
Creatinine, Ser: 0.85 mg/dL (ref 0.57–1.00)
GFR calc Af Amer: 95 mL/min/{1.73_m2} (ref >59)
GFR calc non Af Amer: 82 mL/min/{1.73_m2} (ref >59)
Globulin, Total: 2.5 g/dL (ref 1.5–4.5)
Glucose: 228 mg/dL — ABNORMAL HIGH (ref 65–99)
Potassium: 3.7 mmol/L (ref 3.5–5.2)
Sodium: 134 mmol/L (ref 134–144)
Total Protein: 6.4 g/dL (ref 6.0–8.5)

## 2018-11-15 LAB — URINE CYTOLOGY ANCILLARY ONLY
Chlamydia: NEGATIVE
Neisseria Gonorrhea: NEGATIVE
Trichomonas: NEGATIVE

## 2018-11-16 ENCOUNTER — Encounter: Payer: Self-pay | Admitting: Physical Therapy

## 2018-11-16 ENCOUNTER — Ambulatory Visit: Payer: Medicaid Other | Admitting: Physical Therapy

## 2018-11-16 ENCOUNTER — Telehealth: Payer: Self-pay

## 2018-11-16 ENCOUNTER — Other Ambulatory Visit: Payer: Self-pay

## 2018-11-16 DIAGNOSIS — G8929 Other chronic pain: Secondary | ICD-10-CM

## 2018-11-16 DIAGNOSIS — M542 Cervicalgia: Secondary | ICD-10-CM | POA: Diagnosis not present

## 2018-11-16 DIAGNOSIS — M5442 Lumbago with sciatica, left side: Secondary | ICD-10-CM | POA: Diagnosis not present

## 2018-11-16 DIAGNOSIS — R252 Cramp and spasm: Secondary | ICD-10-CM

## 2018-11-16 DIAGNOSIS — M5412 Radiculopathy, cervical region: Secondary | ICD-10-CM | POA: Diagnosis not present

## 2018-11-16 NOTE — Therapy (Signed)
[facility] Outpatient Rehabilitation [facility] [address] [place], [place], [address] Phone: [phone]   Fax:  [phone]  Physical Therapy Treatment  Patient Details  Name: Lindsey Bowen MRN: [id number] Date of Birth: 04/28/1971 Referring Provider (PT): Roseanna Rainbow    Encounter Date: 11/16/2018  PT End of Session - 11/16/18 1151    Visit Number  5    Number of Visits  13    Date for PT Re-Evaluation  12/12/18    Authorization Type  MCD New auth submited     Authorization Time Period  5/12 to 6/11     Authorization - Visit Number  3    Authorization - Number of Visits  4    PT Start Time  1145    PT Stop Time  1220    PT Time Calculation (min)  35 min    Activity Tolerance  Patient tolerated treatment well    Behavior During Therapy  [facility] for tasks assessed/performed       Past Medical History:  Diagnosis Date  . Anemia   . 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 Diabetes mellitus without complication (HCC)   . Edema   . Lumbar pain   . Onychodystrophy   . Onychomycosis   . Paresthesia 05/17/2017  . Sleep apnea   . Thyroid disease   . TIA (transient ischemic attack) 05/26/2017  . 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 CHOLECYSTECTOMY    . DILATION AND CURETTAGE OF UTERUS    . HERNIA REPAIR      There were no vitals filed for this visit.  Subjective Assessment - 11/16/18 1149    Subjective  Don't feel great, blood sugar issues.  Back is about 3/10.  Started late, not checked in on time.     Currently in Pain?  Yes    Pain Score  3     Pain Location  Back               [facility] Adult PT Treatment/Exercise - 11/16/18 0001      Neck Exercises: Theraband   Shoulder Extension  20 reps;Green    Rows  20 reps;Green      Lumbar Exercises: Aerobic   Nustep  5 min LE only , L5       Lumbar Exercises: Standing   Wall  Slides  20 reps    Wall Slides Limitations  2 x 10     Row  Strengthening;Both;20 reps;Theraband    Theraband Level (Row)  Level 3 (Green)    Shoulder Extension  Strengthening;Both;20 reps;Theraband    Theraband Level (Shoulder Extension)  Level 3 (Green)      Lumbar Exercises: Supine   Ab Set  10 reps    AB Set Limitations  ball squeeze     Clam  10 reps    Clam Limitations  used ball to destabilize     Bridge  10 reps    Bridge Limitations  with ball     Large Ball Abdominal Isometric  10 reps    Other Supine Lumbar Exercises  single leg hip flex/ext on ball for core x 10 each side       Moist Heat Therapy   Number Minutes Moist Heat  10 Minutes    Moist Heat Location  Lumbar Spine  PT Short Term Goals - 11/09/18 1207      PT SHORT TERM GOAL #1   Title  Pt will be I with HEP for trunk flexibility and posture    Baseline  up to date     Status  On-going    Target Date  12/12/18      PT SHORT TERM GOAL #2   Title  Pt will report pain improved about 20-25% with turning head, basic mobiity tasks in the home.      Status  On-going    Target Date  12/12/18      PT SHORT TERM GOAL #3   Title  Pt will understand role of posture and mobiity in reducing pain, spasm and improved daily function.     Status  On-going    Target Date  12/12/18        PT Long Term Goals - 11/09/18 1210      PT LONG TERM GOAL #1   Title  Patient will demonstrate 70 degrees of cervical rotation bilateral without pain     Status  On-going      PT LONG TERM GOAL #2   Title  Patient will stand at work with <3/10 pain for 1 hour in order to perfrom work tasks     Status  On-going      PT LONG TERM GOAL #3   Title  Pt will be able to stand as needed for home, cooking, washing dishes with pain no more than 3/10.     Baseline  1 hour then has to sit or stretch    Time  6    Target Date  12/12/18            Plan - 11/16/18 1539    Clinical Impression Statement  Pt  tolerated session well, worked mostly on core in supine and standing.      PT Treatment/Interventions  ADLs/Self Care Home Management;Electrical Stimulation;Moist Heat;Therapeutic exercise;Therapeutic activities;Functional mobility training;Stair training;Balance training;Patient/family education;Dry needling;Manual techniques;Spinal Manipulations    PT Next Visit Plan  Cont core and begin loading with Kettlebell, Dead lift, squat     PT Home Exercise Plan  LTR with head turns, knee to chest and supine post pelvic tilt, neck stretches, Row, use tennis ball for pain in scapula/hip and foot     Consulted and Agree with Plan of Care  Patient       Patient will benefit from skilled therapeutic intervention in order to improve the following deficits and impairments:  Decreased endurance, Decreased mobility, Difficulty walking, Hypomobility, Increased muscle spasms, Obesity, Improper body mechanics, Decreased range of motion, Decreased activity tolerance, Decreased strength, Increased fascial restricitons, Impaired flexibility, Postural dysfunction, Pain  Visit Diagnosis: Chronic left-sided low back pain with left-sided sciatica  Cervicalgia  Radiculopathy, cervical region  Cramp and spasm     Problem List Patient Active Problem List   Diagnosis Date Noted  . History of TIA (transient ischemic attack) 06/29/2017  . Slurred speech 06/28/2017  . 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 Essential hypertension 05/27/2017  . Hyperlipidemia LDL goal <70 05/27/2017  . Hypothyroidism 05/27/2017  . TIA (transient ischemic attack) 05/26/2017  . Paresthesia 05/17/2017  . Iron deficiency 04/19/2016  . Sleep apnea 04/19/2016  . Chronic bilateral low back pain with left-sided sciatica 02/13/2016  . Onychodystrophy 08/26/2015  . Onychomycosis 08/26/2015    Lindsey Bowen 11/16/2018, 3:43  PM  [facility] Outpatient Rehabilitation [facility] [address] [place], [place], [address] Phone: [phone]   Fax:  [phone]  Name: Lindsey Bowen MRN: [id number] Date of Birth: September 14, 1970   Karie Mainland, PT 11/16/18 3:43 PM Phone: [phone] Fax: [phone]

## 2018-11-16 NOTE — Telephone Encounter (Signed)
-----   Message from Claiborne Rigg, NP sent at 11/15/2018  7:28 PM EDT ----- Labs show anemia has improved. Liver and kidney function are normal. Still awaiting additional urine testing for bacteria/infection

## 2018-11-16 NOTE — Telephone Encounter (Signed)
CMA spoke to patient to inform on results.  Pt. Verified DOB. Pt. Understood.  

## 2018-11-17 LAB — URINE CYTOLOGY ANCILLARY ONLY
Bacterial vaginitis: NEGATIVE
Candida vaginitis: NEGATIVE

## 2018-11-21 ENCOUNTER — Ambulatory Visit: Payer: Medicaid Other | Admitting: Physical Therapy

## 2018-11-22 NOTE — Telephone Encounter (Signed)
-----   Message from Claiborne Rigg, NP sent at 11/20/2018  1:34 PM EDT ----- Remaining results negative for gonorrhea, trichomonas, chlamydia, bacterial vaginosis or yeast.

## 2018-11-22 NOTE — Telephone Encounter (Signed)
CMA attempt to reach patient to inform on results.  No answer and left a VM for a call back.  

## 2018-11-23 ENCOUNTER — Ambulatory Visit: Payer: Medicaid Other | Admitting: Physical Therapy

## 2018-11-24 ENCOUNTER — Telehealth: Payer: Self-pay | Admitting: Nurse Practitioner

## 2018-11-24 NOTE — Telephone Encounter (Signed)
Patient called to get their results. Please follow up. °

## 2018-11-27 DIAGNOSIS — E1165 Type 2 diabetes mellitus with hyperglycemia: Secondary | ICD-10-CM | POA: Diagnosis not present

## 2018-11-27 NOTE — Telephone Encounter (Signed)
CMA spoke to patient and inform on lab results.  Pt. Verified DOB. Pt. Understood.

## 2018-11-28 ENCOUNTER — Ambulatory Visit: Payer: Medicaid Other | Attending: Nurse Practitioner | Admitting: Physical Therapy

## 2018-11-28 ENCOUNTER — Telehealth: Payer: Self-pay | Admitting: Physical Therapy

## 2018-11-28 ENCOUNTER — Encounter: Payer: Self-pay | Admitting: Physical Therapy

## 2018-11-28 DIAGNOSIS — M542 Cervicalgia: Secondary | ICD-10-CM | POA: Insufficient documentation

## 2018-11-28 DIAGNOSIS — M5442 Lumbago with sciatica, left side: Secondary | ICD-10-CM | POA: Insufficient documentation

## 2018-11-28 DIAGNOSIS — R252 Cramp and spasm: Secondary | ICD-10-CM | POA: Insufficient documentation

## 2018-11-28 DIAGNOSIS — G8929 Other chronic pain: Secondary | ICD-10-CM | POA: Insufficient documentation

## 2018-11-28 DIAGNOSIS — M5412 Radiculopathy, cervical region: Secondary | ICD-10-CM | POA: Insufficient documentation

## 2018-11-28 NOTE — Telephone Encounter (Signed)
Called patient regarding missed appt today at 11:00.  Left voicemail asking her to call clinic if she no longer needed PT.  Reminded her of her next appt June 4th at 11:30. Karie Mainland, PT 11/28/18 11:22 AM Phone: [phone] Fax: [phone]

## 2018-11-30 ENCOUNTER — Encounter: Payer: Self-pay | Admitting: Physical Therapy

## 2018-11-30 ENCOUNTER — Ambulatory Visit: Payer: Medicaid Other | Admitting: Physical Therapy

## 2018-11-30 ENCOUNTER — Other Ambulatory Visit: Payer: Self-pay

## 2018-11-30 DIAGNOSIS — G8929 Other chronic pain: Secondary | ICD-10-CM | POA: Diagnosis not present

## 2018-11-30 DIAGNOSIS — R252 Cramp and spasm: Secondary | ICD-10-CM

## 2018-11-30 DIAGNOSIS — M5442 Lumbago with sciatica, left side: Secondary | ICD-10-CM | POA: Diagnosis not present

## 2018-11-30 DIAGNOSIS — M542 Cervicalgia: Secondary | ICD-10-CM

## 2018-11-30 DIAGNOSIS — M5412 Radiculopathy, cervical region: Secondary | ICD-10-CM | POA: Diagnosis not present

## 2018-11-30 NOTE — Patient Instructions (Signed)
Prepared By: Karie Mainland [address] [place], [place] Supine Shoulder Horizontal Abduction with Resistance REPS: 10  SETS: 2  HOLD: 30  DAILY: 1  WEEKLY: 7 Setup Setup Directions Movement Begin lying on your back with your knees bent and the ends of a resistance band in each hand. Your arms should be straight up toward the ceiling. Tip Pull your arms apart against the resistance band, straight out to your sides, then slowly bring them back to the starting position and repeat. STEP 1 STEP 2 Supine Bridge REPS: 10  SETS: 2  HOLD: 5  DAILY: 1  WEEKLY: 7 Setup Begin lying on your back with your arms resting at your sides, your legs bent at the knees and your feet flat on the ground. Movement Tighten your abdominals and slowly lift your hips off the floor into a bridge position, keeping your back straight. Tip Make sure to keep your trunk stiff throughout the exercise and your arms flat on the floor.

## 2018-11-30 NOTE — Therapy (Signed)
[place], [place], [address] Phone: [phone]   Fax:  [phone]  Physical Therapy Treatment/Re-Eval/Discharge   Patient Details  Name: Lindsey Bowen MRN: [id number] Date of Birth: 01/15/1971 Referring Provider (PT): Elfredia Nevins    Encounter Date: 11/30/2018  PT End of Session - 11/30/18 1150    Visit Number  6    Number of Visits  13    Date for PT Re-Evaluation  12/12/18    Authorization Type  MCD New auth submited     Authorization Time Period  5/12 to 6/11     Authorization - Visit Number  4    Authorization - Number of Visits  4    PT Start Time  1143   late for 11:30 appt    PT Stop Time  1216    PT Time Calculation (min)  33 min    Activity Tolerance  Patient tolerated treatment well    Behavior During Therapy  [facility] for tasks assessed/performed       Past Medical History:  Diagnosis Date  . Anemia   . Diabetes mellitus type 2 with neurological manifestations ([place]) 05/27/2017  . Diabetes mellitus without complication ([place])   . Edema   . Lumbar pain   . Onychodystrophy   . Onychomycosis   . Paresthesia 05/17/2017  . Sleep apnea   . Thyroid disease   . TIA (transient ischemic attack) 05/26/2017  . Type 2 diabetes mellitus with microalbuminuria ([place]) 05/27/2017    Past Surgical History:  Procedure Laterality Date  . CHOLECYSTECTOMY    . DILATION AND CURETTAGE OF UTERUS    . HERNIA REPAIR      There were no vitals filed for this visit.  Subjective Assessment - 11/30/18 1144    Subjective  Had to work this week and was really tired.  Missed Tues appt. No pain in neck and low back.   Some foot pain.     Currently in Pain?  No/denies         [facility] PT Assessment - 11/30/18 0001      AROM   Cervical Flexion  50    Cervical Extension  40    Cervical - Right Side Bend  50    Cervical - Left Side Bend  40    Cervical - Right Rotation  WFL     Cervical - Left Rotation  WFL tight on  L     Lumbar Flexion  WFL    Lumbar Extension  WFL no pain     Lumbar - Right Side Bend  WFL    Lumbar - Left Side Bend  WFL    Lumbar - Right Rotation  WFL    Lumbar - Left Rotation  [facility]           [facility] Adult PT Treatment/Exercise - 11/30/18 0001      Lumbar Exercises: Stretches   Active Hamstring Stretch  3 reps    Single Knee to Chest Stretch  3 reps    Lower Trunk Rotation  10 seconds    Lower Trunk Rotation Limitations  x 10 with head turns       Lumbar Exercises: Standing   Row  Strengthening;Both;20 reps;Theraband    Theraband Level (Row)  Level 3 (Green)    Shoulder Extension  Strengthening;Both;20 reps;Theraband    Theraband Level (Shoulder Extension)  Level 3 (Green)      Lumbar Exercises: Supine   Other Supine Lumbar Exercises  supine horizonal pull x 10 with core engaged       Neck Exercises: Stretches   Upper Trapezius Stretch Limitations  2 x 30 sec              PT Education - 11/30/18 1237    Education Details  HEP , reviewed current and added on, lifting 20 lbs floor to waist    Person(s) Educated  Patient    Methods  Explanation;Demonstration;Verbal cues;Handout    Comprehension  Verbalized understanding;Returned demonstration       PT Short Term Goals - 11/30/18 1151      PT SHORT TERM GOAL #1   Title  Pt will be I with HEP for trunk flexibility and posture    Status  Achieved      PT SHORT TERM GOAL #2   Title  Pt will report pain improved about 20-25% with turning head, basic mobiity tasks in the home.      Status  Achieved      PT SHORT TERM GOAL #3   Title  Pt will understand role of posture and mobiity in reducing pain, spasm and improved daily function.     Status  Achieved        PT Long Term Goals - 11/30/18 1152      PT LONG TERM GOAL #1   Title  Patient will demonstrate 70 degrees of cervical rotation bilateral without pain     Status  Achieved      PT LONG TERM GOAL #2   Title  Patient will stand at work with <3/10  pain for 1 hour in order to perfrom work tasks     Status  Achieved      PT LONG TERM GOAL #3   Title  Pt will be able to stand as needed for home, cooking, washing dishes with pain no more than 3/10.     Status  Achieved            Plan - 11/30/18 1238    Clinical Impression Statement  Patient has not had pain in many days.  She has been good about doing her exercises. She feels ready for DC and has met all goals.      PT Treatment/Interventions  ADLs/Self Care Home Management;Electrical Stimulation;Moist Heat;Therapeutic exercise;Therapeutic activities;Functional mobility training;Stair training;Balance training;Patient/family education;Dry needling;Manual techniques;Spinal Manipulations    PT Next Visit Plan  NA     PT Home Exercise Plan  bridge and horizontal pull, LTR with head turns, knee to chest and supine post pelvic tilt, neck stretches, Row, use tennis ball for pain in scapula/hip and foot     Consulted and Agree with Plan of Care  Patient       Patient will benefit from skilled therapeutic intervention in order to improve the following deficits and impairments:  Decreased endurance, Decreased mobility, Difficulty walking, Hypomobility, Increased muscle spasms, Obesity, Improper body mechanics, Decreased range of motion, Decreased activity tolerance, Decreased strength, Increased fascial restricitons, Impaired flexibility, Postural dysfunction, Pain  Visit Diagnosis: Chronic left-sided low back pain with left-sided sciatica  Cervicalgia  Radiculopathy, cervical region  Cramp and spasm     Problem List Patient Active Problem List   Diagnosis Date Noted  . History of TIA (transient ischemic attack) 06/29/2017  . Slurred speech 06/28/2017  . Type 2 diabetes mellitus with microalbuminuria ([place]) 05/27/2017  . Diabetes mellitus type 2 with neurological manifestations ([place]) 05/27/2017  . Essential hypertension 05/27/2017  . Hyperlipidemia LDL goal <70 05/27/2017  .  Hypothyroidism 05/27/2017  . TIA (transient ischemic attack) 05/26/2017  . Paresthesia 05/17/2017  . Iron deficiency 04/19/2016  . Sleep apnea 04/19/2016  . Chronic bilateral low back pain with left-sided sciatica 02/13/2016  . Onychodystrophy 08/26/2015  . Onychomycosis 08/26/2015    Loann Chahal 11/30/2018, 12:40 PM  [facility] [address] [place], [place], [address] Phone: [phone]   Fax:  [phone]  Name: Lindsey Bowen MRN: [id number] Date of Birth: 12-16-1970   PHYSICAL THERAPY DISCHARGE SUMMARY  Visits from Start of Care: 6  Current functional level related to goals / functional outcomes: See above, No limitations in neck and back.    Remaining deficits: Core strength, endurance.  Not affecting function.    Education / Equipment: HEP, lifting, body mechanics, core , tennis ball for self release  Plan: Patient agrees to discharge.  Patient goals were met. Patient is being discharged due to meeting the stated rehab goals.  ?????         Raeford Razor, PT 11/30/18 12:40 PM Phone: [phone] Fax: [phone]

## 2018-12-05 ENCOUNTER — Ambulatory Visit: Payer: Medicaid Other | Admitting: Podiatry

## 2018-12-05 ENCOUNTER — Ambulatory Visit: Payer: Medicaid Other | Admitting: Physical Therapy

## 2018-12-05 DIAGNOSIS — Z1231 Encounter for screening mammogram for malignant neoplasm of breast: Secondary | ICD-10-CM | POA: Diagnosis not present

## 2018-12-07 ENCOUNTER — Encounter: Payer: Medicaid Other | Admitting: Physical Therapy

## 2018-12-07 ENCOUNTER — Ambulatory Visit: Payer: Medicaid Other | Admitting: Physical Therapy

## 2018-12-08 DIAGNOSIS — H40052 Ocular hypertension, left eye: Secondary | ICD-10-CM | POA: Diagnosis not present

## 2018-12-08 DIAGNOSIS — H40013 Open angle with borderline findings, low risk, bilateral: Secondary | ICD-10-CM | POA: Diagnosis not present

## 2018-12-11 LAB — CULT, FUNGUS, SKIN,HAIR,NAIL W/KOH
MICRO NUMBER:: 490999
SMEAR:: NONE SEEN
SPECIMEN QUALITY:: ADEQUATE

## 2018-12-11 LAB — PAT ID TIQ DOC: Test Affected: 4553

## 2018-12-13 ENCOUNTER — Encounter: Payer: Self-pay | Admitting: *Deleted

## 2018-12-13 ENCOUNTER — Telehealth: Payer: Self-pay | Admitting: *Deleted

## 2018-12-13 NOTE — Telephone Encounter (Signed)
Female voice states I have called a wrong number.

## 2018-12-13 NOTE — Telephone Encounter (Signed)
Mailed letter to pt with Dr. Leigh Aurora review of results and orders.

## 2018-12-13 NOTE — Telephone Encounter (Signed)
-----   Message from Trula Slade, DPM sent at 12/13/2018 10:23 AM EDT ----- Val- please let her know that the nail culture did come back as growing a yeast. We can do oral fluconazole 300mg  weekly x 6 weeks and have her follow up. If she does not want oral then we can do topical from [place].

## 2018-12-26 DIAGNOSIS — E1165 Type 2 diabetes mellitus with hyperglycemia: Secondary | ICD-10-CM | POA: Diagnosis not present

## 2018-12-27 ENCOUNTER — Other Ambulatory Visit: Payer: Self-pay

## 2018-12-27 ENCOUNTER — Telehealth: Payer: Self-pay | Admitting: *Deleted

## 2018-12-27 ENCOUNTER — Ambulatory Visit: Payer: Medicaid Other | Attending: Family Medicine | Admitting: Family Medicine

## 2018-12-27 DIAGNOSIS — Z20828 Contact with and (suspected) exposure to other viral communicable diseases: Secondary | ICD-10-CM

## 2018-12-27 DIAGNOSIS — M791 Myalgia, unspecified site: Secondary | ICD-10-CM | POA: Diagnosis not present

## 2018-12-27 DIAGNOSIS — R0982 Postnasal drip: Secondary | ICD-10-CM

## 2018-12-27 DIAGNOSIS — Z20822 Contact with and (suspected) exposure to covid-19: Secondary | ICD-10-CM

## 2018-12-27 MED ORDER — CETIRIZINE HCL 10 MG PO TABS: 10.0000 mg | ORAL_TABLET | Freq: Every day | ORAL | 1 refills | Status: DC

## 2018-12-27 NOTE — Progress Notes (Signed)
Patient has been called and DOB has been verified. Patient has been screened and transferred to PCP to start phone visit.  Patient states that supervisor tested positive for covid-19

## 2018-12-27 NOTE — Progress Notes (Signed)
Virtual Visit via Telephone Note  I connected with Lindsey Bowen, on 12/27/2018 at 2:54 PM by telephone due to the COVID-19 pandemic and verified that I am speaking with the correct person using two identifiers.   Consent: I discussed the limitations, risks, security and privacy concerns of performing an evaluation and management service by telephone and the availability of in person appointments. I also discussed with the patient that there may be a patient responsible charge related to this service. The patient expressed understanding and agreed to proceed.   Location of Patient: Home  Location of Provider: Clinic   Persons participating in Telemedicine visit: Tamya Caryl PinaColbert Alicia Farrington-CMA Dr. Nelwyn SalisburyNewlin-PCP     History of Present Illness: 48 year old female with a history of type 2 diabetes mellitus, TIA, hypertension who called in for same-day appointment due to exposure to a person with COVID-19 at her place of work.  She works in [employer] and her supervisor tested positive.  Her last exposure to him was 10 days ago and she endorses wearing a mask and the supervisor also wearing a mask at work. On questioning about her symptoms she endorses exertion for the past few weeks, " cold in her throat" with associated mucus and is having to clear her throat a lot.  She endorses myalgias but this is not new for her as she always has body aches and she has had some nausea.  She informs me she has had loose stools for 2 weeks.  She denies fever, dyspnea, chest pains.   Past Medical History:  Diagnosis Date  . Anemia   . 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 Diabetes mellitus without complication (HCC)   . Edema   . Lumbar pain   . Onychodystrophy   . Onychomycosis   . Paresthesia 05/17/2017  . Sleep apnea   . Thyroid disease   . TIA (transient ischemic attack) 05/26/2017  . 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    Allergies  Allergen Reactions  . Dust Mite Extract Hives and Itching  . Shellfish Allergy Anaphylaxis    Current Outpatient Medications on File Prior to Visit  Medication Sig Dispense Refill  . acetaminophen (TYLENOL) 650 MG CR tablet Take 650 mg by mouth every 8 (eight) hours as needed for pain.    Marland Kitchen. albuterol (VENTOLIN HFA) 108 (90 Base) MCG/ACT inhaler Inhale into the lungs.    Marland Kitchen. aspirin 81 MG chewable tablet Chew 1 tablet (81 mg total) by mouth daily. 30 tablet 0  . atorvastatin (LIPITOR) 80 MG tablet Take 1 tablet (80 mg total) by mouth daily at 6 PM. 30 tablet 0  . cyclobenzaprine (FLEXERIL) 5 MG tablet Take 5 mg by mouth 3 (three) times daily as needed for muscle spasms.    . diclofenac sodium (VOLTAREN) 1 % GEL Apply 2 g topically 4 (four) times daily. Rub into affected area of foot 2 to 4 times daily 100 g 2  . Dulaglutide (TRULICITY) 1.5 MG/0.5ML SOPN Inject 1.5 mg into the skin once a week.     . fluticasone (FLONASE) 50 MCG/ACT nasal spray Place 2 sprays into both nostrils daily. 16 g 2  . hydrochlorothiazide (HYDRODIURIL) 25 MG tablet Take 25 mg by mouth daily.    . insulin aspart (NOVOLOG) 100 UNIT/ML injection Inject 15 Units into the skin 3 (three) times daily with meals.     . Insulin Glargine (LANTUS [place]) Inject 40 Units into the skin at bedtime.     .Marland Kitchen  levothyroxine (SYNTHROID, LEVOTHROID) 175 MCG tablet Take 1 tablet (175 mcg total) by mouth daily before breakfast. 30 tablet 0  . lisinopril (PRINIVIL,ZESTRIL) 5 MG tablet Take 5 mg by mouth daily.    . Multiple Vitamin (MULTIVITAMIN) tablet Take 1 tablet by mouth daily.    . mupirocin ointment (BACTROBAN) 2 % Apply to affected area twice daily 30 g 0  . naproxen sodium (ALEVE) 220 MG tablet Take 220 mg by mouth daily as needed (headache).    . rosuvastatin (CRESTOR) 20 MG tablet Take 20 mg by mouth daily.    . sodium chloride (OCEAN) 0.65 % SOLN nasal spray Place 1 spray into both nostrils as needed for congestion.  0    No current facility-administered medications on file prior to visit.     Observations/Objective: Awake, alert, oriented x3 Not in acute distress  Assessment and Plan: 1. Close Exposure to Covid-19 Virus Message sent to [facility] for testing Given she and contact were wearing a mask risk is low to moderate  Discussed warning signs and she is to notify the clinic if she develops this -at the moment she is concerned about her underlying medical conditions coming down with COVID-19 and seems to answer in the affirmative to questions asked. Emphasized COVID-19 precautions She does not need to remain in isolation  2. Post-nasal drip - cetirizine (ZYRTEC) 10 MG tablet; Take 1 tablet (10 mg total) by mouth daily.  Dispense: 30 tablet; Refill: 1   Follow Up Instructions: The previously scheduled appointment with PCP   I discussed the assessment and treatment plan with the patient. The patient was provided an opportunity to ask questions and all were answered. The patient agreed with the plan and demonstrated an understanding of the instructions.   The patient was advised to call back or seek an in-person evaluation if the symptoms worsen or if the condition fails to improve as anticipated.     I provided 12 minutes total of non-face-to-face time during this encounter including median intraservice time, reviewing previous notes, labs, imaging, medications, management and patient verbalized understanding.     Charlott Rakes, MD, FAAFP. [facility] [place], [place]   12/27/2018, 2:54 PM

## 2018-12-27 NOTE — Telephone Encounter (Signed)
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  Symptoms include fatigue, nausea, postnasal drip. Please provide COVID-19 test.   MEDICAID [facility] MEDICAID [place] ACCESS    Primary Visit Coverage Subscriber  ID Name SSN Address [id number] O Kreeger,Lindsey Bowen [id number]    Thank you,Dr. Margarita Rana

## 2018-12-27 NOTE — Telephone Encounter (Signed)
Called pt to schedule covid testing and she reports that she works at [employer]. Instructed pt to call [employer] [facility] [employer] [phone] to report exposure. [facility] will arrange for covid testing. Pt voices understanding

## 2018-12-28 ENCOUNTER — Telehealth: Payer: Self-pay | Admitting: Nurse Practitioner

## 2018-12-28 DIAGNOSIS — R5383 Other fatigue: Secondary | ICD-10-CM | POA: Diagnosis not present

## 2018-12-28 DIAGNOSIS — Z1159 Encounter for screening for other viral diseases: Secondary | ICD-10-CM | POA: Diagnosis not present

## 2018-12-28 NOTE — Telephone Encounter (Signed)
Thanks for the update

## 2018-12-28 NOTE — Telephone Encounter (Signed)
Pt stated that she will get tested at [facility] for [facility].

## 2019-01-08 ENCOUNTER — Encounter: Payer: Self-pay | Admitting: Podiatry

## 2019-01-08 ENCOUNTER — Ambulatory Visit: Payer: Medicaid Other | Admitting: Podiatry

## 2019-01-08 ENCOUNTER — Other Ambulatory Visit: Payer: Self-pay

## 2019-01-08 VITALS — Temp 99.0°F

## 2019-01-08 DIAGNOSIS — B351 Tinea unguium: Secondary | ICD-10-CM

## 2019-01-08 DIAGNOSIS — M722 Plantar fascial fibromatosis: Secondary | ICD-10-CM

## 2019-01-08 MED ORDER — FLUCONAZOLE 150 MG PO TABS: 300.0000 mg | ORAL_TABLET | ORAL | 0 refills | Status: DC

## 2019-01-08 NOTE — Patient Instructions (Signed)
Fluconazole tablets What is this medicine? FLUCONAZOLE (floo KON na zole) is an antifungal medicine. It is used to treat certain kinds of fungal or yeast infections. This medicine may be used for other purposes; ask your health care provider or pharmacist if you have questions. COMMON BRAND NAME(S): Diflucan What should I tell my health care provider before I take this medicine? They need to know if you have any of these conditions:  history of irregular heart beat  kidney disease  an unusual or allergic reaction to fluconazole, other azole antifungals, medicines, foods, dyes, or preservatives  pregnant or trying to get pregnant  breast-feeding How should I use this medicine? Take this medicine by mouth. Follow the directions on the prescription label. Do not take your medicine more often than directed. Talk to your pediatrician regarding the use of this medicine in children. Special care may be needed. This medicine has been used in children as young as 6 months of age. Overdosage: If you think you have taken too much of this medicine contact a poison control center or emergency room at once. NOTE: This medicine is only for you. Do not share this medicine with others. What if I miss a dose? If you miss a dose, take it as soon as you can. If it is almost time for your next dose, take only that dose. Do not take double or extra doses. What may interact with this medicine? Do not take this medicine with any of the following medications:  astemizole  certain medicines for irregular heart beat like dronedarone, quinidine  cisapride  erythromycin  lomitapide  other medicines that prolong the QT interval (cause an abnormal heart rhythm)  pimozide  terfenadine  thioridazine  tolvaptan This medicine may also interact with the following medications:  antiviral medicines for HIV or AIDS  birth control pills  certain antibiotics like rifabutin, rifampin  certain medicines  for blood pressure like amlodipine, isradipine, felodipine, hydrochlorothiazide, losartan, nifedipine  certain medicines for cancer like cyclophosphamide, vinblastine, vincristine  certain medicines for cholesterol like atorvastatin, lovastatin, fluvastatin, simvastatin  certain medicines for depression, anxiety, or psychotic disturbances like amitriptyline, midazolam, nortriptyline, triazolam  certain medicines for diabetes like glipizide, glyburide, tolbutamide  certain medicines for pain like alfentanil, fentanyl, methadone  certain medicines for seizures like carbamazepine, phenytoin  certain medicines that treat or prevent blood clots like warfarin  dofetilide  halofantrine  medicines that lower your chance of fighting infection like cyclosporine, prednisone, tacrolimus  NSAIDS, medicines for pain and inflammation, like celecoxib, diclofenac, flurbiprofen, ibuprofen, meloxicam, naproxen  other medicines for fungal infections  sirolimus  theophylline  tofacitinib  ziprasidone This list may not describe all possible interactions. Give your health care provider a list of all the medicines, herbs, non-prescription drugs, or dietary supplements you use. Also tell them if you smoke, drink alcohol, or use illegal drugs. Some items may interact with your medicine. What should I watch for while using this medicine? Visit your doctor or health care professional for regular checkups. If you are taking this medicine for a long time you may need blood work. Tell your doctor if your symptoms do not improve. Some fungal infections need many weeks or months of treatment to cure. Alcohol can increase possible damage to your liver. Avoid alcoholic drinks. If you have a vaginal infection, do not have sex until you have finished your treatment. You can wear a sanitary napkin. Do not use tampons. Wear freshly washed cotton, not synthetic, panties. What side effects may   I notice from receiving  this medicine? Side effects that you should report to your doctor or health care professional as soon as possible:  allergic reactions like skin rash or itching, hives, swelling of the lips, mouth, tongue, or throat  dark urine  feeling dizzy or faint  irregular heartbeat or chest pain  redness, blistering, peeling or loosening of the skin, including inside the mouth  trouble breathing  unusual bruising or bleeding  vomiting  yellowing of the eyes or skin Side effects that usually do not require medical attention (report to your doctor or health care professional if they continue or are bothersome):  changes in how food tastes  diarrhea  headache  stomach upset or nausea This list may not describe all possible side effects. Call your doctor for medical advice about side effects. You may report side effects to FDA at 1-800-FDA-1088. Where should I keep my medicine? Keep out of the reach of children. Store at room temperature below 30 degrees C (86 degrees F). Throw away any medicine after the expiration date. NOTE: This sheet is a summary. It may not cover all possible information. If you have questions about this medicine, talk to your doctor, pharmacist, or health care provider.  2020 Elsevier/Gold Standard (2018-06-05 11:58:26)  

## 2019-01-15 NOTE — Progress Notes (Signed)
Subjective: 48 year old female presents the office today for concerns of bilateral heel pain left side worse than the right.  She states that she still having discomfort in the left heel mostly in the morning she first gets up after being on her feet all day.  She denies any recent injury or trauma.  Her A1c has not been rechecked but she states that it has been running in the mid 100s when she checks it at home.  He still doing stretching, icing daily as well.  She also presents for evaluation of nail fungus results. Denies any systemic complaints such as fevers, chills, nausea, vomiting. No acute changes since last appointment, and no other complaints at this time.   Objective: AAO x3, NAD DP/PT pulses palpable bilaterally, CRT less than 3 seconds There is still tenderness palpation of the plantar medial tubercle of the calcaneus at insertion of plantar fascia the left side worse than the right.  No pain with lateral compression of calcaneus.  No heel cords or insertion of Achilles tendon.  Backslash appears to be intact.  Negative Tinel sign.  All the nails are unchanged.  No open lesions or pre-ulcerative lesions.  No pain with calf compression, swelling, warmth, erythema  Assessment: Bilateral plantar fasciitis left side worse than right; onychomycosis  Plan: -All treatment options discussed with the patient including all alternatives, risks, complications.  -Steroid injection performed today.  See procedure note below.  Continue stretching, icing daily.  Continue with supportive shoes and orthotics. -Regards the nail culture results we discussed options.  Prescribed fluconazole.  Monitoring side effects. -Patient encouraged to call the office with any questions, concerns, change in symptoms.   Procedure: Injection Tendon/Ligament Discussed alternatives, risks, complications and verbal consent was obtained.  Location: LEFT plantar fascia at the glabrous junction; medial approach. Skin Prep:  Alcohol  Injectate: 0.5cc 0.5% marcaine plain, 0.5 cc 2% lidocaine plain and, 1 cc kenalog 10. Disposition: Patient tolerated procedure well. Injection site dressed with a band-aid.  Post-injection care was discussed and return precautions discussed.   Trula Slade DPM

## 2019-01-19 ENCOUNTER — Other Ambulatory Visit: Payer: Self-pay | Admitting: Family Medicine

## 2019-01-19 DIAGNOSIS — R0982 Postnasal drip: Secondary | ICD-10-CM

## 2019-01-23 DIAGNOSIS — H40011 Open angle with borderline findings, low risk, right eye: Secondary | ICD-10-CM | POA: Diagnosis not present

## 2019-01-31 DIAGNOSIS — E039 Hypothyroidism, unspecified: Secondary | ICD-10-CM | POA: Diagnosis not present

## 2019-01-31 DIAGNOSIS — E1165 Type 2 diabetes mellitus with hyperglycemia: Secondary | ICD-10-CM | POA: Diagnosis not present

## 2019-01-31 DIAGNOSIS — E1169 Type 2 diabetes mellitus with other specified complication: Secondary | ICD-10-CM | POA: Diagnosis not present

## 2019-01-31 DIAGNOSIS — E1129 Type 2 diabetes mellitus with other diabetic kidney complication: Secondary | ICD-10-CM | POA: Diagnosis not present

## 2019-01-31 DIAGNOSIS — R809 Proteinuria, unspecified: Secondary | ICD-10-CM | POA: Diagnosis not present

## 2019-01-31 DIAGNOSIS — E785 Hyperlipidemia, unspecified: Secondary | ICD-10-CM | POA: Diagnosis not present

## 2019-01-31 DIAGNOSIS — Z794 Long term (current) use of insulin: Secondary | ICD-10-CM | POA: Diagnosis not present

## 2019-02-01 DIAGNOSIS — E1165 Type 2 diabetes mellitus with hyperglycemia: Secondary | ICD-10-CM | POA: Diagnosis not present

## 2019-02-02 ENCOUNTER — Other Ambulatory Visit: Payer: Self-pay | Admitting: Family Medicine

## 2019-02-02 DIAGNOSIS — R0982 Postnasal drip: Secondary | ICD-10-CM

## 2019-02-05 ENCOUNTER — Ambulatory Visit: Payer: Medicaid Other | Admitting: Podiatry

## 2019-02-16 ENCOUNTER — Encounter: Payer: Self-pay | Admitting: Podiatry

## 2019-02-16 ENCOUNTER — Ambulatory Visit (INDEPENDENT_AMBULATORY_CARE_PROVIDER_SITE_OTHER): Payer: Medicaid Other | Admitting: Podiatry

## 2019-02-16 ENCOUNTER — Other Ambulatory Visit: Payer: Self-pay

## 2019-02-16 DIAGNOSIS — M722 Plantar fascial fibromatosis: Secondary | ICD-10-CM | POA: Insufficient documentation

## 2019-02-16 DIAGNOSIS — B351 Tinea unguium: Secondary | ICD-10-CM

## 2019-02-16 MED ORDER — FLUCONAZOLE 150 MG PO TABS: 150.0000 mg | ORAL_TABLET | ORAL | 0 refills | Status: DC

## 2019-02-16 NOTE — Progress Notes (Signed)
Subjective: 48 year old female presents the office today for evaluation of left foot plantar fasciitis as well as for onychomycosis.  Injection was helpful and she is requesting another injection today.  She states that the brace is been very helpful but she is not aware that she still gets discomfort.  She has tried shoe inserts.  Also she does the Diflucan she thinks her nails are getting better.  She tolerated well any side effects. Denies any systemic complaints such as fevers, chills, nausea, vomiting. No acute changes since last appointment, and no other complaints at this time.   Objective: AAO x3, NAD DP/PT pulses palpable bilaterally, CRT less than 3 seconds There is still tenderness palpation mostly on plantar medial tubercle of the calcaneus at insertion of plantar fashion the left side.  Plantar fascia appears to be intact.  No edema, erythema.  Overall nails are somewhat improved.  There is no pain the nails there is no redness or drainage.  Some mild clearing on the proximal aspect. No open lesions or pre-ulcerative lesions.  No pain with calf compression, swelling, warmth, erythema  Assessment: Plantar fasciitis, onychomycosis  Plan: -All treatment options discussed with the patient including all alternatives, risks, complications.  -Second steroid injection performed today to the left foot.  See procedure note below.  Continue stretching, icing daily.  Continue night splint.  She has not been stretching and use of the night splint as often.  She still has a tight Achilles tendon. -We will continue Diflucan.  Refill today.  Continue to monitor for side effects. -Patient encouraged to call the office with any questions, concerns, change in symptoms.   Trula Slade DPM

## 2019-02-19 DIAGNOSIS — J45909 Unspecified asthma, uncomplicated: Secondary | ICD-10-CM | POA: Diagnosis not present

## 2019-02-19 DIAGNOSIS — R5383 Other fatigue: Secondary | ICD-10-CM | POA: Diagnosis not present

## 2019-02-19 DIAGNOSIS — Z7182 Exercise counseling: Secondary | ICD-10-CM | POA: Diagnosis not present

## 2019-02-19 DIAGNOSIS — G4733 Obstructive sleep apnea (adult) (pediatric): Secondary | ICD-10-CM | POA: Diagnosis not present

## 2019-02-19 DIAGNOSIS — E669 Obesity, unspecified: Secondary | ICD-10-CM | POA: Diagnosis not present

## 2019-02-19 DIAGNOSIS — E1065 Type 1 diabetes mellitus with hyperglycemia: Secondary | ICD-10-CM | POA: Diagnosis not present

## 2019-02-19 DIAGNOSIS — Z6836 Body mass index (BMI) 36.0-36.9, adult: Secondary | ICD-10-CM | POA: Diagnosis not present

## 2019-02-19 DIAGNOSIS — E039 Hypothyroidism, unspecified: Secondary | ICD-10-CM | POA: Diagnosis not present

## 2019-02-19 DIAGNOSIS — Z713 Dietary counseling and surveillance: Secondary | ICD-10-CM | POA: Diagnosis not present

## 2019-02-23 DIAGNOSIS — E669 Obesity, unspecified: Secondary | ICD-10-CM | POA: Diagnosis not present

## 2019-02-23 DIAGNOSIS — R5383 Other fatigue: Secondary | ICD-10-CM | POA: Diagnosis not present

## 2019-02-23 DIAGNOSIS — Z6836 Body mass index (BMI) 36.0-36.9, adult: Secondary | ICD-10-CM | POA: Diagnosis not present

## 2019-03-01 DIAGNOSIS — Z6836 Body mass index (BMI) 36.0-36.9, adult: Secondary | ICD-10-CM | POA: Diagnosis not present

## 2019-03-01 DIAGNOSIS — M545 Low back pain: Secondary | ICD-10-CM | POA: Diagnosis not present

## 2019-03-01 DIAGNOSIS — E669 Obesity, unspecified: Secondary | ICD-10-CM | POA: Diagnosis not present

## 2019-03-01 DIAGNOSIS — Z13228 Encounter for screening for other metabolic disorders: Secondary | ICD-10-CM | POA: Diagnosis not present

## 2019-03-16 ENCOUNTER — Ambulatory Visit (INDEPENDENT_AMBULATORY_CARE_PROVIDER_SITE_OTHER): Payer: Medicaid Other | Admitting: Podiatry

## 2019-03-16 ENCOUNTER — Other Ambulatory Visit: Payer: Self-pay

## 2019-03-16 ENCOUNTER — Encounter: Payer: Self-pay | Admitting: Podiatry

## 2019-03-16 DIAGNOSIS — M722 Plantar fascial fibromatosis: Secondary | ICD-10-CM

## 2019-03-16 DIAGNOSIS — B351 Tinea unguium: Secondary | ICD-10-CM

## 2019-03-16 MED ORDER — FLUCONAZOLE 150 MG PO TABS: 150.0000 mg | ORAL_TABLET | ORAL | 0 refills | Status: DC

## 2019-03-20 DIAGNOSIS — H40052 Ocular hypertension, left eye: Secondary | ICD-10-CM | POA: Diagnosis not present

## 2019-03-20 DIAGNOSIS — H40013 Open angle with borderline findings, low risk, bilateral: Secondary | ICD-10-CM | POA: Diagnosis not present

## 2019-03-28 NOTE — Progress Notes (Signed)
Subjective: 48 year old female presents the office today for evaluation of plantar fasciitis.  She says overall she is doing better.  She get some occasional soreness but overall much improved.  The brace is also been helpful.  She is also asked for the nails be trimmed today.  She has been on the fluconazole the nail fungus and this is been helpful.  She has 3 more pills left. Denies any systemic complaints such as fevers, chills, nausea, vomiting. No acute changes since last appointment, and no other complaints at this time.   Objective: AAO x3, NAD DP/PT pulses palpable bilaterally, CRT less than 3 seconds There is minimal tenderness palpation of the plantar medial tubercle of the calcaneus insertion of plantar fashion the left side.  Overall much improved.  No edema, erythema.  No pain with lateral compression of calcaneus.  No pain with Achilles tendon. Overall nails are improving and the color is much improved.  Nails are elongated mildly dystrophic and still discolored with yellow-brown discoloration. No pain with calf compression, swelling, warmth, erythema  Assessment: Resolving left foot pain, plan fasciitis with onychomycosis, currently on fluconazole.  Plan: -All treatment options discussed with the patient including all alternatives, risks, complications.  -At this point will refer to physical therapy because she is describing more tightness as opposed to pain.  Continue supportive shoes as well. -As a courtesy debride the nails x10 without any complications or bleeding.  Ready continue fluconazole. -Patient encouraged to call the office with any questions, concerns, change in symptoms.   Return in about 3 months (around 06/15/2019).  Trula Slade DPM

## 2019-03-29 ENCOUNTER — Telehealth: Payer: Self-pay | Admitting: *Deleted

## 2019-03-29 DIAGNOSIS — M722 Plantar fascial fibromatosis: Secondary | ICD-10-CM

## 2019-03-29 NOTE — Telephone Encounter (Signed)
Faxed orders, demographics to [facility].

## 2019-03-29 NOTE — Telephone Encounter (Signed)
-----   Message from Trula Slade, DPM sent at 03/28/2019  3:33 PM EDT ----- Can you please put in a referral for [facility]? Sorry if this is a duplicate. Thanks.

## 2019-04-02 ENCOUNTER — Telehealth: Payer: Self-pay | Admitting: Physical Therapy

## 2019-04-02 NOTE — Telephone Encounter (Signed)
Opened in error

## 2019-04-06 ENCOUNTER — Other Ambulatory Visit: Payer: Self-pay

## 2019-04-06 ENCOUNTER — Ambulatory Visit: Payer: Medicaid Other | Attending: Podiatry | Admitting: Physical Therapy

## 2019-04-06 ENCOUNTER — Encounter: Payer: Self-pay | Admitting: Physical Therapy

## 2019-04-06 DIAGNOSIS — M79672 Pain in left foot: Secondary | ICD-10-CM | POA: Insufficient documentation

## 2019-04-06 DIAGNOSIS — R2689 Other abnormalities of gait and mobility: Secondary | ICD-10-CM | POA: Diagnosis not present

## 2019-04-06 DIAGNOSIS — M79671 Pain in right foot: Secondary | ICD-10-CM | POA: Diagnosis not present

## 2019-04-06 NOTE — Therapy (Signed)
[place], [place], [address] Phone: [phone]   Fax:  [phone]  Physical Therapy Evaluation  Patient Details  Name: Lindsey Bowen MRN: [id number] Date of Birth: 1970/09/08 Referring Provider (PT): Dr Annice Needy    Encounter Date: 04/06/2019  PT End of Session - 04/06/19 0811    Visit Number  1    Number of Visits  12    Date for PT Re-Evaluation  05/18/19       Past Medical History:  Diagnosis Date  . Anemia   . 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 Diabetes mellitus without complication ([facility])   . Edema   . Lumbar pain   . Onychodystrophy   . Onychomycosis   . Paresthesia 05/17/2017  . Sleep apnea   . Thyroid disease   . TIA (transient ischemic attack) 05/26/2017  . Type 2 diabetes mellitus with microalbuminuria ([facility]) 05/27/2017    Past Surgical History:  Procedure Laterality Date  . CHOLECYSTECTOMY    . DILATION AND CURETTAGE OF UTERUS    . HERNIA REPAIR      There were no vitals filed for this visit.   Subjective Assessment - 04/06/19 0817    Subjective  Patient had an incidious onset of bilateral foot pain begining 6 months ago L>R. She has increased pain when she stands and walks and when she sits for too long. She had increased pain while working but recently stopped working. She has inserts in her tennis shoes which helps.    Limitations  Standing;Writing;Walking    How long can you stand comfortably?  depends    How long can you walk comfortably?  depends    Diagnostic tests  Nothing    Patient Stated Goals  to have less pain    Currently in Pain?  Yes    Pain Score  7     Pain Location  Foot    Pain Orientation  Left    Pain Descriptors / Indicators  Aching    Pain Type  Chronic pain    Pain Onset  More than a month ago    Pain Frequency  Constant    Aggravating Factors   standing and walking    Pain Relieving Factors   rest    Effect of Pain on Daily Activities  difficulty perfroming ADL's    Multiple Pain Sites  Yes         [facility] PT Assessment - 04/06/19 0001      Assessment   Medical Diagnosis  Bilateral Plantar facitis     Referring Provider (PT)  Dr Annice Needy     Onset Date/Surgical Date  --   6 months prior    Hand Dominance  Right    Next MD Visit  Sometime next month     Prior Therapy  For lower back and neck       Precautions   Precautions  None      Restrictions   Weight Bearing Restrictions  No      Balance Screen   Has the patient fallen in the past 6 months  No    Has the patient had a decrease in activity level because of a fear of falling?   No    Is the patient reluctant to leave their home because of a fear of falling?   No      Home Environment   Additional Comments  2 flights of  steps into the house which cause foot pain       Prior Function   Level of Independence  Independent    Vocation  Unemployed    Leisure  Nothing at this time       Cognition   Overall Cognitive Status  Within Functional Limits for tasks assessed    Attention  Focused    Memory  Appears intact    Awareness  Appears intact    Problem Solving  Appears intact    Executive Function  --      Observation/Other Assessments   Observations  wearing crocs ; bilateral calcaneal valgus R > L; Bilateral pronation;     Focus on Therapeutic Outcomes (FOTO)   Mediciad       Sensation   Light Touch  Appears Intact      Coordination   Gross Motor Movements are Fluid and Coordinated  Yes    Fine Motor Movements are Fluid and Coordinated  Yes      Posture/Postural Control   Posture Comments  rounded shoulders       ROM / Strength   AROM / PROM / Strength  PROM;AROM;Strength      AROM   AROM Assessment Site  Ankle    Right/Left Ankle  Right;Left    Right Ankle Dorsiflexion  0    Right Ankle Plantar Flexion  30    Right Ankle Inversion  30    Right Ankle Eversion  10    Left Ankle  Dorsiflexion  -5    Left Ankle Plantar Flexion  30    Left Ankle Inversion  30    Left Ankle Eversion  8      PROM   PROM Assessment Site  Ankle    Right/Left Ankle  Right;Left    Right Ankle Dorsiflexion  2    Right Ankle Plantar Flexion  33    Left Ankle Dorsiflexion  -2    Left Ankle Plantar Flexion  35    Left Ankle Inversion  33    Left Ankle Eversion  12      Strength   Strength Assessment Site  Ankle    Right/Left Ankle  Right;Left    Right Ankle Dorsiflexion  4/5    Right Ankle Plantar Flexion  4/5    Right Ankle Inversion  4+/5    Right Ankle Eversion  4+/5    Left Ankle Dorsiflexion  3+/5    Left Ankle Plantar Flexion  4/5    Left Ankle Inversion  4/5    Left Ankle Eversion  4/5      Ambulation/Gait   Gait Comments  lateral movement with gait; decreased heel to toe R> L ; wearing crocs                 Objective measurements completed on examination: See above findings.      [facility] Adult PT Treatment/Exercise - 04/06/19 0001      Manual Therapy   Manual therapy comments  TFM to plantar facia; trigger point release to calf       Ankle Exercises: Stretches   Other Stretch  TFM to plantar facia       Ankle Exercises: Supine   T-Band  ankle DF yellow x10; eversion yellow x10; give the opposite next visit               PT Short Term Goals - 04/06/19 0938      PT SHORT TERM GOAL #1  Title  Patient will increase active bilateral DF by 1o degrees    Baseline  right 2 degrees left -5    Time  4    Period  Weeks    Status  New    Target Date  05/04/19      PT SHORT TERM GOAL #2   Title  Patient will increase gross bilateral ankle strength to 5/5    Baseline  left PF 3+/5 left 4/5 bilateral DF 4/5    Time  3    Period  Weeks    Status  New    Target Date  04/27/19      PT SHORT TERM GOAL #3   Title  Patient will be independent with basic stretching, strenghtening and self soft tissue mobilization program    Baseline  no program     Time  3    Period  Weeks    Status  New    Target Date  04/27/19        PT Long Term Goals - 04/06/19 0946      PT LONG TERM GOAL #1   Title  Patient will stand for 1 hour without increased pain in order to perfrom ADL's    Baseline  can not stand more then 10-15 minutes without increased pain.    Time  6    Period  Weeks    Status  New    Target Date  05/18/19      PT LONG TERM GOAL #2   Title  Patient will go up and down 12 steps without pain in order to get into her house.    Baseline  pain going up and down steps    Time  6    Period  Weeks    Status  New    Target Date  05/18/19             Plan - 04/06/19 0951    Clinical Impression Statement  Patient is a 89 year olf female who presents with bilateral plantar facitis L>R . She has decreased DF bilateral L>R. She has several trigger points therought her calfs. She has bilateral flat foot and pronation in standing. She has inserts which have heled. She was encouraged to wear her shoes with inserts when she is going to be walking. She would benefit from skilled therapy to improve her ankle mobility and decrease pain in her plantar facia. She was seen for a low complexity eval.    Personal Factors and Comorbidities  Comorbidity 1    Comorbidities  obesity, diabetes, low back pain,    Examination-Activity Limitations  Locomotion Level;Stairs;Lift;Squat    Examination-Participation Restrictions  Community Activity;Meal Prep;Yard Work;Laundry;Shop    Stability/Clinical Decision Making  Stable/Uncomplicated    Clinical Decision Making  Low    Rehab Potential  Good    PT Frequency  2x / week    PT Duration  6 weeks    PT Next Visit Plan  consider arch taping, manual therapy to foot; IASTYM to calf; light stretching to the calf if tolerated. give inversion strengthening and PF ;    PT Home Exercise Plan  anke DF strengthening; self trigger ponit release to calf ; TFM to plantar facia;    Consulted and Agree with Plan of  Care  Patient       Patient will benefit from skilled therapeutic intervention in order to improve the following deficits and impairments:  Abnormal gait, Decreased mobility, Decreased activity tolerance,  Decreased strength, Pain, Obesity, Decreased range of motion, Difficulty walking  Visit Diagnosis: Pain in left foot  Pain in right foot  Other abnormalities of gait and mobility     Problem List Patient Active Problem List   Diagnosis Date Noted  . Plantar fasciitis 02/16/2019  . History of TIA (transient ischemic attack) 06/29/2017  . Slurred speech 06/28/2017  . 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 Essential hypertension 05/27/2017  . Hyperlipidemia LDL goal <70 05/27/2017  . Hypothyroidism 05/27/2017  . TIA (transient ischemic attack) 05/26/2017  . Paresthesia 05/17/2017  . Iron deficiency 04/19/2016  . Sleep apnea 04/19/2016  . Chronic bilateral low back pain with left-sided sciatica 02/13/2016  . Onychodystrophy 08/26/2015  . Onychomycosis 08/26/2015    Dessie Coma PT DPT  04/06/2019, 12:38 PM  [facility] [address] [place], [place], [address] Phone: [phone]   Fax:  [phone]  Name: Lindsey Bowen MRN: [id number] Date of Birth: January 21, 1971

## 2019-04-09 ENCOUNTER — Ambulatory Visit: Payer: Medicaid Other | Admitting: Physical Therapy

## 2019-04-13 DIAGNOSIS — E1165 Type 2 diabetes mellitus with hyperglycemia: Secondary | ICD-10-CM | POA: Diagnosis not present

## 2019-04-16 ENCOUNTER — Other Ambulatory Visit: Payer: Self-pay

## 2019-04-16 ENCOUNTER — Encounter: Payer: Self-pay | Admitting: Nurse Practitioner

## 2019-04-16 ENCOUNTER — Ambulatory Visit: Payer: Medicaid Other | Attending: Nurse Practitioner | Admitting: Nurse Practitioner

## 2019-04-16 VITALS — BP 113/74 | HR 78 | Temp 98.5°F | Ht 64.0 in | Wt 224.0 lb

## 2019-04-16 DIAGNOSIS — E1165 Type 2 diabetes mellitus with hyperglycemia: Secondary | ICD-10-CM | POA: Diagnosis not present

## 2019-04-16 DIAGNOSIS — E785 Hyperlipidemia, unspecified: Secondary | ICD-10-CM | POA: Diagnosis not present

## 2019-04-16 DIAGNOSIS — E782 Mixed hyperlipidemia: Secondary | ICD-10-CM | POA: Diagnosis not present

## 2019-04-16 DIAGNOSIS — Z8669 Personal history of other diseases of the nervous system and sense organs: Secondary | ICD-10-CM

## 2019-04-16 DIAGNOSIS — Z794 Long term (current) use of insulin: Secondary | ICD-10-CM | POA: Insufficient documentation

## 2019-04-16 DIAGNOSIS — E669 Obesity, unspecified: Secondary | ICD-10-CM

## 2019-04-16 DIAGNOSIS — R0602 Shortness of breath: Secondary | ICD-10-CM | POA: Insufficient documentation

## 2019-04-16 DIAGNOSIS — E079 Disorder of thyroid, unspecified: Secondary | ICD-10-CM | POA: Insufficient documentation

## 2019-04-16 DIAGNOSIS — Z8673 Personal history of transient ischemic attack (TIA), and cerebral infarction without residual deficits: Secondary | ICD-10-CM | POA: Diagnosis not present

## 2019-04-16 DIAGNOSIS — Z8249 Family history of ischemic heart disease and other diseases of the circulatory system: Secondary | ICD-10-CM | POA: Diagnosis not present

## 2019-04-16 DIAGNOSIS — Z7982 Long term (current) use of aspirin: Secondary | ICD-10-CM | POA: Insufficient documentation

## 2019-04-16 DIAGNOSIS — Z79899 Other long term (current) drug therapy: Secondary | ICD-10-CM | POA: Insufficient documentation

## 2019-04-16 MED ORDER — ROSUVASTATIN CALCIUM 20 MG PO TABS: 20.0000 mg | ORAL_TABLET | Freq: Every day | ORAL | 2 refills | Status: DC

## 2019-04-16 MED ORDER — ATORVASTATIN CALCIUM 80 MG PO TABS: 80.0000 mg | ORAL_TABLET | Freq: Every day | ORAL | 0 refills | Status: DC

## 2019-04-16 NOTE — Progress Notes (Signed)
Assessment & Plan:  There are no diagnoses linked to this encounter.  Patient has been counseled on age-appropriate routine health concerns for screening and prevention. These are reviewed and up-to-date. Referrals have been placed accordingly. Immunizations are up-to-date or declined.    Subjective:   Chief Complaint  Patient presents with  . Follow-up    Pt. is here for a follow up    HPI Lindsey Bowen 48 y.o. female presents to office today for follow up. has a past medical history of Anemia, Diabetes mellitus type 2 with neurological manifestations ([place]) (05/27/2017), Diabetes mellitus without complication ([place]), Edema, Lumbar pain, Onychodystrophy, Onychomycosis, Paresthesia (05/17/2017), Sleep apnea, Thyroid disease, TIA (transient ischemic attack) (05/26/2017), and Type 2 diabetes mellitus with microalbuminuria ([place]) (05/27/2017).   Her endocrinologist is managing her Thyroid disease and diabetes.  PER Dr. Hartford Poli  01-31-2019  1. Your diabetes has remained poorly controlled. 2. You need to make intensive lifestyle improvements if you want to have any chance for getting your diabetes better and lowering your risk for complications. 3. Referral placed to the [facility] clinic in [place] to provide support in making dietary and lifestyle changes. 4. Continue using Novolog with the Omnipod DASH insulin pump system. 5. Continue using Dexcom G6 continuous glucose monitor. 6. Enter blood sugars into your pump at least 6-8 times per day and give correction doses when indicated. 7. Enter all carbohydrate counts into your pump and give calculated insulin doses. 8. Check thyroid levels today. 9. Continue Levothyroxine 131mcg #1 tablet daily if levels are now normal. 10. Follow-up in 3 months.   OSA She endorses excessive daytime sleepiness. States was diagnosed with "mild" OSA years ago but did not require a CPAP at that time. She does  snore and endorses shortness of breath with minimal exertion.    Hyperlipidemia Patient presents for follow up to hyperlipidemia.  She is medication compliant taking rosuvastatin 20 mg daily. She is not diet compliant and denies statin intolerance including myalgias. She admits to poor dietary compliance. Not exercising. Rarely carb counting and still drinking sugary drinks. I instructed her that most of her fatigue could be coming from OSA and her diet which consists of too many carbs.  Lab Results  Component Value Date   CHOL 111 05/27/2017   Lab Results  Component Value Date   HDL 36 (L) 05/27/2017   Lab Results  Component Value Date   LDLCALC 61 05/27/2017   Lab Results  Component Value Date   TRIG 72 05/27/2017   Lab Results  Component Value Date   CHOLHDL 3.1 05/27/2017     Review of Systems  Constitutional: Positive for malaise/fatigue. Negative for fever and weight loss.  HENT: Negative.  Negative for nosebleeds.   Eyes: Negative.  Negative for blurred vision, double vision and photophobia.  Respiratory: Positive for shortness of breath. Negative for cough.   Cardiovascular: Negative.  Negative for chest pain, palpitations and leg swelling.  Gastrointestinal: Negative.  Negative for heartburn, nausea and vomiting.  Musculoskeletal: Negative.  Negative for myalgias.  Neurological: Negative.  Negative for dizziness, focal weakness, seizures and headaches.  Psychiatric/Behavioral: Negative.  Negative for suicidal ideas.    Past Medical History:  Diagnosis Date  . Anemia   . Diabetes mellitus type 2 with neurological manifestations ([place]) 05/27/2017  . Diabetes mellitus without complication ([place])   . Edema   . Lumbar pain   . Onychodystrophy   . Onychomycosis   . Paresthesia 05/17/2017  .  Sleep apnea   . Thyroid disease   . TIA (transient ischemic attack) 05/26/2017  . 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    Past Surgical History:   Procedure Laterality Date  . CHOLECYSTECTOMY    . DILATION AND CURETTAGE OF UTERUS    . HERNIA REPAIR      Family History  Problem Relation Age of Onset  . Diabetes Mother   . Hypertension Mother   . Heart attack Maternal Grandmother   . Stroke Maternal Grandmother   . Diabetes Maternal Grandfather   . Cancer Maternal Grandfather     Social History Reviewed with no changes to be made today.   Outpatient Medications Prior to Visit  Medication Sig Dispense Refill  . acetaminophen (TYLENOL) 650 MG CR tablet Take 650 mg by mouth every 8 (eight) hours as needed for pain.    Marland Kitchen. albuterol (VENTOLIN HFA) 108 (90 Base) MCG/ACT inhaler Inhale into the lungs.    . cetirizine (ZYRTEC) 10 MG tablet TAKE 1 TABLET BY MOUTH EVERY DAY 90 tablet 0  . cyclobenzaprine (FLEXERIL) 5 MG tablet Take 5 mg by mouth 3 (three) times daily as needed for muscle spasms.    . diclofenac sodium (VOLTAREN) 1 % GEL Apply 2 g topically 4 (four) times daily. Rub into affected area of foot 2 to 4 times daily 100 g 2  . fluconazole (DIFLUCAN) 150 MG tablet Take 1 tablet (150 mg total) by mouth once a week. 12 tablet 0  . fluticasone (FLONASE) 50 MCG/ACT nasal spray Place 2 sprays into both nostrils daily. 16 g 2  . hydrochlorothiazide (HYDRODIURIL) 25 MG tablet Take 25 mg by mouth daily.    . Insulin Disposable Pump (OMNIPOD 10 PACK) MISC by Does not apply route.    Marland Kitchen. levothyroxine (SYNTHROID, LEVOTHROID) 175 MCG tablet Take 1 tablet (175 mcg total) by mouth daily before breakfast. 30 tablet 0  . lisinopril (PRINIVIL,ZESTRIL) 5 MG tablet Take 5 mg by mouth daily.    . Multiple Vitamin (MULTIVITAMIN) tablet Take 1 tablet by mouth daily.    . mupirocin ointment (BACTROBAN) 2 % Apply to affected area twice daily 30 g 0  . naproxen sodium (ALEVE) 220 MG tablet Take 220 mg by mouth daily as needed (headache).    . rosuvastatin (CRESTOR) 20 MG tablet Take 20 mg by mouth daily.    . sodium chloride (OCEAN) 0.65 % SOLN nasal  spray Place 1 spray into both nostrils as needed for congestion.  0  . aspirin 81 MG chewable tablet Chew 1 tablet (81 mg total) by mouth daily. (Patient not taking: Reported on 04/16/2019) 30 tablet 0  . atorvastatin (LIPITOR) 80 MG tablet Take 1 tablet (80 mg total) by mouth daily at 6 PM. (Patient not taking: Reported on 04/16/2019) 30 tablet 0  . Dulaglutide (TRULICITY) 1.5 MG/0.5ML SOPN Inject 1.5 mg into the skin once a week.     . insulin aspart (NOVOLOG) 100 UNIT/ML injection Inject 15 Units into the skin 3 (three) times daily with meals.     . Insulin Glargine (LANTUS [place]) Inject 40 Units into the skin at bedtime.      No facility-administered medications prior to visit.     Allergies  Allergen Reactions  . Dust Mite Extract Hives and Itching  . Shellfish Allergy Anaphylaxis       Objective:    BP 113/74 (BP Location: Left Arm, Patient Position: Sitting, Cuff Size: Large)   Pulse 78   Temp  98.5 F (36.9 C) (Oral)   Ht 5\' 4"  (1.626 m)   Wt 224 lb (101.6 kg)   SpO2 95%   BMI 38.45 kg/m  Wt Readings from Last 3 Encounters:  04/16/19 224 lb (101.6 kg)  05/26/17 197 lb 5 oz (89.5 kg)  05/17/17 197 lb 8 oz (89.6 kg)    Physical Exam Vitals signs and nursing note reviewed.  Constitutional:      Appearance: She is well-developed.  HENT:     Head: Normocephalic and atraumatic.  Neck:     Musculoskeletal: Normal range of motion.  Cardiovascular:     Rate and Rhythm: Normal rate and regular rhythm.     Heart sounds: Normal heart sounds. No murmur. No friction rub. No gallop.   Pulmonary:     Effort: Pulmonary effort is normal. No tachypnea or respiratory distress.     Breath sounds: Normal breath sounds. No decreased breath sounds, wheezing, rhonchi or rales.  Chest:     Chest wall: No tenderness.  Abdominal:     General: Bowel sounds are normal.     Palpations: Abdomen is soft.  Musculoskeletal: Normal range of motion.  Skin:    General: Skin is warm and dry.   Neurological:     Mental Status: She is alert and oriented to person, place, and time.     Coordination: Coordination normal.  Psychiatric:        Behavior: Behavior normal. Behavior is cooperative.        Thought Content: Thought content normal.        Judgment: Judgment normal.          Patient has been counseled extensively about nutrition and exercise as well as the importance of adherence with medications and regular follow-up. The patient was given clear instructions to go to ER or return to medical center if symptoms don't improve, worsen or new problems develop. The patient verbalized understanding.   Follow-up: No follow-ups on file.   05/19/17, FNP-BC [facility] [place], [place]   04/16/2019, 2:21 PM

## 2019-04-20 ENCOUNTER — Encounter

## 2019-04-25 ENCOUNTER — Ambulatory Visit: Payer: Medicaid Other | Admitting: Physical Therapy

## 2019-04-25 ENCOUNTER — Telehealth: Payer: Self-pay | Admitting: Physical Therapy

## 2019-04-25 NOTE — Telephone Encounter (Signed)
Left message for the patient regarding no-show appointment. Patient advised of her next visit. She was advised to call if she is not going to make it.

## 2019-04-27 ENCOUNTER — Ambulatory Visit: Payer: Medicaid Other | Admitting: Physical Therapy

## 2019-04-30 ENCOUNTER — Encounter: Payer: Self-pay | Admitting: Physical Therapy

## 2019-04-30 ENCOUNTER — Ambulatory Visit: Payer: Medicaid Other | Attending: Podiatry | Admitting: Physical Therapy

## 2019-04-30 ENCOUNTER — Other Ambulatory Visit: Payer: Self-pay

## 2019-04-30 DIAGNOSIS — M79671 Pain in right foot: Secondary | ICD-10-CM | POA: Insufficient documentation

## 2019-04-30 DIAGNOSIS — M5442 Lumbago with sciatica, left side: Secondary | ICD-10-CM | POA: Diagnosis not present

## 2019-04-30 DIAGNOSIS — G8929 Other chronic pain: Secondary | ICD-10-CM

## 2019-04-30 DIAGNOSIS — R2689 Other abnormalities of gait and mobility: Secondary | ICD-10-CM | POA: Diagnosis not present

## 2019-04-30 DIAGNOSIS — M79672 Pain in left foot: Secondary | ICD-10-CM

## 2019-04-30 NOTE — Therapy (Signed)
[facility] [address] [place], [place], [address] Phone: [phone]   Fax:  [phone]  Physical Therapy Treatment  Patient Details  Name: Lindsey Bowen MRN: [id number] Date of Birth: [birth year] Referring Provider (PT): Dr Jillyn LedgerMathew Wagoner    Encounter Date: 04/30/2019  PT End of Session - 04/30/19 2028    Visit Number  2    Number of Visits  12    Date for PT Re-Evaluation  05/18/19    PT Start Time  1102    PT Stop Time  1140    PT Time Calculation (min)  38 min    Activity Tolerance  Patient tolerated treatment well    Behavior During Therapy  [facility] for tasks assessed/performed       Past Medical History:  Diagnosis Date  . Anemia   . 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 Diabetes mellitus without complication ([facility])   . Edema   . Lumbar pain   . Onychodystrophy   . Onychomycosis   . Paresthesia 05/17/2017  . Sleep apnea   . Thyroid disease   . TIA (transient ischemic attack) 05/26/2017  . 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 CHOLECYSTECTOMY    . DILATION AND CURETTAGE OF UTERUS    . HERNIA REPAIR      There were no vitals filed for this visit.  Subjective Assessment - 04/30/19 1021    Subjective  Patient reports her feet are about the same. She has been perfroming self trigger point release which has helped. She is beggining a walking program today.    Limitations  Standing;Writing;Walking    How long can you stand comfortably?  depends    How long can you walk comfortably?  depends    Diagnostic tests  Nothing    Patient Stated Goals  to have less pain    Currently in Pain?  Yes    Pain Score  5     Pain Location  Foot    Pain Orientation  Right;Left    Pain Descriptors / Indicators  Aching    Pain Type  Chronic pain    Pain Onset  More than a month ago    Pain Frequency   Constant    Aggravating Factors   standing and walkin g    Pain Relieving Factors  rest    Effect of Pain on Daily Activities  difficulty perfroming ADL;s    Multiple Pain Sites  --   same pain in the left and right today.                      [facility] Adult PT Treatment/Exercise - 04/30/19 0001      Manual Therapy   Manual Therapy  Soft tissue mobilization;Passive ROM;Joint mobilization    Manual therapy comments  TFM to plantar facia; trigger point release to calf ; IASTYM co calf     Soft tissue mobilization  Anterior drawer glides to improve DF    Passive ROM  into DF       Ankle Exercises: Stretches   Other Stretch  TFM to plantar facia       Ankle Exercises: Supine   T-Band  ankle DF yellow x10; eversion yellow x10; give the opposite next visit             PT Education - 04/30/19 1025  Education Details  reviewed HEP and symptom mangement    Person(s) Educated  Patient    Methods  Explanation;Demonstration;Tactile cues;Verbal cues    Comprehension  Verbalized understanding;Returned demonstration;Verbal cues required;Tactile cues required       PT Short Term Goals - 04/06/19 0938      PT SHORT TERM GOAL #1   Title  Patient will increase active bilateral DF by 1o degrees    Baseline  right 2 degrees left -5    Time  4    Period  Weeks    Status  New    Target Date  05/04/19      PT SHORT TERM GOAL #2   Title  Patient will increase gross bilateral ankle strength to 5/5    Baseline  left PF 3+/5 left 4/5 bilateral DF 4/5    Time  3    Period  Weeks    Status  New    Target Date  04/27/19      PT SHORT TERM GOAL #3   Title  Patient will be independent with basic stretching, strenghtening and self soft tissue mobilization program    Baseline  no program    Time  3    Period  Weeks    Status  New    Target Date  04/27/19        PT Long Term Goals - 04/06/19 0946      PT LONG TERM GOAL #1   Title  Patient will stand for 1 hour without  increased pain in order to perfrom ADL's    Baseline  can not stand more then 10-15 minutes without increased pain.    Time  6    Period  Weeks    Status  New    Target Date  05/18/19      PT LONG TERM GOAL #2   Title  Patient will go up and down 12 steps without pain in order to get into her house.    Baseline  pain going up and down steps    Time  6    Period  Weeks    Status  New    Target Date  05/18/19            Plan - 04/30/19 2029    Clinical Impression Statement  therapy focused on manual therapy to imporve calf restrictions without increasing plantar irritation. Decreased tenderness to palpation in the calf and plantar facia. Therapy advanced her theraband to red.    Personal Factors and Comorbidities  Comorbidity 1    Comorbidities  obesity, diabetes, low back pain,    Examination-Activity Limitations  Locomotion Level;Stairs;Lift;Squat    Examination-Participation Restrictions  Community Activity;Meal Prep;Yard Work;Laundry;Shop    Stability/Clinical Decision Making  Stable/Uncomplicated    Clinical Decision Making  Low    Rehab Potential  Good    PT Frequency  2x / week    PT Duration  6 weeks    PT Next Visit Plan  consider arch taping, manual therapy to foot; IASTYM to calf; light stretching to the calf if tolerated. give inversion strengthening and PF ;    PT Home Exercise Plan  anke DF strengthening; self trigger ponit release to calf ; TFM to plantar facia;    Consulted and Agree with Plan of Care  Patient       Patient will benefit from skilled therapeutic intervention in order to improve the following deficits and impairments:  Abnormal gait, Decreased mobility, Decreased activity  tolerance, Decreased strength, Pain, Obesity, Decreased range of motion, Difficulty walking  Visit Diagnosis: Pain in left foot  Pain in right foot  Other abnormalities of gait and mobility  Chronic left-sided low back pain with left-sided sciatica     Problem  List Patient Active Problem List   Diagnosis Date Noted  . Plantar fasciitis 02/16/2019  . History of TIA (transient ischemic attack) 06/29/2017  . Slurred speech 06/28/2017  . 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 Essential hypertension 05/27/2017  . Hyperlipidemia LDL goal <70 05/27/2017  . Hypothyroidism 05/27/2017  . TIA (transient ischemic attack) 05/26/2017  . Paresthesia 05/17/2017  . Iron deficiency 04/19/2016  . Sleep apnea 04/19/2016  . Chronic bilateral low back pain with left-sided sciatica 02/13/2016  . Onychodystrophy 08/26/2015  . Onychomycosis 08/26/2015    Dessie Coma PT DPT  04/30/2019, 8:34 PM  [facility] [address] [place], [place], [address] Phone: [phone]   Fax:  [phone]  Name: Lindsey Bowen MRN: [id number] Date of Birth: 05-27-1971

## 2019-05-02 ENCOUNTER — Ambulatory Visit: Payer: Medicaid Other | Admitting: Physical Therapy

## 2019-05-02 ENCOUNTER — Other Ambulatory Visit (HOSPITAL_COMMUNITY)
Admission: RE | Admit: 2019-05-02 | Discharge: 2019-05-02 | Disposition: A | Discharge status: Home / Self Care | Payer: Medicaid Other | Source: Ambulatory Visit | Attending: Internal Medicine | Admitting: Internal Medicine

## 2019-05-02 DIAGNOSIS — Z01812 Encounter for preprocedural laboratory examination: Secondary | ICD-10-CM | POA: Diagnosis not present

## 2019-05-02 DIAGNOSIS — Z20828 Contact with and (suspected) exposure to other viral communicable diseases: Secondary | ICD-10-CM | POA: Insufficient documentation

## 2019-05-02 LAB — SARS CORONAVIRUS 2 (TAT 6-24 HRS): SARS Coronavirus 2: NEGATIVE

## 2019-05-03 ENCOUNTER — Ambulatory Visit (HOSPITAL_BASED_OUTPATIENT_CLINIC_OR_DEPARTMENT_OTHER): Payer: Medicaid Other | Attending: Nurse Practitioner | Admitting: Internal Medicine

## 2019-05-03 ENCOUNTER — Other Ambulatory Visit: Payer: Self-pay

## 2019-05-03 DIAGNOSIS — Z8669 Personal history of other diseases of the nervous system and sense organs: Secondary | ICD-10-CM | POA: Diagnosis not present

## 2019-05-03 DIAGNOSIS — G4733 Obstructive sleep apnea (adult) (pediatric): Secondary | ICD-10-CM | POA: Diagnosis not present

## 2019-05-06 DIAGNOSIS — Z8669 Personal history of other diseases of the nervous system and sense organs: Secondary | ICD-10-CM

## 2019-05-06 DIAGNOSIS — G4733 Obstructive sleep apnea (adult) (pediatric): Secondary | ICD-10-CM | POA: Diagnosis not present

## 2019-05-06 NOTE — Procedures (Signed)
    Patient Name: Lindsey Bowen, Lindsey Bowen Date: 05/03/2019 Gender: Female D.O.B: November 28, 1970 Age (years): 21 Referring Provider: Gildardo Pounds NP Height (inches): 91 Interpreting Physician: Baird Lyons MD, ABSM Weight (lbs): 220 RPSGT: Jacolyn Reedy BMI: 37 MRN: [id number] Neck Size: 15.00  CLINICAL INFORMATION Sleep Study Type: NPSG Indication for sleep study: Obesity, Snoring, Witnesses Apnea / Gasping During Sleep Epworth Sleepiness Score: 9  SLEEP STUDY TECHNIQUE As per the AASM Manual for the Scoring of Sleep and Associated Events v2.3 (April 2016) with a hypopnea requiring 4% desaturations.  The channels recorded and monitored were frontal, central and occipital EEG, electrooculogram (EOG), submentalis EMG (chin), nasal and oral airflow, thoracic and abdominal wall motion, anterior tibialis EMG, snore microphone, electrocardiogram, and pulse oximetry.  MEDICATIONS Medications self-administered by patient taken the night of the study : none reported  SLEEP ARCHITECTURE The study was initiated at 10:26:03 PM and ended at 4:52:59 AM.  Sleep onset time was 6.2 minutes and the sleep efficiency was 82.2%%. The total sleep time was 318.2 minutes.  Stage REM latency was 112.0 minutes.  The patient spent 3.5%% of the night in stage N1 sleep, 76.9%% in stage N2 sleep, 0.0%% in stage N3 and 19.6% in REM.  Alpha intrusion was absent.  Supine sleep was 0.00%.  RESPIRATORY PARAMETERS The overall apnea/hypopnea index (AHI) was 11.3 per hour. There were 2 total apneas, including 2 obstructive, 0 central and 0 mixed apneas. There were 58 hypopneas and 0 RERAs.  The AHI during Stage REM sleep was 36.5 per hour.  AHI while supine was N/A per hour.  The mean oxygen saturation was 94.5%. The minimum SpO2 during sleep was 78.0%.  loud snoring was noted during this study.  CARDIAC DATA The 2 lead EKG demonstrated sinus rhythm. The mean heart rate was 72.0 beats per minute.  Other EKG findings include: None.  LEG MOVEMENT DATA The total PLMS were 0 with a resulting PLMS index of 0.0. Associated arousal with leg movement index was 0.0 .  IMPRESSIONS - Mild obstructive sleep apnea occurred during this study (AHI = 11.3/h). - No significant central sleep apnea occurred during this study (CAI = 0.0/h). - Moderate oxygen desaturation was noted during this study (Min O2 = 78.0%). Mean sat 94.5%. - The patient snored with loud snoring volume. - No cardiac abnormalities were noted during this study. - Clinically significant periodic limb movements did not occur during sleep. No significant associated arousals.  DIAGNOSIS - Obstructive Sleep Apnea (327.23 [G47.33 ICD-10])  RECOMMENDATIONS - Suggest CPAP titration sleep study or autopap. Other options, such as a fitted oral appliance, would be based on clinical judgment - Be careful with alcohol, sedatives and other CNS depressants that may worsen sleep apnea and disrupt normal sleep architecture. - Sleep hygiene should be reviewed to assess factors that may improve sleep quality. - Weight management and regular exercise should be initiated or continued if appropriate.  [Electronically signed] 05/06/2019 09:56 AM  Baird Lyons MD, ABSM Diplomate, American Board of Sleep Medicine   NPI: [id number]                         [place] [place] [facility]  ELECTRONICALLY SIGNED ON:  05/06/2019, 9:53 AM [place] PH: [phone]   FX: [phone] [place]

## 2019-05-07 ENCOUNTER — Ambulatory Visit: Payer: Medicaid Other | Admitting: Physical Therapy

## 2019-05-09 ENCOUNTER — Other Ambulatory Visit: Payer: Self-pay

## 2019-05-09 ENCOUNTER — Other Ambulatory Visit: Payer: Self-pay | Admitting: Nurse Practitioner

## 2019-05-09 ENCOUNTER — Ambulatory Visit: Payer: Medicaid Other | Admitting: Physical Therapy

## 2019-05-09 ENCOUNTER — Encounter: Payer: Self-pay | Admitting: Physical Therapy

## 2019-05-09 DIAGNOSIS — M5442 Lumbago with sciatica, left side: Secondary | ICD-10-CM

## 2019-05-09 DIAGNOSIS — G8929 Other chronic pain: Secondary | ICD-10-CM

## 2019-05-09 DIAGNOSIS — G4733 Obstructive sleep apnea (adult) (pediatric): Secondary | ICD-10-CM

## 2019-05-09 DIAGNOSIS — R2689 Other abnormalities of gait and mobility: Secondary | ICD-10-CM

## 2019-05-09 DIAGNOSIS — M79671 Pain in right foot: Secondary | ICD-10-CM

## 2019-05-09 DIAGNOSIS — M79672 Pain in left foot: Secondary | ICD-10-CM | POA: Diagnosis not present

## 2019-05-09 NOTE — Therapy (Signed)
[facility] [address] [place], [place], [address] Phone: [phone]   Fax:  [phone]  Physical Therapy Treatment  Patient Details  Name: [name] Foucher MRN: [id number] Date of Birth: 1971-04-25 Referring Provider (PT): Dr Jillyn LedgerMathew Wagoner    Encounter Date: 05/09/2019  PT End of Session - 05/09/19 1436    Visit Number  3    Number of Visits  12    Date for PT Re-Evaluation  05/18/19    PT Start Time  1022   Patient 7 minutes late   PT Stop Time  1102    PT Time Calculation (min)  40 min    Activity Tolerance  Patient tolerated treatment well    Behavior During Therapy  [facility] for tasks assessed/performed       Past Medical History:  Diagnosis Date  . Anemia   . 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 Diabetes mellitus without complication (HCC)   . Edema   . Lumbar pain   . Onychodystrophy   . Onychomycosis   . Paresthesia 05/17/2017  . Sleep apnea   . Thyroid disease   . TIA (transient ischemic attack) 05/26/2017  . 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 CHOLECYSTECTOMY    . DILATION AND CURETTAGE OF UTERUS    . HERNIA REPAIR      There were no vitals filed for this visit.  Subjective Assessment - 05/09/19 1026    Subjective  Patient reports her feet have been sore but better. her pain seems to corelate to when her legs swell. She has had problems with sweelling for years.    Limitations  Standing;Writing;Walking    How long can you stand comfortably?  depends    How long can you walk comfortably?  depends    Diagnostic tests  Nothing    Patient Stated Goals  to have less pain    Currently in Pain?  No/denies   al ittle soreness ofver the past few days but not too bad today                      [facility] Adult PT Treatment/Exercise - 05/09/19 0001      Manual Therapy    Manual Therapy  Soft tissue mobilization;Passive ROM;Joint mobilization    Manual therapy comments  TFM to plantar facia; trigger point release to calf ; IASTYM co calf     Soft tissue mobilization  Anterior drawer glides to improve DF    Passive ROM  into DF       Ankle Exercises: Standing   Other Standing Ankle Exercises  SLS with UE support 3x15 sec each leg; slow march x10 each leg; weight shift forward without pain x20              PT Education - 05/09/19 1436    Education Details  reviewed new exercises    Person(s) Educated  Patient    Methods  Explanation;Demonstration;Tactile cues;Verbal cues    Comprehension  Verbalized understanding;Returned demonstration;Verbal cues required;Tactile cues required       PT Short Term Goals - 04/06/19 0938      PT SHORT TERM GOAL #1   Title  Patient will increase active bilateral DF by 1o degrees    Baseline  right 2 degrees left -5    Time  4    Period  Weeks  Status  New    Target Date  05/04/19      PT SHORT TERM GOAL #2   Title  Patient will increase gross bilateral ankle strength to 5/5    Baseline  left PF 3+/5 left 4/5 bilateral DF 4/5    Time  3    Period  Weeks    Status  New    Target Date  04/27/19      PT SHORT TERM GOAL #3   Title  Patient will be independent with basic stretching, strenghtening and self soft tissue mobilization program    Baseline  no program    Time  3    Period  Weeks    Status  New    Target Date  04/27/19        PT Long Term Goals - 04/06/19 0946      PT LONG TERM GOAL #1   Title  Patient will stand for 1 hour without increased pain in order to perfrom ADL's    Baseline  can not stand more then 10-15 minutes without increased pain.    Time  6    Period  Weeks    Status  New    Target Date  05/18/19      PT LONG TERM GOAL #2   Title  Patient will go up and down 12 steps without pain in order to get into her house.    Baseline  pain going up and down steps    Time  6     Period  Weeks    Status  New    Target Date  05/18/19            Plan - 05/09/19 1437    Clinical Impression Statement  Patient toleratedstanding exercises well. Therapy explanied to her how this translates to actual walking. She coinutews to have tightness in DF> She was advised to continue with stretching at home. She was also advised of our attendance policy. Patient missed last visit without calling. She was adivsedif she no-showss again she will likley be discharged.    Personal Factors and Comorbidities  Comorbidity 1    Comorbidities  obesity, diabetes, low back pain,    Examination-Activity Limitations  Locomotion Level;Stairs;Lift;Squat    Examination-Participation Restrictions  Community Activity;Meal Prep;Yard Work;Laundry;Shop    Stability/Clinical Decision Making  Stable/Uncomplicated    Clinical Decision Making  Low    Rehab Potential  Good    PT Frequency  2x / week    PT Duration  6 weeks    PT Treatment/Interventions  ADLs/Self Care Home Management;Therapeutic activities;Therapeutic exercise;Electrical Stimulation;Stair training;Gait training;Patient/family education;Manual techniques;Taping    PT Next Visit Plan  consider arch taping, manual therapy to foot; IASTYM to calf; light stretching to the calf if tolerated. give inversion strengthening and PF ;    PT Home Exercise Plan  anke DF strengthening; self trigger ponit release to calf ; TFM to plantar facia;    Consulted and Agree with Plan of Care  Patient       Patient will benefit from skilled therapeutic intervention in order to improve the following deficits and impairments:  Abnormal gait, Decreased mobility, Decreased activity tolerance, Decreased strength, Pain, Obesity, Decreased range of motion, Difficulty walking  Visit Diagnosis: Pain in left foot  Pain in right foot  Other abnormalities of gait and mobility  Chronic left-sided low back pain with left-sided sciatica     Problem List Patient  Active Problem List   Diagnosis  Date Noted  . Plantar fasciitis 02/16/2019  . History of TIA (transient ischemic attack) 06/29/2017  . Slurred speech 06/28/2017  . 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 Essential hypertension 05/27/2017  . Hyperlipidemia LDL goal <70 05/27/2017  . Hypothyroidism 05/27/2017  . TIA (transient ischemic attack) 05/26/2017  . Paresthesia 05/17/2017  . Iron deficiency 04/19/2016  . Sleep apnea 04/19/2016  . Chronic bilateral low back pain with left-sided sciatica 02/13/2016  . Onychodystrophy 08/26/2015  . Onychomycosis 08/26/2015    Dessie Coma PT DPT  05/09/2019, 2:40 PM  [facility] [address] [place], [place], [address] Phone: [phone]   Fax:  [phone]  Name: Marin Wisner MRN: [id number] Date of Birth: [birth year]

## 2019-05-11 ENCOUNTER — Telehealth (INDEPENDENT_AMBULATORY_CARE_PROVIDER_SITE_OTHER): Payer: Self-pay

## 2019-05-11 NOTE — Telephone Encounter (Signed)
-----   Message from Gildardo Pounds, NP sent at 05/09/2019 10:06 AM EST ----- Sleep study shows mile sleep apnea. You will need a CPAP titration sleep study to determine what time of CPAP you may need. They will call you to schedule

## 2019-05-11 NOTE — Telephone Encounter (Signed)
Patient identity verified by date of birth. She is aware that sleep study shows mild sleep apnea. CPAP titration needed to determine the type of CPAP you may need. Sleep center will contact you directly to schedule. She expressed understanding of results. Lindsey Bowen

## 2019-05-14 ENCOUNTER — Ambulatory Visit: Payer: Medicaid Other | Admitting: Physical Therapy

## 2019-05-15 ENCOUNTER — Encounter: Payer: Self-pay | Admitting: Physical Therapy

## 2019-05-15 ENCOUNTER — Other Ambulatory Visit: Payer: Self-pay

## 2019-05-15 ENCOUNTER — Ambulatory Visit: Payer: Medicaid Other | Admitting: Physical Therapy

## 2019-05-15 DIAGNOSIS — M79671 Pain in right foot: Secondary | ICD-10-CM | POA: Diagnosis not present

## 2019-05-15 DIAGNOSIS — R2689 Other abnormalities of gait and mobility: Secondary | ICD-10-CM

## 2019-05-15 DIAGNOSIS — G8929 Other chronic pain: Secondary | ICD-10-CM | POA: Diagnosis not present

## 2019-05-15 DIAGNOSIS — M5442 Lumbago with sciatica, left side: Secondary | ICD-10-CM | POA: Diagnosis not present

## 2019-05-15 DIAGNOSIS — M79672 Pain in left foot: Secondary | ICD-10-CM

## 2019-05-15 NOTE — Therapy (Signed)
[facility] [address] [place], [place], [address] Phone: [phone]   Fax:  [phone]  Physical Therapy Treatment  Patient Details  Name: Lindsey Bowen MRN: [id number] Date of Birth: [birth year] Referring Provider (PT): Dr Jillyn Ledger    Encounter Date: 05/15/2019  PT End of Session - 05/15/19 1340    Visit Number  4    Number of Visits  12    Date for PT Re-Evaluation  05/18/19    PT Start Time  1104    PT Stop Time  1145    PT Time Calculation (min)  41 min    Activity Tolerance  Patient tolerated treatment well    Behavior During Therapy  [facility] for tasks assessed/performed       Past Medical History:  Diagnosis Date  . Anemia   . 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 Diabetes mellitus without complication (HCC)   . Edema   . Lumbar pain   . Onychodystrophy   . Onychomycosis   . Paresthesia 05/17/2017  . Sleep apnea   . Thyroid disease   . TIA (transient ischemic attack) 05/26/2017  . 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 CHOLECYSTECTOMY    . DILATION AND CURETTAGE OF UTERUS    . HERNIA REPAIR      There were no vitals filed for this visit.  Subjective Assessment - 05/15/19 1333    Subjective  Patient reports her feet are better buts she still has pain in the morning. She has een working on some of her stretches and exercises.    Limitations  Standing;Writing;Walking    How long can you stand comfortably?  depends    How long can you walk comfortably?  depends    Diagnostic tests  Nothing    Patient Stated Goals  to have less pain    Currently in Pain?  Yes    Pain Score  5     Pain Location  Foot    Pain Orientation  Right;Left    Pain Descriptors / Indicators  Aching    Pain Type  Chronic pain    Pain Onset  More than a month ago    Pain Frequency  Constant    Aggravating Factors   standingand walking    Multiple Pain Sites  No                       [facility] Adult PT Treatment/Exercise - 05/15/19 0001      Manual Therapy   Manual Therapy  Soft tissue mobilization;Passive ROM;Joint mobilization    Manual therapy comments  TFM to plantar facia; trigger point release to calf ; IASTYM co calf     Soft tissue mobilization  Anterior drawer glides to improve DF    Passive ROM  into DF       Ankle Exercises: Stretches   Gastroc Stretch  3 reps;20 seconds;Limitations    Gastroc Stretch Limitations  with strap towrds the middle of the foot      Ankle Exercises: Standing   Other Standing Ankle Exercises  slow standing march x20       Ankle Exercises: Supine   T-Band  DF x20 green; inversion x20 green       Ankle Exercises: Seated   Other Seated Ankle Exercises  towel scruntches x20  PT Education - 05/15/19 1340    Education Details  HEP and symptom management    Person(s) Educated  Patient    Methods  Explanation;Demonstration;Tactile cues;Verbal cues    Comprehension  Verbalized understanding;Returned demonstration;Verbal cues required;Tactile cues required       PT Short Term Goals - 05/15/19 1605      PT SHORT TERM GOAL #1   Title  Patient will increase active bilateral DF by 1o degrees    Baseline  right 2 degrees left -5    Time  4    Period  Weeks    Status  On-going    Target Date  05/04/19      PT SHORT TERM GOAL #2   Title  Patient will increase gross bilateral ankle strength to 5/5    Baseline  left PF 3+/5 left 4/5 bilateral DF 4/5    Time  3    Period  Weeks    Status  On-going    Target Date  04/27/19      PT SHORT TERM GOAL #3   Title  Patient will be independent with basic stretching, strenghtening and self soft tissue mobilization program    Baseline  no program    Time  3    Period  Weeks    Status  On-going    Target Date  04/27/19        PT Long Term Goals - 04/06/19  0946      PT LONG TERM GOAL #1   Title  Patient will stand for 1 hour without increased pain in order to perfrom ADL's    Baseline  can not stand more then 10-15 minutes without increased pain.    Time  6    Period  Weeks    Status  New    Target Date  05/18/19      PT LONG TERM GOAL #2   Title  Patient will go up and down 12 steps without pain in order to get into her house.    Baseline  pain going up and down steps    Time  6    Period  Weeks    Status  New    Target Date  05/18/19            Plan - 05/15/19 1341    Clinical Impression Statement  Patient's DF is about the same. She was strongly encouragd to continue workingon it. Therapy addressed the patients attendance issues. She has cancelled and no-showed 6/10 visits. Therapy reviewed our attendance policy. She will schedule 1 more visit. therapy gave her 5 things to work on at home. She will wok on them for 2 weeks then we will follow up.    Comorbidities  obesity, diabetes, low back pain,    Examination-Participation Restrictions  Community Activity;Meal Prep;Yard Work;Laundry;Shop    Stability/Clinical Decision Making  Stable/Uncomplicated    Clinical Decision Making  Low    Rehab Potential  Good    PT Frequency  2x / week    PT Duration  6 weeks    PT Treatment/Interventions  ADLs/Self Care Home Management;Therapeutic activities;Therapeutic exercise;Electrical Stimulation;Stair training;Gait training;Patient/family education;Manual techniques;Taping    PT Next Visit Plan  consider arch taping, manual therapy to foot; IASTYM to calf; light stretching to the calf if tolerated. give inversion strengthening and PF ;    PT Home Exercise Plan  anke DF strengthening; self trigger ponit release to calf ; TFM to plantar facia;  Consulted and Agree with Plan of Care  Patient       Patient will benefit from skilled therapeutic intervention in order to improve the following deficits and impairments:  Abnormal gait, Decreased  mobility, Decreased activity tolerance, Decreased strength, Pain, Obesity, Decreased range of motion, Difficulty walking  Visit Diagnosis: Pain in left foot  Pain in right foot  Other abnormalities of gait and mobility     Problem List Patient Active Problem List   Diagnosis Date Noted  . Plantar fasciitis 02/16/2019  . History of TIA (transient ischemic attack) 06/29/2017  . Slurred speech 06/28/2017  . Type 2 diabetes mellitus with microalbuminuria ([place]) 05/27/2017  . Diabetes mellitus type 2 with neurological manifestations ([place]) 05/27/2017  . Essential hypertension 05/27/2017  . Hyperlipidemia LDL goal <70 05/27/2017  . Hypothyroidism 05/27/2017  . TIA (transient ischemic attack) 05/26/2017  . Paresthesia 05/17/2017  . Iron deficiency 04/19/2016  . Sleep apnea 04/19/2016  . Chronic bilateral low back pain with left-sided sciatica 02/13/2016  . Onychodystrophy 08/26/2015  . Onychomycosis 08/26/2015    Carney Living PT DPT  05/15/2019, 4:06 PM  [facility] [address] [place], [place], [address] Phone: [phone]   Fax:  [phone]  Name: Maisie Hauser MRN: [id number] Date of Birth: March 12, 1971

## 2019-05-16 DIAGNOSIS — E1165 Type 2 diabetes mellitus with hyperglycemia: Secondary | ICD-10-CM | POA: Diagnosis not present

## 2019-05-16 DIAGNOSIS — E1129 Type 2 diabetes mellitus with other diabetic kidney complication: Secondary | ICD-10-CM | POA: Diagnosis not present

## 2019-05-16 DIAGNOSIS — R809 Proteinuria, unspecified: Secondary | ICD-10-CM | POA: Diagnosis not present

## 2019-05-16 DIAGNOSIS — Z794 Long term (current) use of insulin: Secondary | ICD-10-CM | POA: Diagnosis not present

## 2019-05-30 ENCOUNTER — Ambulatory Visit: Payer: Medicaid Other | Attending: Podiatry | Admitting: Physical Therapy

## 2019-05-30 ENCOUNTER — Other Ambulatory Visit: Payer: Self-pay

## 2019-05-30 DIAGNOSIS — R2689 Other abnormalities of gait and mobility: Secondary | ICD-10-CM | POA: Diagnosis not present

## 2019-05-30 DIAGNOSIS — M79671 Pain in right foot: Secondary | ICD-10-CM | POA: Diagnosis not present

## 2019-05-30 DIAGNOSIS — M79672 Pain in left foot: Secondary | ICD-10-CM | POA: Insufficient documentation

## 2019-05-31 NOTE — Therapy (Signed)
[place], [place], [address] Phone: [phone]   Fax:  [phone]  Physical Therapy Treatment/Discharge   Patient Details  Name: Vernice Mannina MRN: [id number] Date of Birth: 1970-10-18 Referring Provider (PT): Dr Annice Needy    Encounter Date: 05/30/2019  PT End of Session - 05/30/19 2055    Visit Number  5    Number of Visits  12    Date for PT Re-Evaluation  05/18/19    PT Start Time  1148    PT Stop Time  1228    PT Time Calculation (min)  40 min    Activity Tolerance  Patient tolerated treatment well    Behavior During Therapy  [facility] for tasks assessed/performed       Past Medical History:  Diagnosis Date  . Anemia   . Diabetes mellitus type 2 with neurological manifestations ([place]) 05/27/2017  . Diabetes mellitus without complication ([place])   . Edema   . Lumbar pain   . Onychodystrophy   . Onychomycosis   . Paresthesia 05/17/2017  . Sleep apnea   . Thyroid disease   . TIA (transient ischemic attack) 05/26/2017  . Type 2 diabetes mellitus with microalbuminuria ([place]) 05/27/2017    Past Surgical History:  Procedure Laterality Date  . CHOLECYSTECTOMY    . DILATION AND CURETTAGE OF UTERUS    . HERNIA REPAIR      There were no vitals filed for this visit.  Subjective Assessment - 05/30/19 2033    Subjective  The patient reports she hasbeen letting a hot tub blow on her foot and it is feeling much better. She has been working on her stretches and exercises. She reports minor pain at times in her right foot mostly when she is sleeping.    Limitations  Standing;Writing;Walking    How long can you stand comfortably?  depends    How long can you walk comfortably?  depends    Diagnostic tests  Nothing    Patient Stated Goals  to have less pain    Currently in Pain?  No/denies         [facility] PT Assessment - 05/31/19 0001      AROM   Right Ankle Dorsiflexion  5    Left Ankle  Dorsiflexion  10      PROM   Right Ankle Dorsiflexion  8    Left Ankle Dorsiflexion  13      Strength   Right Ankle Dorsiflexion  4+/5    Right Ankle Inversion  5/5    Right Ankle Eversion  5/5    Left Ankle Dorsiflexion  4+/5    Left Ankle Inversion  5/5    Left Ankle Eversion  5/5      Palpation   Palpation comment  mild tenderness to palpation in the medial  right heel       Ambulation/Gait   Gait Comments  improved forward motion but still has some lateral motion. Improved single leg stance                    [facility] Adult PT Treatment/Exercise - 05/31/19 0001      Self-Care   Self-Care  Other Self-Care Comments    Other Self-Care Comments   reviewed how to progress exercises and how to use her HEP. Patient advised to keep stretching calves.       Manual Therapy   Manual Therapy  Soft tissue mobilization;Passive ROM;Joint mobilization  Manual therapy comments  TFM to plantar facia; trigger point release to calf ; IASTYM co calf     Soft tissue mobilization  Anterior drawer glides to improve DF    Passive ROM  into DF       Ankle Exercises: Stretches   Gastroc Stretch  3 reps;20 seconds;Limitations    Gastroc Stretch Limitations  with strap towrds the middle of the foot      Ankle Exercises: Supine   T-Band  DF x20 green; inversion x20 green ev green; PF green       Ankle Exercises: Standing   Other Standing Ankle Exercises  slow standing march x20 reviewed for final HEP       Ankle Exercises: Seated   Other Seated Ankle Exercises  towel scruntches x20              PT Education - 05/30/19 2054    Education Details  reviewedfinal HEP    Person(s) Educated  Patient    Methods  Explanation;Demonstration;Tactile cues;Verbal cues    Comprehension  Verbalized understanding;Returned demonstration;Verbal cues required       PT Short Term Goals - 05/31/19 1156      PT SHORT TERM GOAL #1   Title  Patient will increase active bilateral DF by 1o  degrees    Baseline  5 on right 15 on left    Time  4    Period  Weeks    Status  Partially Met    Target Date  05/04/19      PT SHORT TERM GOAL #2   Title  Patient will increase gross bilateral ankle strength to 5/5    Baseline  5/5 with movements measured    Time  3    Period  Weeks    Status  Achieved    Target Date  04/27/19      PT SHORT TERM GOAL #3   Title  Patient will be independent with basic stretching, strenghtening and self soft tissue mobilization program    Baseline  has a complete program    Time  3    Period  Weeks    Status  Achieved        PT Long Term Goals - 05/31/19 1204      PT LONG TERM GOAL #1   Title  Patient will stand for 1 hour without increased pain in order to perfrom ADL's    Baseline  can stand about an hour. More pain in the morning    Time  6    Period  Weeks    Status  Achieved      PT LONG TERM GOAL #2   Title  Patient will go up and down 12 steps without pain in order to get into her house.    Baseline  mild pain at times but doing mcu better    Time  6    Period  Weeks    Status  Achieved      PT LONG TERM GOAL #3   Title  Pt will be able to stand as needed for home, cooking, washing dishes with pain no more than 3/10.     Baseline  1 hour then has to sit or stretch    Time  6    Period  Weeks    Status  Achieved            Plan - 05/30/19 2058    Clinical Impression Statement  Patient feels comfortable  with her program. She is working on her stretches and exercises. Her pain has improved significantly. Her left ankle DF is 10 degrees active, her right is only 5 degrees. She was advised she still has work to do one the right. See below for goal specific progress.    Comorbidities  obesity, diabetes, low back pain,    Examination-Activity Limitations  Locomotion Level;Stairs;Lift;Squat    Stability/Clinical Decision Making  Stable/Uncomplicated    Clinical Decision Making  Low    Rehab Potential  Good    PT Frequency   2x / week    PT Duration  6 weeks    PT Treatment/Interventions  ADLs/Self Care Home Management;Therapeutic activities;Therapeutic exercise;Electrical Stimulation;Stair training;Gait training;Patient/family education;Manual techniques;Taping    PT Next Visit Plan  consider arch taping, manual therapy to foot; IASTYM to calf; light stretching to the calf if tolerated. give inversion strengthening and PF ;    PT Home Exercise Plan  anke DF strengthening; self trigger ponit release to calf ; TFM to plantar facia;    Consulted and Agree with Plan of Care  Patient       Patient will benefit from skilled therapeutic intervention in order to improve the following deficits and impairments:  Abnormal gait, Decreased mobility, Decreased activity tolerance, Decreased strength, Pain, Obesity, Decreased range of motion, Difficulty walking  Visit Diagnosis: Pain in left foot  Pain in right foot  Other abnormalities of gait and mobility     Problem List Patient Active Problem List   Diagnosis Date Noted  . Plantar fasciitis 02/16/2019  . History of TIA (transient ischemic attack) 06/29/2017  . Slurred speech 06/28/2017  . Type 2 diabetes mellitus with microalbuminuria ([place]) 05/27/2017  . Diabetes mellitus type 2 with neurological manifestations ([place]) 05/27/2017  . Essential hypertension 05/27/2017  . Hyperlipidemia LDL goal <70 05/27/2017  . Hypothyroidism 05/27/2017  . TIA (transient ischemic attack) 05/26/2017  . Paresthesia 05/17/2017  . Iron deficiency 04/19/2016  . Sleep apnea 04/19/2016  . Chronic bilateral low back pain with left-sided sciatica 02/13/2016  . Onychodystrophy 08/26/2015  . Onychomycosis 08/26/2015    PHYSICAL THERAPY DISCHARGE SUMMARY  Visits from Start of Care: 5  Current functional level related to goals / functional outcomes: Significant improvement in pain and function    Remaining deficits: Mild pain  Education / Equipment: PT DPT   Plan: Patient  agrees to discharge.  Patient goals were met. Patient is being discharged due to being pleased with the current functional level.  ?????      Carney Living 05/31/2019, 12:11 PM  [facility] [address] [place], [place], [address] Phone: [phone]   Fax:  [phone]  Name: Nicoletta Hush MRN: [id number] Date of Birth: June 08, 1971

## 2019-05-31 NOTE — Addendum Note (Signed)
Addended by: Carney Living on: 05/31/2019 12:15 PM   Modules accepted: Orders

## 2019-06-01 ENCOUNTER — Other Ambulatory Visit: Payer: Self-pay

## 2019-06-01 ENCOUNTER — Encounter: Payer: Self-pay | Admitting: Nurse Practitioner

## 2019-06-01 ENCOUNTER — Ambulatory Visit: Payer: Medicaid Other | Attending: Nurse Practitioner | Admitting: Nurse Practitioner

## 2019-06-01 ENCOUNTER — Other Ambulatory Visit (HOSPITAL_COMMUNITY)
Admission: RE | Admit: 2019-06-01 | Discharge: 2019-06-01 | Disposition: A | Discharge status: Home / Self Care | Payer: Medicaid Other | Source: Ambulatory Visit | Attending: Nurse Practitioner | Admitting: Nurse Practitioner

## 2019-06-01 VITALS — BP 124/80 | HR 87 | Temp 98.6°F | Ht 64.0 in | Wt 221.0 lb

## 2019-06-01 DIAGNOSIS — Z79899 Other long term (current) drug therapy: Secondary | ICD-10-CM | POA: Insufficient documentation

## 2019-06-01 DIAGNOSIS — E1149 Type 2 diabetes mellitus with other diabetic neurological complication: Secondary | ICD-10-CM | POA: Insufficient documentation

## 2019-06-01 DIAGNOSIS — Z124 Encounter for screening for malignant neoplasm of cervix: Secondary | ICD-10-CM | POA: Insufficient documentation

## 2019-06-01 DIAGNOSIS — Z91013 Allergy to seafood: Secondary | ICD-10-CM | POA: Diagnosis not present

## 2019-06-01 DIAGNOSIS — E079 Disorder of thyroid, unspecified: Secondary | ICD-10-CM | POA: Insufficient documentation

## 2019-06-01 DIAGNOSIS — Z79890 Hormone replacement therapy (postmenopausal): Secondary | ICD-10-CM | POA: Diagnosis not present

## 2019-06-01 DIAGNOSIS — Z794 Long term (current) use of insulin: Secondary | ICD-10-CM | POA: Insufficient documentation

## 2019-06-01 DIAGNOSIS — Z8249 Family history of ischemic heart disease and other diseases of the circulatory system: Secondary | ICD-10-CM | POA: Insufficient documentation

## 2019-06-01 DIAGNOSIS — G4730 Sleep apnea, unspecified: Secondary | ICD-10-CM | POA: Insufficient documentation

## 2019-06-01 DIAGNOSIS — Z8673 Personal history of transient ischemic attack (TIA), and cerebral infarction without residual deficits: Secondary | ICD-10-CM | POA: Diagnosis not present

## 2019-06-01 DIAGNOSIS — Z833 Family history of diabetes mellitus: Secondary | ICD-10-CM | POA: Insufficient documentation

## 2019-06-01 DIAGNOSIS — Z01419 Encounter for gynecological examination (general) (routine) without abnormal findings: Secondary | ICD-10-CM | POA: Diagnosis not present

## 2019-06-01 NOTE — Progress Notes (Signed)
Assessment & Plan:  Lindsey Bowen was seen today for gynecologic exam.  Diagnoses and all orders for this visit:  Encounter for Papanicolaou smear for cervical cancer screening -     PAP WITH EVERYTHING -     WET PREP    Patient has been counseled on age-appropriate routine health concerns for screening and prevention. These are reviewed and up-to-date. Referrals have been placed accordingly. Immunizations are up-to-date or declined.    Subjective:   Chief Complaint  Patient presents with  . Gynecologic Exam    Pt. is here for a pap smear.    HPI Lindsey Bowen 48 y.o. female presents to office today for PAP  Review of Systems  Constitutional: Negative.  Negative for chills, fever, malaise/fatigue and weight loss.  Respiratory: Negative.  Negative for cough, shortness of breath and wheezing.   Cardiovascular: Negative.  Negative for chest pain, orthopnea and leg swelling.  Gastrointestinal: Negative for abdominal pain.  Genitourinary: Negative.  Negative for flank pain.  Skin: Negative.  Negative for rash.  Psychiatric/Behavioral: Negative for suicidal ideas.    Past Medical History:  Diagnosis Date  . Anemia   . 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 Diabetes mellitus without complication (HCC)   . Edema   . Lumbar pain   . Onychodystrophy   . Onychomycosis   . Paresthesia 05/17/2017  . Sleep apnea   . Thyroid disease   . TIA (transient ischemic attack) 05/26/2017  . 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 CHOLECYSTECTOMY    . DILATION AND CURETTAGE OF UTERUS    . HERNIA REPAIR      Family History  Problem Relation Age of Onset  . Diabetes Mother   . Hypertension Mother   . Heart attack Maternal Grandmother   . Stroke Maternal Grandmother   . Diabetes Maternal Grandfather   . Cancer Maternal Grandfather     Social History Reviewed with  no changes to be made today.   Outpatient Medications Prior to Visit  Medication Sig Dispense Refill  . cyclobenzaprine (FLEXERIL) 5 MG tablet Take 5 mg by mouth 3 (three) times daily as needed for muscle spasms.    . diclofenac sodium (VOLTAREN) 1 % GEL Apply 2 g topically 4 (four) times daily. Rub into affected area of foot 2 to 4 times daily 100 g 2  . fluconazole (DIFLUCAN) 150 MG tablet Take 1 tablet (150 mg total) by mouth once a week. 12 tablet 0  . fluticasone (FLONASE) 50 MCG/ACT nasal spray Place 2 sprays into both nostrils daily. 16 g 2  . hydrochlorothiazide (HYDRODIURIL) 25 MG tablet Take 25 mg by mouth daily.    . insulin aspart (NOVOLOG) 100 UNIT/ML injection Inject 15 Units into the skin 3 (three) times daily with meals.     . Insulin Disposable Pump (OMNIPOD 10 PACK) MISC by Does not apply route.    Marland Kitchen levothyroxine (SYNTHROID, LEVOTHROID) 175 MCG tablet Take 1 tablet (175 mcg total) by mouth daily before breakfast. 30 tablet 0  . lisinopril (PRINIVIL,ZESTRIL) 5 MG tablet Take 5 mg by mouth daily.    . Multiple Vitamin (MULTIVITAMIN) tablet Take 1 tablet by mouth daily.    . mupirocin ointment (BACTROBAN) 2 % Apply to affected area twice daily 30 g 0  . naproxen sodium (ALEVE) 220 MG tablet Take 220 mg by mouth daily as needed (headache).    Marland Kitchen  rosuvastatin (CRESTOR) 20 MG tablet Take 1 tablet (20 mg total) by mouth daily. 90 tablet 2  . sodium chloride (OCEAN) 0.65 % SOLN nasal spray Place 1 spray into both nostrils as needed for congestion.  0  . acetaminophen (TYLENOL) 650 MG CR tablet Take 650 mg by mouth every 8 (eight) hours as needed for pain.    Marland Kitchen. albuterol (VENTOLIN HFA) 108 (90 Base) MCG/ACT inhaler Inhale into the lungs.    . cetirizine (ZYRTEC) 10 MG tablet TAKE 1 TABLET BY MOUTH EVERY DAY 90 tablet 0  . Dulaglutide (TRULICITY) 1.5 MG/0.5ML SOPN Inject 1.5 mg into the skin once a week.     . Insulin Glargine (LANTUS [place]) Inject 40 Units into the skin at bedtime.       No facility-administered medications prior to visit.     Allergies  Allergen Reactions  . Dust Mite Extract Hives and Itching  . Shellfish Allergy Anaphylaxis       Objective:    BP 124/80 (BP Location: Left Arm, Patient Position: Sitting, Cuff Size: Large)   Pulse 87   Temp 98.6 F (37 C) (Oral)   Ht 5\' 4"  (1.626 m)   Wt 221 lb (100.2 kg)   SpO2 95%   BMI 37.93 kg/m  Wt Readings from Last 3 Encounters:  06/01/19 221 lb (100.2 kg)  04/16/19 224 lb (101.6 kg)  05/26/17 197 lb 5 oz (89.5 kg)    Physical Exam Exam conducted with a chaperone present.  Constitutional:      Appearance: She is well-developed.  HENT:     Head: Normocephalic.  Cardiovascular:     Rate and Rhythm: Normal rate and regular rhythm.     Heart sounds: Normal heart sounds.  Pulmonary:     Effort: Pulmonary effort is normal.     Breath sounds: Normal breath sounds.  Abdominal:     General: Bowel sounds are normal.     Palpations: Abdomen is soft.     Hernia: There is no hernia in the left inguinal area.  Genitourinary:    Labia:        Right: No rash, tenderness, lesion or injury.        Left: No rash, tenderness, lesion or injury.      Vagina: Normal. No signs of injury and foreign body. No vaginal discharge, erythema, tenderness or bleeding.     Cervix: No cervical motion tenderness or friability.     Uterus: Not deviated and not enlarged.      Adnexa:        Right: No mass, tenderness or fullness.         Left: No mass, tenderness or fullness.       Rectum: Normal. No external hemorrhoid.     Comments: IUD strings not visible Lymphadenopathy:     Lower Body: No right inguinal adenopathy. No left inguinal adenopathy.  Skin:    General: Skin is warm and dry.  Neurological:     Mental Status: She is alert and oriented to person, place, and time.  Psychiatric:        Behavior: Behavior normal.        Thought Content: Thought content normal.        Judgment: Judgment normal.           Patient has been counseled extensively about nutrition and exercise as well as the importance of adherence with medications and regular follow-up. The patient was given clear instructions to go to ER or  return to medical center if symptoms don't improve, worsen or new problems develop. The patient verbalized understanding.   Follow-up: Return if symptoms worsen or fail to improve.   Gildardo Pounds, FNP-BC [facility] and [place], [place]   06/01/2019, 10:35 AM

## 2019-06-04 ENCOUNTER — Inpatient Hospital Stay (HOSPITAL_COMMUNITY): Admission: RE | Admit: 2019-06-04 | Payer: Medicaid Other | Source: Ambulatory Visit

## 2019-06-04 DIAGNOSIS — E1165 Type 2 diabetes mellitus with hyperglycemia: Secondary | ICD-10-CM | POA: Diagnosis not present

## 2019-06-04 LAB — CYTOLOGY - PAP
Comment: NEGATIVE
Diagnosis: NEGATIVE
High risk HPV: NEGATIVE

## 2019-06-04 LAB — CERVICOVAGINAL ANCILLARY ONLY
Bacterial Vaginitis (gardnerella): NEGATIVE
Candida Glabrata: NEGATIVE
Candida Vaginitis: NEGATIVE
Chlamydia: NEGATIVE
Comment: NEGATIVE
Comment: NEGATIVE
Comment: NEGATIVE
Comment: NEGATIVE
Comment: NEGATIVE
Comment: NORMAL
Neisseria Gonorrhea: NEGATIVE
Trichomonas: NEGATIVE

## 2019-06-07 ENCOUNTER — Encounter (HOSPITAL_BASED_OUTPATIENT_CLINIC_OR_DEPARTMENT_OTHER): Payer: Medicaid Other | Admitting: Internal Medicine

## 2019-06-14 ENCOUNTER — Ambulatory Visit: Payer: Medicaid Other | Admitting: Podiatry

## 2019-06-15 ENCOUNTER — Ambulatory Visit: Payer: Medicaid Other | Admitting: Podiatry

## 2019-06-18 ENCOUNTER — Other Ambulatory Visit: Payer: Self-pay

## 2019-06-18 ENCOUNTER — Ambulatory Visit: Payer: Medicaid Other | Admitting: Podiatry

## 2019-06-18 DIAGNOSIS — B351 Tinea unguium: Secondary | ICD-10-CM

## 2019-06-18 DIAGNOSIS — M722 Plantar fascial fibromatosis: Secondary | ICD-10-CM | POA: Diagnosis not present

## 2019-06-24 NOTE — Progress Notes (Signed)
Subjective: 47 year old female presents the unsafe evaluation left foot pain, plantar fasciitis.  She states that the physical therapy did help some she still doing stretching at home.  She states that she did get in a hot tub and the gel from the foot made her feel much better she is a foot massager, but she has not yet tried this.  The right foot is been doing well.  She also said the nail fungus is improved with the fluconazole and she has finished the medication. Denies any systemic complaints such as fevers, chills, nausea, vomiting. No acute changes since last appointment, and no other complaints at this time.   Objective: AAO x3, NAD DP/PT pulses palpable bilaterally, CRT less than 3 seconds There is no minimal although improved tenderness palpation of the plantar medial tubercle of the calcaneus at the insertion of the metatarsal and the left side.  Plantar fascia appears to be intact.  There is no pain about compression of the calcaneus.  No pain with Achilles tendon.  No other areas of discomfort identified.  Overall the toenails are much improved with no pain to the nail there is no evidence of drainage or any signs of infection.  No open lesions or pre-ulcerative lesions.  No pain with calf compression, swelling, warmth, erythema  Assessment: Resolving left foot pain, plantar fasciitis; onychomycosis  Plan: -All treatment options discussed with the patient including all alternatives, risks, complications.  -Overall plan fasciitis is much improved.  Continue with stretching, icing.  We also discussed that she can use the foot massager, bath that she purchased.  Also discussed shoe modifications and orthotics I think when there is shoes this can be helpful for her.  Would not hold off another steroid injection today she is doing much better. -For now we will hold off any further treatment for nail fungus of the nails much improved.  If any reoccurrence continue other round of fluconazole no  changes. -Patient encouraged to call the office with any questions, concerns, change in symptoms.   Return in about 6 months (around 12/17/2019) for diabetic foot exam.  Trula Slade DPM

## 2019-06-27 DIAGNOSIS — E1169 Type 2 diabetes mellitus with other specified complication: Secondary | ICD-10-CM | POA: Diagnosis not present

## 2019-06-27 DIAGNOSIS — E785 Hyperlipidemia, unspecified: Secondary | ICD-10-CM | POA: Diagnosis not present

## 2019-06-27 DIAGNOSIS — E039 Hypothyroidism, unspecified: Secondary | ICD-10-CM | POA: Diagnosis not present

## 2019-06-27 DIAGNOSIS — E1165 Type 2 diabetes mellitus with hyperglycemia: Secondary | ICD-10-CM | POA: Diagnosis not present

## 2019-06-27 DIAGNOSIS — Z794 Long term (current) use of insulin: Secondary | ICD-10-CM | POA: Diagnosis not present

## 2019-06-27 DIAGNOSIS — E1129 Type 2 diabetes mellitus with other diabetic kidney complication: Secondary | ICD-10-CM | POA: Diagnosis not present

## 2019-06-27 DIAGNOSIS — R809 Proteinuria, unspecified: Secondary | ICD-10-CM | POA: Diagnosis not present

## 2019-07-09 DIAGNOSIS — E1165 Type 2 diabetes mellitus with hyperglycemia: Secondary | ICD-10-CM | POA: Diagnosis not present

## 2019-08-15 ENCOUNTER — Encounter (HOSPITAL_COMMUNITY): Payer: Self-pay

## 2019-08-15 ENCOUNTER — Other Ambulatory Visit: Payer: Self-pay

## 2019-08-15 ENCOUNTER — Ambulatory Visit (HOSPITAL_COMMUNITY)
Admission: EM | Admit: 2019-08-15 | Discharge: 2019-08-15 | Disposition: A | Discharge status: Home / Self Care | Payer: Medicaid Other | Attending: Urgent Care | Admitting: Urgent Care

## 2019-08-15 DIAGNOSIS — R0602 Shortness of breath: Secondary | ICD-10-CM | POA: Insufficient documentation

## 2019-08-15 DIAGNOSIS — E119 Type 2 diabetes mellitus without complications: Secondary | ICD-10-CM

## 2019-08-15 DIAGNOSIS — R067 Sneezing: Secondary | ICD-10-CM | POA: Diagnosis not present

## 2019-08-15 DIAGNOSIS — B349 Viral infection, unspecified: Secondary | ICD-10-CM

## 2019-08-15 DIAGNOSIS — Z7901 Long term (current) use of anticoagulants: Secondary | ICD-10-CM | POA: Insufficient documentation

## 2019-08-15 DIAGNOSIS — R05 Cough: Secondary | ICD-10-CM | POA: Diagnosis not present

## 2019-08-15 DIAGNOSIS — E1165 Type 2 diabetes mellitus with hyperglycemia: Secondary | ICD-10-CM | POA: Diagnosis not present

## 2019-08-15 DIAGNOSIS — E079 Disorder of thyroid, unspecified: Secondary | ICD-10-CM | POA: Diagnosis not present

## 2019-08-15 DIAGNOSIS — Z79899 Other long term (current) drug therapy: Secondary | ICD-10-CM | POA: Insufficient documentation

## 2019-08-15 DIAGNOSIS — J029 Acute pharyngitis, unspecified: Secondary | ICD-10-CM | POA: Insufficient documentation

## 2019-08-15 DIAGNOSIS — Z794 Long term (current) use of insulin: Secondary | ICD-10-CM | POA: Insufficient documentation

## 2019-08-15 DIAGNOSIS — Z20822 Contact with and (suspected) exposure to covid-19: Secondary | ICD-10-CM | POA: Insufficient documentation

## 2019-08-15 DIAGNOSIS — Z8673 Personal history of transient ischemic attack (TIA), and cerebral infarction without residual deficits: Secondary | ICD-10-CM | POA: Insufficient documentation

## 2019-08-15 MED ORDER — PROMETHAZINE-DM 6.25-15 MG/5ML PO SYRP: 5.0000 mL | ORAL_SOLUTION | Freq: Every evening | ORAL | 0 refills | Status: DC | PRN

## 2019-08-15 MED ORDER — BENZONATATE 100 MG PO CAPS: 100.0000 mg | ORAL_CAPSULE | Freq: Three times a day (TID) | ORAL | 0 refills | Status: DC | PRN

## 2019-08-15 NOTE — ED Provider Notes (Signed)
[place]   MRN: [id number] DOB: 1971/04/14  Subjective:   Lindsey Bowen is a 49 y.o. female presenting for 1 day history of acute onset mild persistent malaise.  She has had a mild sore throat, sneezing and occasional cough.  Patient states that she has baseline shortness of breath but this is not worse.  Denies fever, chest pain.  Has uncontrolled diabetes, blood sugars are in the 200s but improving per patient.  She is working closely with her PCP for management of her insulin.  No current facility-administered medications for this encounter.  Current Outpatient Medications:  .  acetaminophen (TYLENOL) 650 MG CR tablet, Take 650 mg by mouth every 8 (eight) hours as needed for pain., Disp: , Rfl:  .  albuterol (VENTOLIN HFA) 108 (90 Base) MCG/ACT inhaler, Inhale into the lungs., Disp: , Rfl:  .  benzonatate (TESSALON) 100 MG capsule, Take 1-2 capsules (100-200 mg total) by mouth 3 (three) times daily as needed., Disp: 60 capsule, Rfl: 0 .  cetirizine (ZYRTEC) 10 MG tablet, TAKE 1 TABLET BY MOUTH EVERY DAY, Disp: 90 tablet, Rfl: 0 .  cyclobenzaprine (FLEXERIL) 5 MG tablet, Take 5 mg by mouth 3 (three) times daily as needed for muscle spasms., Disp: , Rfl:  .  diclofenac sodium (VOLTAREN) 1 % GEL, Apply 2 g topically 4 (four) times daily. Rub into affected area of foot 2 to 4 times daily, Disp: 100 g, Rfl: 2 .  Dulaglutide (TRULICITY) 1.5 GG/2.6RS SOPN, Inject 1.5 mg into the skin once a week. , Disp: , Rfl:  .  fluconazole (DIFLUCAN) 150 MG tablet, Take 1 tablet (150 mg total) by mouth once a week., Disp: 12 tablet, Rfl: 0 .  fluticasone (FLONASE) 50 MCG/ACT nasal spray, Place 2 sprays into both nostrils daily., Disp: 16 g, Rfl: 2 .  hydrochlorothiazide (HYDRODIURIL) 25 MG tablet, Take 25 mg by mouth daily., Disp: , Rfl:  .  insulin aspart (NOVOLOG) 100 UNIT/ML injection, Inject 15 Units into the skin 3 (three) times daily with meals. , Disp: , Rfl:  .  Insulin Disposable Pump  (OMNIPOD 10 PACK) MISC, by Does not apply route., Disp: , Rfl:  .  Insulin Glargine (LANTUS [place]), Inject 40 Units into the skin at bedtime. , Disp: , Rfl:  .  levothyroxine (SYNTHROID, LEVOTHROID) 175 MCG tablet, Take 1 tablet (175 mcg total) by mouth daily before breakfast., Disp: 30 tablet, Rfl: 0 .  lisinopril (PRINIVIL,ZESTRIL) 5 MG tablet, Take 5 mg by mouth daily., Disp: , Rfl:  .  Multiple Vitamin (MULTIVITAMIN) tablet, Take 1 tablet by mouth daily., Disp: , Rfl:  .  mupirocin ointment (BACTROBAN) 2 %, Apply to affected area twice daily, Disp: 30 g, Rfl: 0 .  naproxen sodium (ALEVE) 220 MG tablet, Take 220 mg by mouth daily as needed (headache)., Disp: , Rfl:  .  promethazine-dextromethorphan (PROMETHAZINE-DM) 6.25-15 MG/5ML syrup, Take 5 mLs by mouth at bedtime as needed for cough., Disp: 100 mL, Rfl: 0 .  rosuvastatin (CRESTOR) 20 MG tablet, Take 1 tablet (20 mg total) by mouth daily., Disp: 90 tablet, Rfl: 2 .  sodium chloride (OCEAN) 0.65 % SOLN nasal spray, Place 1 spray into both nostrils as needed for congestion., Disp: , Rfl: 0   Allergies  Allergen Reactions  . Dust Mite Extract Hives and Itching  . Shellfish Allergy Anaphylaxis    Past Medical History:  Diagnosis Date  . Anemia   . Diabetes mellitus type 2 with neurological manifestations ([place]) 05/27/2017  .  Diabetes mellitus without complication (HCC)   . Edema   . Lumbar pain   . Onychodystrophy   . Onychomycosis   . Paresthesia 05/17/2017  . Sleep apnea   . Thyroid disease   . TIA (transient ischemic attack) 05/26/2017  . 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 CHOLECYSTECTOMY    . DILATION AND CURETTAGE OF UTERUS    . HERNIA REPAIR      Family History  Problem Relation Age of Onset  . Diabetes Mother   . Hypertension Mother   . Heart attack Maternal Grandmother   . Stroke Maternal Grandmother   . Diabetes Maternal  Grandfather   . Cancer Maternal Grandfather     Social History   Tobacco Use  . Smoking status: Never Smoker  . Smokeless tobacco: Never Used  Substance Use Topics  . Alcohol use: No  . Drug use: Not Currently    Review of Systems  Constitutional: Positive for malaise/fatigue. Negative for fever.  HENT: Positive for sore throat (mild, intermittent). Negative for congestion, ear pain and sinus pain.        Sneezing  Eyes: Negative for discharge and redness.  Respiratory: Positive for cough (occasional). Negative for hemoptysis, shortness of breath and wheezing.   Cardiovascular: Negative for chest pain.  Gastrointestinal: Negative for abdominal pain, diarrhea, nausea and vomiting.  Genitourinary: Negative for dysuria, flank pain and hematuria.  Musculoskeletal: Negative for myalgias.  Skin: Negative for rash.  Neurological: Negative for dizziness, weakness and headaches.  Psychiatric/Behavioral: Negative for depression and substance abuse.     Objective:   Vitals: BP 131/87 (BP Location: Right Arm)   Pulse 97   Temp 98.3 F (36.8 C) (Oral)   Resp 17   SpO2 98%   Physical Exam Constitutional:      General: She is not in acute distress.    Appearance: Normal appearance. She is well-developed. She is not ill-appearing, toxic-appearing or diaphoretic.  HENT:     Head: Normocephalic and atraumatic.     Nose: Nose normal.     Mouth/Throat:     Mouth: Mucous membranes are moist.     Pharynx: Oropharynx is clear.  Eyes:     General: No scleral icterus.       Right eye: No discharge.        Left eye: No discharge.     Extraocular Movements: Extraocular movements intact.     Pupils: Pupils are equal, round, and reactive to light.  Cardiovascular:     Rate and Rhythm: Normal rate and regular rhythm.     Pulses: Normal pulses.     Heart sounds: Normal heart sounds. No murmur. No friction rub. No gallop.   Pulmonary:     Effort: Pulmonary effort is normal. No  respiratory distress.     Breath sounds: Normal breath sounds. No stridor. No wheezing, rhonchi or rales.  Skin:    General: Skin is warm and dry.     Findings: No rash.  Neurological:     Mental Status: She is alert and oriented to person, place, and time.  Psychiatric:        Mood and Affect: Mood normal.        Behavior: Behavior normal.        Thought Content: Thought content normal.        Judgment: Judgment normal.      Assessment and Plan :  1. Viral illness   2. Sneezing   3. Type 2 diabetes mellitus without complication, with long-term current use of insulin (HCC)   4. Uncontrolled type 2 diabetes mellitus with hyperglycemia (HCC)     Will manage for viral illness such as viral URI, viral rhinitis, possible COVID-19. Counseled patient on nature of COVID-19 including modes of transmission, diagnostic testing, management and supportive care.  Offered symptomatic relief. COVID 19 testing is pending. Counseled patient on potential for adverse effects with medications prescribed/recommended today, ER and return-to-clinic precautions discussed, patient verbalized understanding.     Wallis Bamberg, PA-C 08/15/19 1140

## 2019-08-15 NOTE — Discharge Instructions (Addendum)

## 2019-08-15 NOTE — ED Triage Notes (Signed)
Pt presents with slightly sore throat, post nasal drainage, and fatigue since yesterday.

## 2019-08-18 LAB — NOVEL CORONAVIRUS, NAA (HOSP ORDER, SEND-OUT TO REF LAB; TAT 18-24 HRS): SARS-CoV-2, NAA: NOT DETECTED

## 2019-08-22 ENCOUNTER — Encounter: Payer: Self-pay | Admitting: Nurse Practitioner

## 2019-08-22 ENCOUNTER — Ambulatory Visit: Payer: Medicaid Other | Attending: Nurse Practitioner | Admitting: Nurse Practitioner

## 2019-08-22 ENCOUNTER — Other Ambulatory Visit: Payer: Self-pay

## 2019-08-22 VITALS — Ht 64.0 in | Wt 224.0 lb

## 2019-08-22 DIAGNOSIS — G8929 Other chronic pain: Secondary | ICD-10-CM

## 2019-08-22 DIAGNOSIS — G4730 Sleep apnea, unspecified: Secondary | ICD-10-CM | POA: Insufficient documentation

## 2019-08-22 DIAGNOSIS — Z8249 Family history of ischemic heart disease and other diseases of the circulatory system: Secondary | ICD-10-CM | POA: Insufficient documentation

## 2019-08-22 DIAGNOSIS — Z8673 Personal history of transient ischemic attack (TIA), and cerebral infarction without residual deficits: Secondary | ICD-10-CM | POA: Insufficient documentation

## 2019-08-22 DIAGNOSIS — Z79899 Other long term (current) drug therapy: Secondary | ICD-10-CM | POA: Insufficient documentation

## 2019-08-22 DIAGNOSIS — M542 Cervicalgia: Secondary | ICD-10-CM | POA: Diagnosis not present

## 2019-08-22 DIAGNOSIS — E079 Disorder of thyroid, unspecified: Secondary | ICD-10-CM | POA: Diagnosis not present

## 2019-08-22 DIAGNOSIS — M25559 Pain in unspecified hip: Secondary | ICD-10-CM | POA: Diagnosis not present

## 2019-08-22 DIAGNOSIS — Z833 Family history of diabetes mellitus: Secondary | ICD-10-CM | POA: Diagnosis not present

## 2019-08-22 DIAGNOSIS — M5442 Lumbago with sciatica, left side: Secondary | ICD-10-CM | POA: Diagnosis not present

## 2019-08-22 DIAGNOSIS — J45909 Unspecified asthma, uncomplicated: Secondary | ICD-10-CM | POA: Diagnosis not present

## 2019-08-22 DIAGNOSIS — M25519 Pain in unspecified shoulder: Secondary | ICD-10-CM | POA: Diagnosis not present

## 2019-08-22 DIAGNOSIS — E119 Type 2 diabetes mellitus without complications: Secondary | ICD-10-CM | POA: Insufficient documentation

## 2019-08-22 MED ORDER — DULOXETINE HCL 40 MG PO CPEP: 40.0000 mg | ORAL_CAPSULE | Freq: Every day | ORAL | 1 refills | Status: DC

## 2019-08-22 MED ORDER — ALBUTEROL SULFATE HFA 108 (90 BASE) MCG/ACT IN AERS: 1.0000 | INHALATION_SPRAY | Freq: Four times a day (QID) | RESPIRATORY_TRACT | 1 refills | Status: DC | PRN

## 2019-08-22 MED ORDER — CYCLOBENZAPRINE HCL 10 MG PO TABS: 10.0000 mg | ORAL_TABLET | Freq: Every day | ORAL | 0 refills | Status: DC

## 2019-08-22 NOTE — Progress Notes (Signed)
Virtual Visit via Telephone Note Due to national recommendations of social distancing due to [place] 19, telehealth visit is felt to be most appropriate for this patient at this time.  I discussed the limitations, risks, security and privacy concerns of performing an evaluation and management service by telephone and the availability of in person appointments. I also discussed with the patient that there may be a patient responsible charge related to this service. The patient expressed understanding and agreed to proceed.    I connected with Lindsey Bowen on 08/22/19  at   9:10 AM EST  EDT by telephone and verified that I am speaking with the correct person using two identifiers.   Consent I discussed the limitations, risks, security and privacy concerns of performing an evaluation and management service by telephone and the availability of in person appointments. I also discussed with the patient that there may be a patient responsible charge related to this service. The patient expressed understanding and agreed to proceed.   Location of Patient: Private  Residence    Location of Provider: [place] and [place] participating in Telemedicine visit: Geryl Rankins FNP-BC [place]    History of Present Illness: Telemedicine visit for: Low Back pain with left sided sciatica  Chronic neck shoulder, back, hip and knee pain along with spasms.  She also has a history of a childhood neck injury and has experienced recurrent cervical pain and paresthesias radiating down to her right arm in the past. She was evaluated by [facility] last year and diagnosed with myofascial pain and spondylosis of cervical and lumbar spine.  Previous medications: prednisone; flexeril, gabapentin Prior therapies: chiropractic manipulation, massage therapy, acupuncture and physical therapy for core strengthening exercises.  Today she reports  radiating low back to left hip pain and burning sensation. Aggravating factors: Prolonged lying, standing, sitting and walking.      Past Medical History:  Diagnosis Date  . Anemia   . Diabetes mellitus type 2 with neurological manifestations ([place]) 05/27/2017  . Diabetes mellitus without complication ([place])   . Edema   . Lumbar pain   . Onychodystrophy   . Onychomycosis   . Paresthesia 05/17/2017  . Sleep apnea   . Thyroid disease   . TIA (transient ischemic attack) 05/26/2017  . Type 2 diabetes mellitus with microalbuminuria ([place]) 05/27/2017    Past Surgical History:  Procedure Laterality Date  . CHOLECYSTECTOMY    . DILATION AND CURETTAGE OF UTERUS    . HERNIA REPAIR      Family History  Problem Relation Age of Onset  . Diabetes Mother   . Hypertension Mother   . Heart attack Maternal Grandmother   . Stroke Maternal Grandmother   . Diabetes Maternal Grandfather   . Cancer Maternal Grandfather     Social History   Socioeconomic History  . Marital status: Single    Spouse name: Not on file  . Number of children: 3  . Years of education: Not on file  . Highest education level: Not on file  Occupational History  . Not on file  Tobacco Use  . Smoking status: Never Smoker  . Smokeless tobacco: Never Used  Substance and Sexual Activity  . Alcohol use: No  . Drug use: Not Currently  . Sexual activity: Not Currently    Birth control/protection: I.U.D.  Other Topics Concern  . Not on file  Social History Narrative   Lives    Caffeine  use:    Social Determinants of Health   Financial Resource Strain:   . Difficulty of Paying Living Expenses: Not on file  Food Insecurity:   . Worried About Programme researcher, broadcasting/film/video in the Last Year: Not on file  . Ran Out of Food in the Last Year: Not on file  Transportation Needs:   . Lack of Transportation (Medical): Not on file  . Lack of Transportation (Non-Medical): Not on file  Physical Activity:   . Days of Exercise per  Week: Not on file  . Minutes of Exercise per Session: Not on file  Stress:   . Feeling of Stress : Not on file  Social Connections:   . Frequency of Communication with Friends and Family: Not on file  . Frequency of Social Gatherings with Friends and Family: Not on file  . Attends Religious Services: Not on file  . Active Member of Clubs or Organizations: Not on file  . Attends Banker Meetings: Not on file  . Marital Status: Not on file     Observations/Objective: Awake, alert and oriented x 3   Review of Systems  Constitutional: Negative for fever, malaise/fatigue and weight loss.  HENT: Negative.  Negative for nosebleeds.   Eyes: Negative.  Negative for blurred vision, double vision and photophobia.  Respiratory: Negative.  Negative for cough and shortness of breath.   Cardiovascular: Negative.  Negative for chest pain, palpitations and leg swelling.  Gastrointestinal: Negative.  Negative for heartburn, nausea and vomiting.  Genitourinary:       No bowel or bladder incontinence  Musculoskeletal: Positive for back pain and myalgias.  Neurological: Positive for sensory change. Negative for dizziness, focal weakness, seizures and headaches.  Psychiatric/Behavioral: Negative.  Negative for suicidal ideas.    Assessment and Plan: [place] was seen today for back pain.  Diagnoses and all orders for this visit:  Chronic bilateral low back pain with left-sided sciatica -     DG Lumbar Spine Complete; Future -     Ambulatory referral to Orthopedic Surgery -     DULoxetine 40 MG CPEP; Take 40 mg by mouth daily. -     cyclobenzaprine (FLEXERIL) 10 MG tablet; Take 1 tablet (10 mg total) by mouth at bedtime.   Controlled Asthma -     albuterol (VENTOLIN HFA) 108 (90 Base) MCG/ACT inhaler; Inhale 1-2 puffs into the lungs every 6 (six) hours as needed for wheezing or shortness of breath.    Follow Up Instructions Return in about 4 weeks (around 09/19/2019).     I  discussed the assessment and treatment plan with the patient. The patient was provided an opportunity to ask questions and all were answered. The patient agreed with the plan and demonstrated an understanding of the instructions.   The patient was advised to call back or seek an in-person evaluation if the symptoms worsen or if the condition fails to improve as anticipated.  I provided 19 minutes of non-face-to-face time during this encounter including median intraservice time, reviewing previous notes, labs, imaging, medications and explaining diagnosis and management.  Claiborne Rigg, FNP-BC

## 2019-08-27 ENCOUNTER — Encounter: Payer: Self-pay | Admitting: Family Medicine

## 2019-08-27 ENCOUNTER — Ambulatory Visit (INDEPENDENT_AMBULATORY_CARE_PROVIDER_SITE_OTHER): Payer: Medicaid Other | Admitting: Family Medicine

## 2019-08-27 ENCOUNTER — Other Ambulatory Visit: Payer: Self-pay

## 2019-08-27 ENCOUNTER — Ambulatory Visit: Payer: Self-pay

## 2019-08-27 DIAGNOSIS — M545 Low back pain, unspecified: Secondary | ICD-10-CM

## 2019-08-27 MED ORDER — GABAPENTIN 100 MG PO CAPS: 100.0000 mg | ORAL_CAPSULE | Freq: Three times a day (TID) | ORAL | 3 refills | Status: DC

## 2019-08-27 NOTE — Progress Notes (Signed)
Lindsey Bowen - 49 y.o. female MRN [id number]  Date of birth: 10-16-1970  Office Visit Note: Visit Date: 08/27/2019 PCP: Gildardo Pounds, NP Referred by: Gildardo Pounds, NP  Subjective: Chief Complaint  Patient presents with  . 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  Gets thightness, pain acorss the low back and into the left hip. And has started noticing it moving towards the right hip some.   HPI: Lindsey Bowen is a 49 y.o. female who comes in today with chronic low back pain.  She reports that she has had low back pain since 2008 when she worked for the [employer] doing a lot of strenuous lifting. Pain has been intermittent since this time. Last year, she did PT for back pain which helped significantly. Pain has recently flared up as she has started walking for exercise. She has sciatica down both legs. She feels pain most when standing or walking. Occasional weakness in her right leg as well. She is currently taking flexeril which helps with tightness but makes her drowsy.  She has poorly controlled diabetes. 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 She used to take 100 mg TID.   ROS Otherwise per HPI.  Assessment & Plan: Visit Diagnoses:  1. Low back pain, unspecified back pain laterality, unspecified chronicity, unspecified whether sciatica present     Plan: Chronic low back pain with bilateral sciatica, with evidence of severe lordosis on x-ray. Will refer to PT as this has improved pain in the past. Will also trial gabapentin for neuropathic pain. If no improvement in 3-4 weeks, will obtain MRI.   Meds & Orders:  Meds ordered this encounter  Medications  . gabapentin (NEURONTIN) 100 MG capsule    Sig: Take 1 capsule (100 mg total) by mouth 3 (three) times daily.    Dispense:  90 capsule    Refill:  3    Orders Placed This Encounter  Procedures  . XR Lumbar  Spine 2-3 Views  . Ambulatory referral to Physical Therapy    Follow-up: No follow-ups on file.   Procedures: No procedures performed  No notes on file   Clinical History: No specialty comments available.   She reports that she has never smoked. She has never used smokeless tobacco. No results for input(s): HGBA1C, LABURIC in the last 8760 hours.  Objective:  VS:  HT:    WT:   BMI:     BP:   HR: bpm  TEMP: ( )  RESP:  Physical Exam  PHYSICAL EXAM: Gen: NAD, alert, cooperative with exam, well-appearing HEENT: clear conjunctiva,  CV:  no edema, capillary refill brisk, normal rate Resp: non-labored Skin: no rashes, normal turgor  Neuro: no gross deficits.  Psych:  alert and oriented  Ortho Exam  Lumbar spine: - Inspection: no gross deformity or asymmetry, swelling or ecchymosis TTP over L4-S1 spinous processes, tight paraspinal muscles. No TTP over SI joints - ROM: full active ROM of the lumbar spine in flexion and extension without pain - Strength: 5/5 strength of lower extremity in L4-S1 nerve root distributions b/l; normal gait - Neuro: sensation intact in the L4-S1 nerve root distribution b/l, 1+ L4 and S1 reflexes - Special testing: positive straight leg raise,   Imaging: Lumbar spine- severe lumbar lordosis  Past Medical/Family/Surgical/Social History: Medications & Allergies reviewed per EMR, new medications updated. Patient Active Problem List  Diagnosis Date Noted  . Plantar fasciitis 02/16/2019  . History of TIA (transient ischemic attack) 06/29/2017  . Slurred speech 06/28/2017  . 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 Essential hypertension 05/27/2017  . Hyperlipidemia LDL goal <70 05/27/2017  . Hypothyroidism 05/27/2017  . TIA (transient ischemic attack) 05/26/2017  . Paresthesia 05/17/2017  . Iron deficiency 04/19/2016  . Sleep apnea  04/19/2016  . Chronic bilateral low back pain with left-sided sciatica 02/13/2016  . Onychodystrophy 08/26/2015  . Onychomycosis 08/26/2015   Past Medical History:  Diagnosis Date  . Anemia   . 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 Diabetes mellitus without complication (HCC)   . Edema   . Lumbar pain   . Onychodystrophy   . Onychomycosis   . Paresthesia 05/17/2017  . Sleep apnea   . Thyroid disease   . TIA (transient ischemic attack) 05/26/2017  . 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   Family History  Problem Relation Age of Onset  . Diabetes Mother   . Hypertension Mother   . Heart attack Maternal Grandmother   . Stroke Maternal Grandmother   . Diabetes Maternal Grandfather   . Cancer Maternal Grandfather    Past Surgical History:  Procedure Laterality Date  . CHOLECYSTECTOMY    . DILATION AND CURETTAGE OF UTERUS    . HERNIA REPAIR     Social History   Occupational History  . Not on file  Tobacco Use  . Smoking status: Never Smoker  . Smokeless tobacco: Never Used  Substance and Sexual Activity  . Alcohol use: No  . Drug use: Not Currently  . Sexual activity: Not Currently    Birth control/protection: I.U.D.

## 2019-08-27 NOTE — Progress Notes (Signed)
I saw and examined the patient with Dr. Robby Sermon and agree with assessment and plan as outlined.    Chronic intermittent LBP with occasional left-sided sciatica.  X-Rays show exaggerated lumbar lordosis, which could affect facet joints when standing/walking.  Will try gabapentin, PT.  Consider MRI if fails to improve.

## 2019-08-30 DIAGNOSIS — R809 Proteinuria, unspecified: Secondary | ICD-10-CM | POA: Diagnosis not present

## 2019-08-30 DIAGNOSIS — E039 Hypothyroidism, unspecified: Secondary | ICD-10-CM | POA: Diagnosis not present

## 2019-08-30 DIAGNOSIS — E785 Hyperlipidemia, unspecified: Secondary | ICD-10-CM | POA: Diagnosis not present

## 2019-08-30 DIAGNOSIS — Z794 Long term (current) use of insulin: Secondary | ICD-10-CM | POA: Diagnosis not present

## 2019-08-30 DIAGNOSIS — E1129 Type 2 diabetes mellitus with other diabetic kidney complication: Secondary | ICD-10-CM | POA: Diagnosis not present

## 2019-08-30 DIAGNOSIS — E1169 Type 2 diabetes mellitus with other specified complication: Secondary | ICD-10-CM | POA: Diagnosis not present

## 2019-08-30 DIAGNOSIS — E1165 Type 2 diabetes mellitus with hyperglycemia: Secondary | ICD-10-CM | POA: Diagnosis not present

## 2019-08-31 ENCOUNTER — Other Ambulatory Visit: Payer: Medicaid Other

## 2019-09-02 DIAGNOSIS — Z20822 Contact with and (suspected) exposure to covid-19: Secondary | ICD-10-CM | POA: Diagnosis not present

## 2019-09-10 ENCOUNTER — Ambulatory Visit: Payer: Medicaid Other | Admitting: Nurse Practitioner

## 2019-09-11 ENCOUNTER — Ambulatory Visit: Payer: Medicaid Other | Attending: Podiatry | Admitting: Physical Therapy

## 2019-09-11 ENCOUNTER — Encounter: Payer: Self-pay | Admitting: Physical Therapy

## 2019-09-11 ENCOUNTER — Other Ambulatory Visit: Payer: Self-pay

## 2019-09-11 DIAGNOSIS — M545 Low back pain, unspecified: Secondary | ICD-10-CM

## 2019-09-11 DIAGNOSIS — M62830 Muscle spasm of back: Secondary | ICD-10-CM | POA: Diagnosis not present

## 2019-09-12 ENCOUNTER — Encounter: Payer: Self-pay | Admitting: Physical Therapy

## 2019-09-12 NOTE — Therapy (Signed)
[facility] Outpatient Rehabilitation [facility] [address] [place], [place], [address] Phone: [phone]   Fax:  [phone]  Physical Therapy Evaluation  Patient Details  Name: Lindsey Bowen MRN: [id number] Date of Birth: 1970/10/04 Referring Provider (PT): Dr Lavada Mesi    Encounter Date: 09/11/2019  PT End of Session - 09/11/19 1154    Visit Number  1    Number of Visits  4    Date for PT Re-Evaluation  10/10/19    PT Start Time  1145    PT Stop Time  1229    PT Time Calculation (min)  44 min    Activity Tolerance  Patient tolerated treatment well    Behavior During Therapy  [facility] for tasks assessed/performed       Past Medical History:  Diagnosis Date  . Anemia   . 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 Diabetes mellitus without complication (HCC)   . Edema   . Lumbar pain   . Onychodystrophy   . Onychomycosis   . Paresthesia 05/17/2017  . Sleep apnea   . Thyroid disease   . TIA (transient ischemic attack) 05/26/2017  . 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 CHOLECYSTECTOMY    . DILATION AND CURETTAGE OF UTERUS    . HERNIA REPAIR      There were no vitals filed for this visit.   Subjective Assessment - 09/11/19 1150    Subjective  Patient had an incidious onset of lower back pain about a month ago. She started walking more for exercise. She has increased pain when she stands, cooks, and cleans. The pain is worse of the left but is increasing on the right. She is having pain radiating into her bilateral hips.    Limitations  Standing;Writing;Walking    How long can you stand comfortably?  has difficulty standing to cook    How long can you walk comfortably?  depends    Diagnostic tests  Nothing    Patient Stated Goals  to have less pain    Pain Score  8     Pain Location  Back    Pain Orientation  Right;Left   L>R    Pain Descriptors / Indicators  Aching    Pain Type  Chronic pain    Pain Onset  More than a month ago    Pain Frequency  Constant    Aggravating Factors   standing and walking    Pain Relieving Factors  rest    Effect of Pain on Daily Activities  difficulty perfroming ADL's    Multiple Pain Sites  No         [facility] PT Assessment - 09/12/19 0001      Assessment   Medical Diagnosis  Low Back Pain     Referring Provider (PT)  Dr Lavada Mesi     Next MD Visit  Nothing scheduled at this time     Prior Therapy  Had therapy for her feet in 2020 and for her lower back in 2020      Precautions   Precautions  None      Restrictions   Weight Bearing Restrictions  No      Balance Screen   Has the patient fallen in the past 6 months  No    Has the patient had a decrease in activity level because of a fear of falling?  No    Is the patient reluctant to leave their home because of a fear of falling?   No      Home Public house manager residence      Prior Function   Level of Independence  Independent    Vocation  Unemployed    Leisure  walking       Cognition   Overall Cognitive Status  Within Functional Limits for tasks assessed    Attention  Focused      Observation/Other Assessments   Focus on Therapeutic Outcomes (FOTO)   Mediciad       Sensation   Light Touch  Appears Intact    Additional Comments  deines parathesias       Coordination   Gross Motor Movements are Fluid and Coordinated  Yes    Fine Motor Movements are Fluid and Coordinated  Yes      ROM / Strength   AROM / PROM / Strength  AROM;PROM;Strength      AROM   Lumbar Flexion  53    Lumbar Extension  20    Lumbar - Right Side Bend  pain     Lumbar - Left Side Bend  no pain     Lumbar - Right Rotation  pain     Lumbar - Left Rotation  no pain       PROM   Overall PROM Comments  full passive ROM of the ankle                 Objective measurements completed on  examination: See above findings.      [facility] Adult PT Treatment/Exercise - 09/12/19 0001      Exercises   Exercises  Lumbar      Lumbar Exercises: Stretches   Piriformis Stretch Limitations  3x20 sec hold       Lumbar Exercises: Standing   Other Standing Lumbar Exercises  tennis ball trigger point release       Lumbar Exercises: Supine   Other Supine Lumbar Exercises  posterior pelvic tilt x10 with cuing              PT Education - 09/11/19 1154    Education Details  HEP and symptom mangement    Person(s) Educated  Patient    Methods  Explanation;Demonstration;Tactile cues;Verbal cues    Comprehension  Verbalized understanding;Returned demonstration;Verbal cues required;Tactile cues required       PT Short Term Goals - 09/11/19 1339      PT SHORT TERM GOAL #1   Title  Patient will demonstrate full lumbar flexion without pain    Baseline  50 degrees with pain    Time  3    Period  Weeks    Status  New    Target Date  10/02/19      PT SHORT TERM GOAL #2   Title  Patient will report no radiating pain into her hips and <3/10 pain in her lower back    Baseline  8/10 with pain radiating into both hips    Time  3    Period  Weeks    Status  Achieved    Target Date  04/27/19      PT SHORT TERM GOAL #3   Title  Patient will be independent with basic stretching, strenghtening and self soft tissue mobilization program    Baseline  needs review of her exercise program    Time  3  Period  Weeks    Status  On-going    Target Date  04/27/19        PT Long Term Goals - 05/31/19 1204      PT LONG TERM GOAL #1   Title  Patient will stand for 1 hour without increased pain in order to perfrom ADL's    Baseline  can stand about an hour. More pain in the morning    Time  6    Period  Weeks    Status  Achieved      PT LONG TERM GOAL #2   Title  Patient will go up and down 12 steps without pain in order to get into her house.    Baseline  mild pain at times but  doing mcu better    Time  6    Period  Weeks    Status  Achieved      PT LONG TERM GOAL #3   Title  Pt will be able to stand as needed for home, cooking, washing dishes with pain no more than 3/10.     Baseline  1 hour then has to sit or stretch    Time  6    Period  Weeks    Status  Achieved             Plan - 09/11/19 1325    Clinical Impression Statement  Patient presents with acute incidious onset of low back pain. The pain radiates into both hips. She has a long hisotry of lower back pain. She has had success with PT in the past. She tried to use her stretches this time but was unable to get her pain level down. She has limited lumbar flexion and significant tightness in her glutels and lumbar paraspinals. The patient has a hyperlordosis. She would benefit from corre strnegthneing and manual therapy to decrease spasming.    Personal Factors and Comorbidities  Comorbidity 1;Comorbidity 2;Comorbidity 3+    Comorbidities  obesity, diabetes, low back pain,    Examination-Activity Limitations  Locomotion Level;Stairs;Lift;Squat    Examination-Participation Restrictions  Community Activity;Meal Prep;Yard Work;Laundry;Shop    Stability/Clinical Decision Making  Evolving/Moderate complexity   increasing pain over the past month   Clinical Decision Making  Moderate    Rehab Potential  Good    PT Frequency  2x / week    PT Duration  6 weeks    PT Treatment/Interventions  ADLs/Self Care Home Management;Therapeutic activities;Therapeutic exercise;Electrical Stimulation;Stair training;Gait training;Patient/family education;Manual techniques;Taping;Moist Heat;Cryotherapy;Ultrasound;Traction;Neuromuscular re-education;Manual lymph drainage;Dry needling;Passive range of motion    PT Next Visit Plan  soft tissue mobilization to the lumbar spine; most of the focus needs to be on core strength but the patient may need some manua therapy to decrease pain first. next visit continue supine core  stability add marching ; consider bride; continue PPT; consider clamshell with PPT; consdier any seated or standing core stability; modlaities PRN    PT Home Exercise Plan  piriformis stretch; ppt , tennis ball trigger point release       Patient will benefit from skilled therapeutic intervention in order to improve the following deficits and impairments:  Abnormal gait, Decreased mobility, Decreased activity tolerance, Decreased strength, Pain, Obesity, Decreased range of motion, Difficulty walking, Improper body mechanics, Postural dysfunction, Increased muscle spasms  Visit Diagnosis: Acute bilateral low back pain without sciatica  Muscle spasm of back     Problem List Patient Active Problem List   Diagnosis Date Noted  . Plantar fasciitis  02/16/2019  . History of TIA (transient ischemic attack) 06/29/2017  . Slurred speech 06/28/2017  . Type 2 diabetes mellitus with microalbuminuria ([place]) 05/27/2017  . Diabetes mellitus type 2 with neurological manifestations ([place]) 05/27/2017  . Essential hypertension 05/27/2017  . Hyperlipidemia LDL goal <70 05/27/2017  . Hypothyroidism 05/27/2017  . TIA (transient ischemic attack) 05/26/2017  . Paresthesia 05/17/2017  . Iron deficiency 04/19/2016  . Sleep apnea 04/19/2016  . Chronic bilateral low back pain with left-sided sciatica 02/13/2016  . Onychodystrophy 08/26/2015  . Onychomycosis 08/26/2015    Carney Living 09/12/2019, 2:15 PM  [facility] [address] [place], [place], [address] Phone: [phone]   Fax:  [phone]  Name: Shacara Cozine MRN: [id number] Date of Birth: 13-Feb-1971

## 2019-09-20 DIAGNOSIS — E119 Type 2 diabetes mellitus without complications: Secondary | ICD-10-CM | POA: Diagnosis not present

## 2019-09-20 DIAGNOSIS — H25013 Cortical age-related cataract, bilateral: Secondary | ICD-10-CM | POA: Diagnosis not present

## 2019-09-20 DIAGNOSIS — H40052 Ocular hypertension, left eye: Secondary | ICD-10-CM | POA: Diagnosis not present

## 2019-09-20 DIAGNOSIS — H40013 Open angle with borderline findings, low risk, bilateral: Secondary | ICD-10-CM | POA: Diagnosis not present

## 2019-09-25 ENCOUNTER — Encounter: Payer: Self-pay | Admitting: Physical Therapy

## 2019-09-25 ENCOUNTER — Other Ambulatory Visit: Payer: Self-pay

## 2019-09-25 ENCOUNTER — Ambulatory Visit: Payer: Medicaid Other | Admitting: Physical Therapy

## 2019-09-25 DIAGNOSIS — M545 Low back pain, unspecified: Secondary | ICD-10-CM

## 2019-09-25 DIAGNOSIS — M62830 Muscle spasm of back: Secondary | ICD-10-CM | POA: Diagnosis not present

## 2019-09-25 NOTE — Therapy (Signed)
[facility] Outpatient Rehabilitation [facility] [address] [place], [place], [address] Phone: [phone]   Fax:  [phone]  Physical Therapy Treatment  Patient Details  Name: Lindsey Bowen MRN: [id number] Date of Birth: Nov 04, 1970 Referring Provider (PT): Dr Lavada Mesi    Encounter Date: 09/25/2019  PT End of Session - 09/25/19 1405    Visit Number  2    Number of Visits  4    Date for PT Re-Evaluation  10/10/19    PT Start Time  1340   Patient 10 minutes late   PT Stop Time  1415    PT Time Calculation (min)  35 min    Activity Tolerance  Patient tolerated treatment well    Behavior During Therapy  [facility] for tasks assessed/performed       Past Medical History:  Diagnosis Date  . Anemia   . 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 Diabetes mellitus without complication (HCC)   . Edema   . Lumbar pain   . Onychodystrophy   . Onychomycosis   . Paresthesia 05/17/2017  . Sleep apnea   . Thyroid disease   . TIA (transient ischemic attack) 05/26/2017  . 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 CHOLECYSTECTOMY    . DILATION AND CURETTAGE OF UTERUS    . HERNIA REPAIR      There were no vitals filed for this visit.  Subjective Assessment - 09/25/19 1357    Subjective  Patien reports she has been working on her exercises but had a spasm in her back last night. she has been driving a lot which has put her in some pain.    Limitations  Standing;Writing;Walking    How long can you stand comfortably?  has difficulty standing to cook    How long can you walk comfortably?  depends    Diagnostic tests  Nothing    Patient Stated Goals  to have less pain    Currently in Pain?  Yes    Pain Score  6     Pain Location  Back    Pain Orientation  Left    Pain Descriptors / Indicators  Aching    Pain Type  Chronic pain    Pain Onset  More  than a month ago    Pain Frequency  Constant    Aggravating Factors   standing and walking    Pain Relieving Factors  rest    Effect of Pain on Daily Activities  difficulty perfroking ADL's    Multiple Pain Sites  No                               PT Education - 09/25/19 1401    Education Details  HEP and symptom magement    Person(s) Educated  Patient    Methods  Explanation;Demonstration;Tactile cues;Verbal cues    Comprehension  Verbalized understanding;Returned demonstration;Verbal cues required;Need further instruction;Tactile cues required       PT Short Term Goals - 09/25/19 1410      PT SHORT TERM GOAL #1   Title  Patient will demonstrate full lumbar flexion without pain    Baseline  50 degrees with pain    Time  3    Period  Weeks    Status  New    Target Date  10/02/19  PT SHORT TERM GOAL #2   Title  Patient will report no radiating pain into her hips and <3/10 pain in her lower back    Baseline  8/10 with pain radiating into both hips    Time  3    Period  Weeks    Status  Achieved    Target Date  04/27/19      PT SHORT TERM GOAL #3   Title  Patient will be independent with basic stretching, strenghtening and self soft tissue mobilization program    Baseline  needs review of her exercise program    Time  3    Period  Weeks    Target Date  04/27/19        PT Long Term Goals - 05/31/19 1204      PT LONG TERM GOAL #1   Title  Patient will stand for 1 hour without increased pain in order to perfrom ADL's    Baseline  can stand about an hour. More pain in the morning    Time  6    Period  Weeks    Status  Achieved      PT LONG TERM GOAL #2   Title  Patient will go up and down 12 steps without pain in order to get into her house.    Baseline  mild pain at times but doing mcu better    Time  6    Period  Weeks    Status  Achieved      PT LONG TERM GOAL #3   Title  Pt will be able to stand as needed for home, cooking, washing  dishes with pain no more than 3/10.     Baseline  1 hour then has to sit or stretch    Time  6    Period  Weeks    Status  Achieved            Plan - 09/25/19 1408    Clinical Impression Statement  Therapy added supine ther-ex with PPT for home. She tolerated well with minimal pain. She had spasming in her right upper gluteal and lumbar spine today. She reported improved pain with manual therapy. Therapy will continue to advance exercises as tolerated. Consdier addiing standing exercises next visit.    Personal Factors and Comorbidities  Comorbidity 1;Comorbidity 2;Comorbidity 3+    Comorbidities  obesity, diabetes, low back pain,    Examination-Activity Limitations  Locomotion Level;Stairs;Lift;Squat    Examination-Participation Restrictions  Community Activity;Meal Prep;Yard Work;Laundry;Shop    Stability/Clinical Decision Making  Evolving/Moderate complexity    Clinical Decision Making  Moderate    Rehab Potential  Good    PT Frequency  2x / week    PT Duration  6 weeks    PT Treatment/Interventions  ADLs/Self Care Home Management;Therapeutic activities;Therapeutic exercise;Electrical Stimulation;Stair training;Gait training;Patient/family education;Manual techniques;Taping;Moist Heat;Cryotherapy;Ultrasound;Traction;Neuromuscular re-education;Manual lymph drainage;Dry needling;Passive range of motion    PT Next Visit Plan  soft tissue mobilization to the lumbar spine; most of the focus needs to be on core strength but the patient may need some manua therapy to decrease pain first. next visit continue supine core stability add marching ; consider bride; continue PPT; consider clamshell with PPT; consdier any seated or standing core stability; modlaities PRN    PT Home Exercise Plan  piriformis stretch; ppt , tennis ball trigger point release    Consulted and Agree with Plan of Care  Patient       Patient will benefit from  skilled therapeutic intervention in order to improve the  following deficits and impairments:  Abnormal gait, Decreased mobility, Decreased activity tolerance, Decreased strength, Pain, Obesity, Decreased range of motion, Difficulty walking, Improper body mechanics, Postural dysfunction, Increased muscle spasms  Visit Diagnosis: Acute bilateral low back pain without sciatica  Muscle spasm of back     Problem List Patient Active Problem List   Diagnosis Date Noted  . Plantar fasciitis 02/16/2019  . History of TIA (transient ischemic attack) 06/29/2017  . Slurred speech 06/28/2017  . 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 Essential hypertension 05/27/2017  . Hyperlipidemia LDL goal <70 05/27/2017  . Hypothyroidism 05/27/2017  . TIA (transient ischemic attack) 05/26/2017  . Paresthesia 05/17/2017  . Iron deficiency 04/19/2016  . Sleep apnea 04/19/2016  . Chronic bilateral low back pain with left-sided sciatica 02/13/2016  . Onychodystrophy 08/26/2015  . Onychomycosis 08/26/2015    Dessie Coma PT  09/25/2019, 4:07 PM  [facility] [address] [place], [place], [address] Phone: [phone]   Fax:  [phone]  Name: Louellen Haldeman MRN: [id number] Date of Birth: April 24, 1971

## 2019-10-02 ENCOUNTER — Other Ambulatory Visit: Payer: Self-pay

## 2019-10-02 ENCOUNTER — Ambulatory Visit: Payer: Medicaid Other | Attending: Family Medicine | Admitting: Physical Therapy

## 2019-10-02 DIAGNOSIS — M545 Low back pain, unspecified: Secondary | ICD-10-CM

## 2019-10-02 DIAGNOSIS — M62830 Muscle spasm of back: Secondary | ICD-10-CM | POA: Insufficient documentation

## 2019-10-02 NOTE — Therapy (Signed)
[facility] [address] [place], [place], [address] Phone: [phone]   Fax:  [phone]  Physical Therapy Treatment  Patient Details  Name: Lindsey Bowen MRN: [id number] Date of Birth: 05/01/1971 Referring Provider (PT): Dr Lavada Mesi    Encounter Date: 10/02/2019  PT End of Session - 10/02/19 1447    Visit Number  3    Number of Visits  4    Date for PT Re-Evaluation  10/10/19    PT Start Time  1342   Patient was 12 minutes late   PT Stop Time  1415    PT Time Calculation (min)  33 min    Activity Tolerance  Patient tolerated treatment well    Behavior During Therapy  [facility] for tasks assessed/performed       Past Medical History:  Diagnosis Date  . Anemia   . 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 Diabetes mellitus without complication (HCC)   . Edema   . Lumbar pain   . Onychodystrophy   . Onychomycosis   . Paresthesia 05/17/2017  . Sleep apnea   . Thyroid disease   . TIA (transient ischemic attack) 05/26/2017  . 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 CHOLECYSTECTOMY    . DILATION AND CURETTAGE OF UTERUS    . HERNIA REPAIR      There were no vitals filed for this visit.                    [facility] Adult PT Treatment/Exercise - 10/02/19 0001      Lumbar Exercises: Standing   Other Standing Lumbar Exercises  Standing hip abduction 2x10; Standing hip extension 2x10       Lumbar Exercises: Supine   Pelvic Tilt  2 reps;10 reps    Clam Limitations  2x10 red with abdominal breathing     Other Supine Lumbar Exercises  double knee to chest with ball 2x10     Other Supine Lumbar Exercises  heel press into ball with breating 2x10       Manual Therapy   Manual Therapy  Soft tissue mobilization;Passive ROM;Joint mobilization    Joint Mobilization  PA mobilizations to lower  lumbar spine     Soft tissue mobilization  to lumbar spine              PT Education - 10/02/19 1519    Education Details  importance of coming for full visits. Reviewed strengthening for home    Person(s) Educated  Patient    Methods  Demonstration;Tactile cues;Verbal cues;Explanation    Comprehension  Verbalized understanding;Returned demonstration;Verbal cues required;Tactile cues required;Need further instruction       PT Short Term Goals - 09/25/19 1410      PT SHORT TERM GOAL #1   Title  Patient will demonstrate full lumbar flexion without pain    Baseline  50 degrees with pain    Time  3    Period  Weeks    Status  New    Target Date  10/02/19      PT SHORT TERM GOAL #2   Title  Patient will report no radiating pain into her hips and <3/10 pain in her lower back    Baseline  8/10 with pain radiating into both hips    Time  3    Period  Weeks    Status  Achieved    Target Date  04/27/19      PT SHORT TERM GOAL #3   Title  Patient will be independent with basic stretching, strenghtening and self soft tissue mobilization program    Baseline  needs review of her exercise program    Time  3    Period  Weeks    Target Date  04/27/19        PT Long Term Goals - 05/31/19 1204      PT LONG TERM GOAL #1   Title  Patient will stand for 1 hour without increased pain in order to perfrom ADL's    Baseline  can stand about an hour. More pain in the morning    Time  6    Period  Weeks    Status  Achieved      PT LONG TERM GOAL #2   Title  Patient will go up and down 12 steps without pain in order to get into her house.    Baseline  mild pain at times but doing mcu better    Time  6    Period  Weeks    Status  Achieved      PT LONG TERM GOAL #3   Title  Pt will be able to stand as needed for home, cooking, washing dishes with pain no more than 3/10.     Baseline  1 hour then has to sit or stretch    Time  6    Period  Weeks    Status  Achieved             Plan - 10/02/19 1444    Clinical Impression Statement  Patient was limited by being late again today. She had another MD appointment which she was coming from. overall she feels like her back is about the same. She is having increased pain when she stands. She was given standing hip ther-ex today to work on at home. She reuqired min cuing for technuqe. She had a minor increae in pain with ther-ex. Sh ewas advised to be patient. Building strength takes time.    Personal Factors and Comorbidities  Comorbidity 1;Comorbidity 2;Comorbidity 3+    Comorbidities  obesity, diabetes, low back pain,    Stability/Clinical Decision Making  Evolving/Moderate complexity    Rehab Potential  Good    PT Frequency  2x / week    PT Duration  6 weeks    PT Treatment/Interventions  ADLs/Self Care Home Management;Therapeutic activities;Therapeutic exercise;Electrical Stimulation;Stair training;Gait training;Patient/family education;Manual techniques;Taping;Moist Heat;Cryotherapy;Ultrasound;Traction;Neuromuscular re-education;Manual lymph drainage;Dry needling;Passive range of motion    PT Next Visit Plan  soft tissue mobilization to the lumbar spine; most of the focus needs to be on core strength but the patient may need some manua therapy to decrease pain first. next visit continue supine core stability add marching ; consider bride; continue PPT; consider clamshell with PPT; consdier any seated or standing core stability; modlaities PRN    PT Home Exercise Plan  piriformis stretch; ppt , tennis ball trigger point release    Consulted and Agree with Plan of Care  Patient       Patient will benefit from skilled therapeutic intervention in order to improve the following deficits and impairments:  Abnormal gait, Decreased mobility, Decreased activity tolerance, Decreased strength, Pain, Obesity, Decreased range of motion, Difficulty walking, Improper body mechanics, Postural dysfunction, Increased muscle  spasms  Visit Diagnosis: Acute bilateral low back pain without sciatica  Muscle spasm of back  Problem List Patient Active Problem List   Diagnosis Date Noted  . Plantar fasciitis 02/16/2019  . History of TIA (transient ischemic attack) 06/29/2017  . Slurred speech 06/28/2017  . Type 2 diabetes mellitus with microalbuminuria ([place]) 05/27/2017  . Diabetes mellitus type 2 with neurological manifestations ([place]) 05/27/2017  . Essential hypertension 05/27/2017  . Hyperlipidemia LDL goal <70 05/27/2017  . Hypothyroidism 05/27/2017  . TIA (transient ischemic attack) 05/26/2017  . Paresthesia 05/17/2017  . Iron deficiency 04/19/2016  . Sleep apnea 04/19/2016  . Chronic bilateral low back pain with left-sided sciatica 02/13/2016  . Onychodystrophy 08/26/2015  . Onychomycosis 08/26/2015    Carney Living PT DPT  10/02/2019, 3:19 PM  [facility] [address] [place], [place], [address] Phone: [phone]   Fax:  [phone]  Name: Winry Egnew MRN: [id number] Date of Birth: [birth year]

## 2019-10-09 ENCOUNTER — Ambulatory Visit: Payer: Medicaid Other | Admitting: Physical Therapy

## 2019-10-09 ENCOUNTER — Other Ambulatory Visit: Payer: Self-pay

## 2019-10-09 ENCOUNTER — Encounter: Payer: Self-pay | Admitting: Physical Therapy

## 2019-10-09 DIAGNOSIS — M545 Low back pain, unspecified: Secondary | ICD-10-CM

## 2019-10-09 DIAGNOSIS — M62830 Muscle spasm of back: Secondary | ICD-10-CM

## 2019-10-09 NOTE — Therapy (Signed)
[facility] Outpatient Rehabilitation [facility] [address] [place], [place], [address] Phone: [phone]   Fax:  [phone]  Physical Therapy Treatment  Patient Details  Name: Lindsey Bowen MRN: [id number] Date of Birth: [birth year] Referring Provider (PT): Dr Lavada Mesi    Encounter Date: 10/09/2019  PT End of Session - 10/09/19 1523    Visit Number  4    Number of Visits  4    Date for PT Re-Evaluation  11/28/19    Authorization Type  MCD    Authorization - Visit Number  3    Authorization - Number of Visits  3    PT Start Time  1402    PT Stop Time  1447    PT Time Calculation (min)  45 min    Activity Tolerance  Patient tolerated treatment well    Behavior During Therapy  [facility] for tasks assessed/performed       Past Medical History:  Diagnosis Date  . Anemia   . 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 Diabetes mellitus without complication (HCC)   . Edema   . Lumbar pain   . Onychodystrophy   . Onychomycosis   . Paresthesia 05/17/2017  . Sleep apnea   . Thyroid disease   . TIA (transient ischemic attack) 05/26/2017  . 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 CHOLECYSTECTOMY    . DILATION AND CURETTAGE OF UTERUS    . HERNIA REPAIR      There were no vitals filed for this visit.  Subjective Assessment - 10/09/19 1402    Subjective  Same as it has been.  Radiates to hips.    Currently in Pain?  Yes    Pain Score  5     Pain Location  Back    Pain Orientation  Right;Left;Lower    Pain Descriptors / Indicators  Aching;Tightness    Pain Type  Chronic pain    Pain Onset  More than a month ago    Pain Frequency  Intermittent    Aggravating Factors   standing, walking    Pain Relieving Factors  rest, stretches    Effect of Pain on Daily Activities  limits activity          [facility] Adult PT Treatment/Exercise -  10/09/19 0001      Lumbar Exercises: Stretches   Lower Trunk Rotation Limitations  5  x 10 sec    Piriformis Stretch  3 reps;30 seconds      Lumbar Exercises: Aerobic   Nustep  LE only , used towel roll behind back for comfort , L5       Lumbar Exercises: Standing   Other Standing Lumbar Exercises  hip abd x 10, ext x 10 and high knees x 10 slow for balance       Lumbar Exercises: Supine   Bridge  10 reps    Bridge Limitations  articulating       Lumbar Exercises: Quadruped   [place]/Old Horse  10 reps    Other Quadruped Lumbar Exercises  childs pose with PPT       Manual Therapy   Joint Mobilization  PA mobilizations to lower lumbar spine     Soft tissue mobilization  decompression to lumbo sacral spine              [facility] PT Assessment - 10/09/19 0001  AROM   Lumbar Flexion  touches ankle    Lumbar Extension  limited 50% or more     Lumbar - Right Side Bend  pain     Lumbar - Left Side Bend  no pain     Lumbar - Right Rotation  pain     Lumbar - Left Rotation  no pain       Strength   Overall Strength Comments  core is 3+/5 , hips and knees 4/5 symmetrically          PT Short Term Goals - 10/09/19 1414      PT SHORT TERM GOAL #1   Title  Patient will demonstrate full lumbar flexion without pain    Status  Achieved      PT SHORT TERM GOAL #2   Title  Patient will report no radiating pain into her hips and <3/10 pain in her lower back    Status  On-going      PT SHORT TERM GOAL #3   Title  Patient will be independent with basic stretching, strenghtening and self soft tissue mobilization program    Status  On-going        PT Long Term Goals - 10/09/19 1417      PT LONG TERM GOAL #1   Title  Patient will stand for 1 hour without increased pain in order to perfrom ADL's    Baseline  depends on the day, pain usually increases after shopping, cleaning , 30 min    Status  On-going    Target Date  11/27/19      PT LONG TERM GOAL #2   Title  Patient  will go up and down 12 steps without pain in order to get into her house.    Baseline  pain increased in her back intermittently    Status  On-going    Target Date  11/27/19      PT LONG TERM GOAL #3   Title  Pt will be able to stand as needed for home, cooking, washing dishes with pain no more than 3/10.     Baseline  1 hour then has to sit or stretch    Status  On-going    Target Date  11/27/19      PT LONG TERM GOAL #4   Title  --            Plan - 10/09/19 1526    Clinical Impression Statement  Patient has improved to a degree.  She is more active, was able to walk through the zoo and manage to have pain moderate.  She took appropriate rest breaks. Able to isolate pain and tightness in quadruped to decompress and mobility stiff pelvis and spine.  Will benefit from continued PT to provide corrective exercise and grade activity.    PT Frequency  1x / week    PT Duration  6 weeks    PT Treatment/Interventions  ADLs/Self Care Home Management;Therapeutic activities;Therapeutic exercise;Electrical Stimulation;Stair training;Gait training;Patient/family education;Manual techniques;Taping;Moist Heat;Cryotherapy;Ultrasound;Traction;Neuromuscular re-education;Manual lymph drainage;Dry needling;Passive range of motion    PT Next Visit Plan  STW, try quadruped again, core focus and challenge in standing    PT Home Exercise Plan  piriformis stretch; ppt , tennis ball trigger point release, qped cat/cow to childs pose, standing hip    Consulted and Agree with Plan of Care  Patient       Patient will benefit from skilled therapeutic intervention in order to improve the following deficits and  impairments:  Abnormal gait, Decreased mobility, Decreased activity tolerance, Decreased strength, Pain, Obesity, Decreased range of motion, Difficulty walking, Improper body mechanics, Postural dysfunction, Increased muscle spasms  Visit Diagnosis: Acute bilateral low back pain without sciatica  Muscle  spasm of back     Problem List Patient Active Problem List   Diagnosis Date Noted  . Plantar fasciitis 02/16/2019  . History of TIA (transient ischemic attack) 06/29/2017  . Slurred speech 06/28/2017  . Type 2 diabetes mellitus with microalbuminuria ([place]) 05/27/2017  . Diabetes mellitus type 2 with neurological manifestations ([place]) 05/27/2017  . Essential hypertension 05/27/2017  . Hyperlipidemia LDL goal <70 05/27/2017  . Hypothyroidism 05/27/2017  . TIA (transient ischemic attack) 05/26/2017  . Paresthesia 05/17/2017  . Iron deficiency 04/19/2016  . Sleep apnea 04/19/2016  . Chronic bilateral low back pain with left-sided sciatica 02/13/2016  . Onychodystrophy 08/26/2015  . Onychomycosis 08/26/2015    Zaiyah Sottile 10/09/2019, 3:56 PM  [facility] [address] [place], [place], [address] Phone: [phone]   Fax:  [phone]  Name: Jonni Oelkers MRN: [id number] Date of Birth: 12/11/1970  Raeford Razor, PT 10/09/19 3:56 PM Phone: [phone] Fax: [phone]

## 2019-10-15 ENCOUNTER — Encounter: Payer: Self-pay | Admitting: Physical Therapy

## 2019-10-15 ENCOUNTER — Ambulatory Visit: Payer: Medicaid Other | Admitting: Physical Therapy

## 2019-10-15 ENCOUNTER — Other Ambulatory Visit: Payer: Self-pay

## 2019-10-15 DIAGNOSIS — M545 Low back pain, unspecified: Secondary | ICD-10-CM

## 2019-10-15 DIAGNOSIS — M62830 Muscle spasm of back: Secondary | ICD-10-CM

## 2019-10-15 NOTE — Therapy (Signed)
[place], [place], [address] Phone: [phone]   Fax:  [phone]  Physical Therapy Treatment  Patient Details  Name: Lindsey Bowen MRN: [id number] Date of Birth: 1971-05-29 Referring Provider (PT): Dr Eunice Blase    Encounter Date: 10/15/2019  PT End of Session - 10/15/19 1557    Visit Number  5    Number of Visits  10    Date for PT Re-Evaluation  11/28/19    Authorization Type  MCD    PT Start Time  2694    PT Stop Time  1628    PT Time Calculation (min)  41 min    Activity Tolerance  Patient tolerated treatment well    Behavior During Therapy  [facility] for tasks assessed/performed       Past Medical History:  Diagnosis Date  . Anemia   . Diabetes mellitus type 2 with neurological manifestations ([place]) 05/27/2017  . Diabetes mellitus without complication ([place])   . Edema   . Lumbar pain   . Onychodystrophy   . Onychomycosis   . Paresthesia 05/17/2017  . Sleep apnea   . Thyroid disease   . TIA (transient ischemic attack) 05/26/2017  . Type 2 diabetes mellitus with microalbuminuria ([place]) 05/27/2017    Past Surgical History:  Procedure Laterality Date  . CHOLECYSTECTOMY    . DILATION AND CURETTAGE OF UTERUS    . HERNIA REPAIR      There were no vitals filed for this visit.  Subjective Assessment - 10/15/19 1554    Subjective  Patient reports it is painful mostly when she stands. She feels like she can not stand up streight without the back hurting. She has been working on bracing her core when she is doing activity    Limitations  Standing;Writing;Walking    How long can you stand comfortably?  has difficulty standing to cook    How long can you walk comfortably?  depends    Diagnostic tests  Nothing    Patient Stated Goals  to have less pain    Currently in Pain?  No/denies   only when standing                      [facility] Adult PT Treatment/Exercise - 10/15/19 0001       Lumbar Exercises: Stretches   Lower Trunk Rotation Limitations  5  x 10 sec    Piriformis Stretch  3 reps;30 seconds      Lumbar Exercises: Aerobic   Nustep  LE only , used towel roll behind back for comfort , L5       Lumbar Exercises: Standing   Other Standing Lumbar Exercises  scpa retraction x20 red with core breathing       Lumbar Exercises: Supine   Bridge Limitations  2x10     Other Supine Lumbar Exercises  supine march 2x10    Other Supine Lumbar Exercises  supine clamshell 2x10; supine core breathing 2cx10       Manual Therapy   Manual therapy comments  LAD 3x 20 sec hold bilateral with garde III occilations    Joint Mobilization  PA mobilizations to lower lumbar spine     Soft tissue mobilization  to lumbar spine              PT Education - 10/15/19 1557    Education Details  reviewed the importance of posture    Person(s) Educated  Patient  Methods  Explanation;Demonstration;Tactile cues;Verbal cues    Comprehension  Verbalized understanding;Returned demonstration;Verbal cues required;Tactile cues required       PT Short Term Goals - 10/15/19 1653      PT SHORT TERM GOAL #1   Title  Patient will demonstrate full lumbar flexion without pain    Baseline  50 degrees with pain    Time  3    Period  Weeks    Status  On-going    Target Date  10/02/19      PT SHORT TERM GOAL #2   Title  Patient will report no radiating pain into her hips and <3/10 pain in her lower back    Baseline  8/10 with pain radiating into both hips        PT Long Term Goals - 10/09/19 1417      PT LONG TERM GOAL #1   Title  Patient will stand for 1 hour without increased pain in order to perfrom ADL's    Baseline  depends on the day, pain usually increases after shopping, cleaning , 30 min    Status  On-going    Target Date  11/27/19      PT LONG TERM GOAL #2   Title  Patient will go up and down 12 steps without pain in order to get into her house.    Baseline  pain  increased in her back intermittently    Status  On-going    Target Date  11/27/19      PT LONG TERM GOAL #3   Title  Pt will be able to stand as needed for home, cooking, washing dishes with pain no more than 3/10.     Baseline  1 hour then has to sit or stretch    Status  On-going    Target Date  11/27/19      PT LONG TERM GOAL #4   Title  --            Plan - 10/15/19 1648    Clinical Impression Statement  Therapy continues to progress ther-ex with patient. She perfromed standin exercises today. She had no significant increase in pain. Therapy advised her to continue to use her stretches at home and work on her exercises. Her pain is more intermittent now but does have its times when her pain is bad.    Personal Factors and Comorbidities  Comorbidity 1;Comorbidity 2;Comorbidity 3+    Comorbidities  obesity, diabetes, low back pain,    Examination-Activity Limitations  Locomotion Level;Stairs;Lift;Squat    PT Frequency  1x / week    PT Duration  6 weeks    PT Treatment/Interventions  ADLs/Self Care Home Management;Therapeutic activities;Therapeutic exercise;Electrical Stimulation;Stair training;Gait training;Patient/family education;Manual techniques;Taping;Moist Heat;Cryotherapy;Ultrasound;Traction;Neuromuscular re-education;Manual lymph drainage;Dry needling;Passive range of motion    PT Next Visit Plan  STW, try quadruped again, core focus and challenge in standing    PT Home Exercise Plan  piriformis stretch; ppt , tennis ball trigger point release, qped cat/cow to childs pose, standing hip       Patient will benefit from skilled therapeutic intervention in order to improve the following deficits and impairments:  Abnormal gait, Decreased mobility, Decreased activity tolerance, Decreased strength, Pain, Obesity, Decreased range of motion, Difficulty walking, Improper body mechanics, Postural dysfunction, Increased muscle spasms  Visit Diagnosis: Acute bilateral low back pain  without sciatica  Muscle spasm of back     Problem List Patient Active Problem List   Diagnosis Date Noted  .  Plantar fasciitis 02/16/2019  . History of TIA (transient ischemic attack) 06/29/2017  . Slurred speech 06/28/2017  . 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 Essential hypertension 05/27/2017  . Hyperlipidemia LDL goal <70 05/27/2017  . Hypothyroidism 05/27/2017  . TIA (transient ischemic attack) 05/26/2017  . Paresthesia 05/17/2017  . Iron deficiency 04/19/2016  . Sleep apnea 04/19/2016  . Chronic bilateral low back pain with left-sided sciatica 02/13/2016  . Onychodystrophy 08/26/2015  . Onychomycosis 08/26/2015    Dessie Coma PT DPT  10/15/2019, 4:58 PM  [facility] [address] [place], [place], [address] Phone: [phone]   Fax:  [phone]  Name: Lindsey Bowen MRN: [id number] Date of Birth: 1971-05-23

## 2019-10-18 ENCOUNTER — Other Ambulatory Visit: Payer: Self-pay | Admitting: Nurse Practitioner

## 2019-10-18 DIAGNOSIS — G8929 Other chronic pain: Secondary | ICD-10-CM

## 2019-10-18 DIAGNOSIS — M5442 Lumbago with sciatica, left side: Secondary | ICD-10-CM

## 2019-10-22 ENCOUNTER — Encounter: Payer: Medicaid Other | Admitting: Physical Therapy

## 2019-10-24 ENCOUNTER — Ambulatory Visit: Payer: Medicaid Other | Admitting: Physical Therapy

## 2019-10-24 ENCOUNTER — Emergency Department (HOSPITAL_COMMUNITY)
Admission: EM | Admit: 2019-10-24 | Discharge: 2019-10-24 | Discharge status: Against Medical Advice | Payer: Medicaid Other

## 2019-10-29 ENCOUNTER — Ambulatory Visit: Payer: Medicaid Other | Admitting: Physical Therapy

## 2019-11-05 ENCOUNTER — Encounter: Payer: Self-pay | Admitting: Physical Therapy

## 2019-11-05 ENCOUNTER — Ambulatory Visit: Payer: Medicaid Other | Attending: Family Medicine | Admitting: Physical Therapy

## 2019-11-05 ENCOUNTER — Other Ambulatory Visit: Payer: Self-pay

## 2019-11-05 DIAGNOSIS — M545 Low back pain, unspecified: Secondary | ICD-10-CM

## 2019-11-05 DIAGNOSIS — M62830 Muscle spasm of back: Secondary | ICD-10-CM | POA: Insufficient documentation

## 2019-11-05 NOTE — Therapy (Signed)
[place], [place], [address] Phone: [phone]   Fax:  [phone]  Physical Therapy Treatment  Patient Details  Name: Lindsey Bowen MRN: [id number] Date of Birth: 25-Jul-1970 Referring Provider (PT): Dr Eunice Blase    Encounter Date: 11/05/2019  PT End of Session - 11/05/19 1143    Visit Number  6    Number of Visits  10    Date for PT Re-Evaluation  11/28/19    Authorization Type  MCD    Authorization - Visit Number  1    Authorization - Number of Visits  6    PT Start Time  1130    PT Stop Time  1218    PT Time Calculation (min)  48 min    Activity Tolerance  Patient tolerated treatment well    Behavior During Therapy  [facility] for tasks assessed/performed       Past Medical History:  Diagnosis Date  . Anemia   . Diabetes mellitus type 2 with neurological manifestations ([place]) 05/27/2017  . Diabetes mellitus without complication ([place])   . Edema   . Lumbar pain   . Onychodystrophy   . Onychomycosis   . Paresthesia 05/17/2017  . Sleep apnea   . Thyroid disease   . TIA (transient ischemic attack) 05/26/2017  . Type 2 diabetes mellitus with microalbuminuria ([place]) 05/27/2017    Past Surgical History:  Procedure Laterality Date  . CHOLECYSTECTOMY    . DILATION AND CURETTAGE OF UTERUS    . HERNIA REPAIR      There were no vitals filed for this visit.     [facility] Adult PT Treatment/Exercise - 11/05/19 0001      Self-Care   Self-Care  Posture;Other Self-Care Comments    Posture  standing, alignment, abdominal wall       Lumbar Exercises: Stretches   Single Knee to Chest Stretch  2 reps;30 seconds    Single Knee to Chest Stretch Limitations  opp shoulder     Lower Trunk Rotation  3 reps    Lower Trunk Rotation Limitations  knees crossed and rotate     Pelvic Tilt  10 reps      Lumbar Exercises: Aerobic   Nustep  LE only L6 for 6 min       Lumbar Exercises: Supine   Bridge  15 reps     Bridge Limitations  articulating       Manual Therapy   Manual therapy comments  MFR Rt trunk    Ql muscle    Soft tissue mobilization  lumbar paraspinals, Rt glute medius, done in prone and Lt sidelying              PT Education - 11/05/19 1213    Education Details  core, HEP    Person(s) Educated  Patient    Methods  Explanation    Comprehension  Verbalized understanding;Returned demonstration       PT Short Term Goals - 10/15/19 1653      PT SHORT TERM GOAL #1   Title  Patient will demonstrate full lumbar flexion without pain    Baseline  50 degrees with pain    Time  3    Period  Weeks    Status  On-going    Target Date  10/02/19      PT SHORT TERM GOAL #2   Title  Patient will report no radiating pain into her hips and <3/10 pain in her lower  back    Baseline  8/10 with pain radiating into both hips        PT Long Term Goals - 10/09/19 1417      PT LONG TERM GOAL #1   Title  Patient will stand for 1 hour without increased pain in order to perfrom ADL's    Baseline  depends on the day, pain usually increases after shopping, cleaning , 30 min    Status  On-going    Target Date  11/27/19      PT LONG TERM GOAL #2   Title  Patient will go up and down 12 steps without pain in order to get into her house.    Baseline  pain increased in her back intermittently    Status  On-going    Target Date  11/27/19      PT LONG TERM GOAL #3   Title  Pt will be able to stand as needed for home, cooking, washing dishes with pain no more than 3/10.     Baseline  1 hour then has to sit or stretch    Status  On-going    Target Date  11/27/19      PT LONG TERM GOAL #4   Title  --            Plan - 11/05/19 1144    Clinical Impression Statement  Pt with increased pain due to standing yesterday, discussed core and intermittent posture checks, increasing awarness with ADLs.  Needs more core focus, limited progress thus far.  Has a membership for therapeutic massage  now. Tightness in lumbar resolved post session , declined MHP.    PT Treatment/Interventions  ADLs/Self Care Home Management;Therapeutic activities;Therapeutic exercise;Electrical Stimulation;Stair training;Gait training;Patient/family education;Manual techniques;Taping;Moist Heat;Cryotherapy;Ultrasound;Traction;Neuromuscular re-education;Manual lymph drainage;Dry needling;Passive range of motion    PT Next Visit Plan  STW, try quadruped again, core focus and challenge in standing    PT Home Exercise Plan  piriformis stretch; ppt , tennis ball trigger point release, qped cat/cow to childs pose, standing hip, bridge add clam    Consulted and Agree with Plan of Care  Patient       Patient will benefit from skilled therapeutic intervention in order to improve the following deficits and impairments:  Abnormal gait, Decreased mobility, Decreased activity tolerance, Decreased strength, Pain, Obesity, Decreased range of motion, Difficulty walking, Improper body mechanics, Postural dysfunction, Increased muscle spasms  Visit Diagnosis: Muscle spasm of back  Acute bilateral low back pain without sciatica     Problem List Patient Active Problem List   Diagnosis Date Noted  . Plantar fasciitis 02/16/2019  . History of TIA (transient ischemic attack) 06/29/2017  . Slurred speech 06/28/2017  . 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 Essential hypertension 05/27/2017  . Hyperlipidemia LDL goal <70 05/27/2017  . Hypothyroidism 05/27/2017  . TIA (transient ischemic attack) 05/26/2017  . Paresthesia 05/17/2017  . Iron deficiency 04/19/2016  . Sleep apnea 04/19/2016  . Chronic bilateral low back pain with left-sided sciatica 02/13/2016  . Onychodystrophy 08/26/2015  . Onychomycosis 08/26/2015    Marcellius Montagna 11/05/2019, 1:08 PM  [facility] [address] [place], [place], [address] Phone: [phone]   Fax:  [phone]  Name: Lindsey Bowen MRN: [id number] Date of Birth: April 21, 1971  Karie Mainland, PT 11/05/19 1:08 PM Phone: [phone] Fax: [phone]

## 2019-11-05 NOTE — Patient Instructions (Signed)
Access Code: [email or web address]: [email or web address]: 05/10/2021Prepared by: Victorino Dike PaaExercises  Pilates Bridge - 1 x daily - 7 x weekly - 2 sets - 10 reps  Butterfly Bridge - 1 x daily - 7 x weekly - 2 sets - 10 reps

## 2019-11-12 ENCOUNTER — Ambulatory Visit: Payer: Medicaid Other | Admitting: Physical Therapy

## 2019-11-12 ENCOUNTER — Encounter: Payer: Self-pay | Admitting: Physical Therapy

## 2019-11-12 DIAGNOSIS — M62830 Muscle spasm of back: Secondary | ICD-10-CM

## 2019-11-12 DIAGNOSIS — M545 Low back pain, unspecified: Secondary | ICD-10-CM

## 2019-11-12 NOTE — Therapy (Signed)
[facility] [address] [place], [place], [address] Phone: [phone]   Fax:  [phone]  Physical Therapy Treatment  Patient Details  Name: Lindsey Bowen MRN: [id number] Date of Birth: 1971-02-12 Referring Provider (PT): Dr Lavada Mesi    Encounter Date: 11/12/2019  PT End of Session - 11/12/19 1326    Visit Number  7    Number of Visits  10    Date for PT Re-Evaluation  11/28/19    Authorization Type  MCD    PT Start Time  1125   Patient 25 minutes late   PT Stop Time  1150    PT Time Calculation (min)  25 min    Activity Tolerance  Patient tolerated treatment well    Behavior During Therapy  [facility] for tasks assessed/performed       Past Medical History:  Diagnosis Date  . Anemia   . 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 Diabetes mellitus without complication ([facility])   . Edema   . Lumbar pain   . Onychodystrophy   . Onychomycosis   . Paresthesia 05/17/2017  . Sleep apnea   . Thyroid disease   . TIA (transient ischemic attack) 05/26/2017  . 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 CHOLECYSTECTOMY    . DILATION AND CURETTAGE OF UTERUS    . HERNIA REPAIR      There were no vitals filed for this visit.  Subjective Assessment - 11/12/19 1305    Subjective  Pateint reports no significant improvement. the last 3 nights she has had to use muscle relaxewrs to sleep. She has tried her exercises but they do not seem to be helping. She was 25 minutres late today.    Limitations  Standing;Writing;Walking    How long can you stand comfortably?  has difficulty standing to cook    How long can you walk comfortably?  depends    Diagnostic tests  Nothing    Patient Stated Goals  to have less pain    Currently in Pain?  Yes    Pain Score  6     Pain Location  Back    Pain Orientation  Left;Right;Lower     Pain Descriptors / Indicators  Aching    Pain Type  Chronic pain    Pain Onset  More than a month ago    Pain Frequency  Intermittent    Aggravating Factors   standing and walking    Pain Relieving Factors  rest and stretching    Effect of Pain on Daily Activities  limits activity    Multiple Pain Sites  No                        [facility] Adult PT Treatment/Exercise - 11/12/19 0001      Lumbar Exercises: Supine   Bridge Limitations  2x10     Other Supine Lumbar Exercises  supine march 2x10    Other Supine Lumbar Exercises  supine clamshell 2x10; supine core breathing 2cx10       Manual Therapy   Manual Therapy  Soft tissue mobilization;Passive ROM;Joint mobilization;Manual Traction    Joint Mobilization  PA mobilizations to lower lumbar spine     Soft tissue mobilization  to lumbar spine     Manual Traction  LAD 3x45 sec each legf  PT Education - 11/12/19 1325    Education Details  reviewed HEP and symptom management    Person(s) Educated  Patient    Methods  Explanation;Demonstration;Tactile cues;Verbal cues    Comprehension  Verbalized understanding;Tactile cues required;Verbal cues required;Returned demonstration       PT Short Term Goals - 11/12/19 1353      PT SHORT TERM GOAL #1   Title  Patient will demonstrate full lumbar flexion without pain    Baseline  50 degrees with pain    Time  3    Period  Weeks    Target Date  10/02/19      PT SHORT TERM GOAL #2   Title  Patient will report no radiating pain into her hips and <3/10 pain in her lower back    Baseline  8/10 with pain radiating into both hips    Time  3    Period  Weeks    Status  On-going    Target Date  04/27/19      PT SHORT TERM GOAL #3   Title  Patient will be independent with basic stretching, strenghtening and self soft tissue mobilization program    Baseline  needs review of her exercise program    Time  3    Period  Weeks    Target Date  04/27/19         PT Long Term Goals - 10/09/19 1417      PT LONG TERM GOAL #1   Title  Patient will stand for 1 hour without increased pain in order to perfrom ADL's    Baseline  depends on the day, pain usually increases after shopping, cleaning , 30 min    Status  On-going    Target Date  11/27/19      PT LONG TERM GOAL #2   Title  Patient will go up and down 12 steps without pain in order to get into her house.    Baseline  pain increased in her back intermittently    Status  On-going    Target Date  11/27/19      PT LONG TERM GOAL #3   Title  Pt will be able to stand as needed for home, cooking, washing dishes with pain no more than 3/10.     Baseline  1 hour then has to sit or stretch    Status  On-going    Target Date  11/27/19      PT LONG TERM GOAL #4   Title  --            Plan - 11/12/19 1349    Clinical Impression Statement  Patient continues to have spasming in the lower lumbar spine. She reported a mild improvement in pain with manual therapy. Her treatment was limited by being late. She reported mild pain with all ther-ex. Therapy has not tried dry needling yet. She is interested in trying it. We willt try next visit to see if we can make some forward progress before her re-assessment.    Personal Factors and Comorbidities  Comorbidity 1;Comorbidity 2;Comorbidity 3+    Comorbidities  obesity, diabetes, low back pain,    Examination-Activity Limitations  Locomotion Level;Stairs;Lift;Squat    Stability/Clinical Decision Making  Evolving/Moderate complexity    Clinical Decision Making  Moderate    Rehab Potential  Good    PT Frequency  1x / week    PT Duration  6 weeks    PT Treatment/Interventions  ADLs/Self Care  Home Management;Therapeutic activities;Therapeutic exercise;Electrical Stimulation;Stair training;Gait training;Patient/family education;Manual techniques;Taping;Moist Heat;Cryotherapy;Ultrasound;Traction;Neuromuscular re-education;Manual lymph drainage;Dry  needling;Passive range of motion    PT Next Visit Plan  STW, try quadruped again, core focus and challenge in standing    PT Home Exercise Plan  piriformis stretch; ppt , tennis ball trigger point release, qped cat/cow to childs pose, standing hip, bridge add clam    Consulted and Agree with Plan of Care  Patient       Patient will benefit from skilled therapeutic intervention in order to improve the following deficits and impairments:  Abnormal gait, Decreased mobility, Decreased activity tolerance, Decreased strength, Pain, Obesity, Decreased range of motion, Difficulty walking, Improper body mechanics, Postural dysfunction, Increased muscle spasms  Visit Diagnosis: Muscle spasm of back  Acute bilateral low back pain without sciatica     Problem List Patient Active Problem List   Diagnosis Date Noted  . Plantar fasciitis 02/16/2019  . History of TIA (transient ischemic attack) 06/29/2017  . Slurred speech 06/28/2017  . Type 2 diabetes mellitus with microalbuminuria ([place]) 05/27/2017  . Diabetes mellitus type 2 with neurological manifestations ([place]) 05/27/2017  . Essential hypertension 05/27/2017  . Hyperlipidemia LDL goal <70 05/27/2017  . Hypothyroidism 05/27/2017  . TIA (transient ischemic attack) 05/26/2017  . Paresthesia 05/17/2017  . Iron deficiency 04/19/2016  . Sleep apnea 04/19/2016  . Chronic bilateral low back pain with left-sided sciatica 02/13/2016  . Onychodystrophy 08/26/2015  . Onychomycosis 08/26/2015    Carney Living PT DPT  11/12/2019, 2:03 PM  [facility] [address] [place], [place], [address] Phone: [phone]   Fax:  [phone]  Name: Leontina Skidmore MRN: [id number] Date of Birth: 11/26/1970

## 2019-11-19 ENCOUNTER — Ambulatory Visit: Payer: Medicaid Other | Admitting: Physical Therapy

## 2019-11-19 ENCOUNTER — Other Ambulatory Visit: Payer: Self-pay

## 2019-11-19 ENCOUNTER — Encounter: Payer: Self-pay | Admitting: Physical Therapy

## 2019-11-19 DIAGNOSIS — M545 Low back pain, unspecified: Secondary | ICD-10-CM

## 2019-11-19 DIAGNOSIS — M62830 Muscle spasm of back: Secondary | ICD-10-CM

## 2019-11-19 NOTE — Therapy (Signed)
[place], [place], [address] Phone: [phone]   Fax:  [phone]  Physical Therapy Treatment  Patient Details  Name: Lindsey Bowen MRN: [id number] Date of Birth: [birth year] Referring Provider (PT): Dr Eunice Blase    Encounter Date: 11/19/2019  PT End of Session - 11/19/19 1158    Visit Number  8    Number of Visits  10    Date for PT Re-Evaluation  11/28/19    Authorization Type  MCD    PT Start Time  1150   Patient was 84minutes late   PT Stop Time  1230    PT Time Calculation (min)  40 min    Activity Tolerance  Patient tolerated treatment well    Behavior During Therapy  [facility] for tasks assessed/performed       Past Medical History:  Diagnosis Date  . Anemia   . Diabetes mellitus type 2 with neurological manifestations ([place]) 05/27/2017  . Diabetes mellitus without complication ([place])   . Edema   . Lumbar pain   . Onychodystrophy   . Onychomycosis   . Paresthesia 05/17/2017  . Sleep apnea   . Thyroid disease   . TIA (transient ischemic attack) 05/26/2017  . Type 2 diabetes mellitus with microalbuminuria ([place]) 05/27/2017    Past Surgical History:  Procedure Laterality Date  . CHOLECYSTECTOMY    . DILATION AND CURETTAGE OF UTERUS    . HERNIA REPAIR      There were no vitals filed for this visit.  Subjective Assessment - 11/19/19 1156    Subjective  Patient reports her pain is not bad today but she had significant pain yesterday. She tried to clean the house but had to lean over the counter several times. She reports overall it is about the same.    Limitations  Standing;Writing;Walking    How long can you stand comfortably?  has difficulty standing to cook    How long can you walk comfortably?  depends    Diagnostic tests  Nothing    Patient Stated Goals  to have less pain    Currently in Pain?  Yes    Pain Score  4     Pain Location  Back    Pain Orientation  Left;Right;Lower     Pain Descriptors / Indicators  Aching    Pain Type  Chronic pain    Pain Onset  More than a month ago    Pain Frequency  Intermittent    Aggravating Factors   standing and walking    Pain Relieving Factors  rest and stretching    Effect of Pain on Daily Activities  limits activity    Multiple Pain Sites  No                        [facility] Adult PT Treatment/Exercise - 11/19/19 0001      Lumbar Exercises: Stretches   Single Knee to Chest Stretch  2 reps;30 seconds    Piriformis Stretch  3 reps;30 seconds      Lumbar Exercises: Aerobic   Nustep  LE only L6 for 5 min       Lumbar Exercises: Supine   Pelvic Tilt  2 reps;10 reps    Clam Limitations  2x10 red with abdominal breathing     Bridge Limitations  2x10     Other Supine Lumbar Exercises  supine march 2x10      Manual Therapy  Manual Therapy  Soft tissue mobilization;Passive ROM;Joint mobilization;Manual Traction    Manual therapy comments  skilled palpation of trigger points     Joint Mobilization  PA mobilizations to lower lumbar spine     Soft tissue mobilization  to lumbar spine     Manual Traction  LAD 3x45 sec each legf       Trigger Point Dry Needling - 11/19/19 0001    Consent Given?  Yes    Muscles Treated Back/Hip  Gluteus medius    Dry Needling Comments  2 left 2 right using 30x75 needle    Gluteus Medius Response  Twitch response elicited;Palpable increased muscle length           PT Education - 11/19/19 1157    Education Details  HEP and symptom mangement    Person(s) Educated  Patient    Methods  Explanation;Demonstration;Tactile cues;Verbal cues    Comprehension  Verbalized understanding;Returned demonstration;Verbal cues required;Tactile cues required       PT Short Term Goals - 11/19/19 1416      PT SHORT TERM GOAL #1   Title  Patient will demonstrate full lumbar flexion without pain    Baseline  50 degrees with pain    Time  3    Period  Weeks    Status  On-going     Target Date  10/02/19      PT SHORT TERM GOAL #2   Title  Patient will report no radiating pain into her hips and <3/10 pain in her lower back    Baseline  8/10 with pain radiating into both hips    Time  3    Period  Weeks    Status  On-going      PT SHORT TERM GOAL #3   Title  Patient will be independent with basic stretching, strenghtening and self soft tissue mobilization program    Time  3    Period  Weeks    Status  On-going        PT Long Term Goals - 10/09/19 1417      PT LONG TERM GOAL #1   Title  Patient will stand for 1 hour without increased pain in order to perfrom ADL's    Baseline  depends on the day, pain usually increases after shopping, cleaning , 30 min    Status  On-going    Target Date  11/27/19      PT LONG TERM GOAL #2   Title  Patient will go up and down 12 steps without pain in order to get into her house.    Baseline  pain increased in her back intermittently    Status  On-going    Target Date  11/27/19      PT LONG TERM GOAL #3   Title  Pt will be able to stand as needed for home, cooking, washing dishes with pain no more than 3/10.     Baseline  1 hour then has to sit or stretch    Status  On-going    Target Date  11/27/19      PT LONG TERM GOAL #4   Title  --            Plan - 11/19/19 1151    Clinical Impression Statement  Therapy trialed trigger point dry needling. It is one of the only things that have no been tirlled. She had trigger points in her upper gluteals. She had a good twitch respose. Therapy reviewed  stretches and ther-ex to reduce post needle soreness. She continue to have a low tolerance to basic ther-ex even after needling trial.    Personal Factors and Comorbidities  Comorbidity 1;Comorbidity 2;Comorbidity 3+    Comorbidities  obesity, diabetes, low back pain,    Examination-Activity Limitations  Locomotion Level;Stairs;Lift;Squat    Examination-Participation Restrictions  Community Activity;Meal Prep;Yard  Work;Laundry;Shop    Stability/Clinical Decision Making  Evolving/Moderate complexity    PT Treatment/Interventions  ADLs/Self Care Home Management;Therapeutic activities;Therapeutic exercise;Electrical Stimulation;Stair training;Gait training;Patient/family education;Manual techniques;Taping;Moist Heat;Cryotherapy;Ultrasound;Traction;Neuromuscular re-education;Manual lymph drainage;Dry needling;Passive range of motion    PT Next Visit Plan  re-assess with potential D/C for re-assessment    Consulted and Agree with Plan of Care  Patient       Patient will benefit from skilled therapeutic intervention in order to improve the following deficits and impairments:  Abnormal gait, Decreased mobility, Decreased activity tolerance, Decreased strength, Pain, Obesity, Decreased range of motion, Difficulty walking, Improper body mechanics, Postural dysfunction, Increased muscle spasms  Visit Diagnosis: Muscle spasm of back  Acute bilateral low back pain without sciatica     Problem List Patient Active Problem List   Diagnosis Date Noted  . Plantar fasciitis 02/16/2019  . History of TIA (transient ischemic attack) 06/29/2017  . Slurred speech 06/28/2017  . 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 Essential hypertension 05/27/2017  . Hyperlipidemia LDL goal <70 05/27/2017  . Hypothyroidism 05/27/2017  . TIA (transient ischemic attack) 05/26/2017  . Paresthesia 05/17/2017  . Iron deficiency 04/19/2016  . Sleep apnea 04/19/2016  . Chronic bilateral low back pain with left-sided sciatica 02/13/2016  . Onychodystrophy 08/26/2015  . Onychomycosis 08/26/2015    Dessie Coma PT DPT  11/19/2019, 2:19 PM  [facility] [address] [place], [place], [address] Phone: [phone]   Fax:  [phone]  Name: Lindsey Bowen MRN:  [id number] Date of Birth: 04/10/1971

## 2019-11-21 DIAGNOSIS — E1165 Type 2 diabetes mellitus with hyperglycemia: Secondary | ICD-10-CM | POA: Diagnosis not present

## 2019-11-21 DIAGNOSIS — R809 Proteinuria, unspecified: Secondary | ICD-10-CM | POA: Diagnosis not present

## 2019-11-21 DIAGNOSIS — Z794 Long term (current) use of insulin: Secondary | ICD-10-CM | POA: Diagnosis not present

## 2019-11-21 DIAGNOSIS — E1129 Type 2 diabetes mellitus with other diabetic kidney complication: Secondary | ICD-10-CM | POA: Diagnosis not present

## 2019-11-21 DIAGNOSIS — E1169 Type 2 diabetes mellitus with other specified complication: Secondary | ICD-10-CM | POA: Diagnosis not present

## 2019-11-21 DIAGNOSIS — E039 Hypothyroidism, unspecified: Secondary | ICD-10-CM | POA: Diagnosis not present

## 2019-11-21 DIAGNOSIS — E785 Hyperlipidemia, unspecified: Secondary | ICD-10-CM | POA: Diagnosis not present

## 2019-11-26 ENCOUNTER — Other Ambulatory Visit: Payer: Self-pay | Admitting: Nurse Practitioner

## 2019-11-26 ENCOUNTER — Other Ambulatory Visit: Payer: Self-pay | Admitting: Family Medicine

## 2019-11-26 DIAGNOSIS — G8929 Other chronic pain: Secondary | ICD-10-CM

## 2019-11-26 DIAGNOSIS — M5442 Lumbago with sciatica, left side: Secondary | ICD-10-CM

## 2019-11-27 ENCOUNTER — Encounter: Payer: Self-pay | Admitting: Physical Therapy

## 2019-11-27 ENCOUNTER — Other Ambulatory Visit: Payer: Self-pay

## 2019-11-27 ENCOUNTER — Ambulatory Visit: Payer: Medicaid Other | Attending: Family Medicine | Admitting: Physical Therapy

## 2019-11-27 DIAGNOSIS — M62830 Muscle spasm of back: Secondary | ICD-10-CM | POA: Diagnosis not present

## 2019-11-27 DIAGNOSIS — M545 Low back pain, unspecified: Secondary | ICD-10-CM

## 2019-11-28 ENCOUNTER — Encounter: Payer: Self-pay | Admitting: Physical Therapy

## 2019-11-28 NOTE — Therapy (Signed)
[place], [place], [address] Phone: [phone]   Fax:  [phone]  Physical Therapy Treatment/Discharge   Patient Details  Name: Sadi Arave MRN: [id number] Date of Birth: 03-07-1971 Referring Provider (PT): Dr Eunice Blase    Encounter Date: 11/27/2019  PT End of Session - 11/27/19 1119    Visit Number  9    Number of Visits  10    Date for PT Re-Evaluation  11/28/19    Authorization Type  MCD    Authorization - Visit Number  6    Authorization - Number of Visits  6    PT Start Time  1110   Patient 10 minutes late   PT Stop Time  1145    PT Time Calculation (min)  35 min    Activity Tolerance  Patient tolerated treatment well    Behavior During Therapy  [facility] for tasks assessed/performed       Past Medical History:  Diagnosis Date  . Anemia   . Diabetes mellitus type 2 with neurological manifestations ([place]) 05/27/2017  . Diabetes mellitus without complication ([place])   . Edema   . Lumbar pain   . Onychodystrophy   . Onychomycosis   . Paresthesia 05/17/2017  . Sleep apnea   . Thyroid disease   . TIA (transient ischemic attack) 05/26/2017  . 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 CHOLECYSTECTOMY    . DILATION AND CURETTAGE OF UTERUS    . HERNIA REPAIR      There were no vitals filed for this visit.  Subjective Assessment - 11/27/19 1112    Subjective  Patient continues to have to sit down and stretch to loosen the back up. The more moevement she does the more pain she has. She feels like it is getting worse. She is doing hte stuff at home but it is not making it anybetter.    Limitations  Standing;Writing;Walking    How long can you stand comfortably?  has difficulty standing to cook    How long can you walk comfortably?  depends    Diagnostic tests  Nothing    Currently in Pain?  Yes    Pain Score  5      Pain Location  Back    Pain Orientation  Right;Left    Pain Descriptors / Indicators  Aching    Pain Type  Chronic pain    Pain Onset  More than a month ago    Pain Frequency  Intermittent    Aggravating Factors   standing and walking    Pain Relieving Factors  rest and stretching    Effect of Pain on Daily Activities  limits activity                        [facility] Adult PT Treatment/Exercise - 11/28/19 0001      Lumbar Exercises: Stretches   Single Knee to Chest Stretch  2 reps;30 seconds    Lower Trunk Rotation Limitations  10x     Pelvic Tilt  10 reps    Piriformis Stretch  3 reps;30 seconds      Lumbar Exercises: Aerobic   Nustep  LE only L6 for 5 min       Lumbar Exercises: Standing   Other Standing Lumbar Exercises  scap retraction x20 red with core breathing  Lumbar Exercises: Supine   Pelvic Tilt  2 reps;10 reps    Clam Limitations  2x10 red with abdominal breathing     Bridge Limitations  2x10     Other Supine Lumbar Exercises  supine march 2x10      Manual Therapy   Manual Therapy  Soft tissue mobilization;Passive ROM;Joint mobilization;Manual Traction    Manual therapy comments  skilled palpation of trigger points     Joint Mobilization  PA mobilizations to lower lumbar spine     Soft tissue mobilization  to lumbar spine     Manual Traction  LAD 3x45 sec each legf             PT Education - 11/27/19 1116    Education Details  reviewed HEP and symptom management    Person(s) Educated  Patient    Methods  Explanation;Demonstration;Tactile cues;Verbal cues    Comprehension  Verbalized understanding;Returned demonstration;Verbal cues required;Tactile cues required       PT Short Term Goals - 11/28/19 1258      PT SHORT TERM GOAL #1   Title  Patient will demonstrate full lumbar flexion without pain    Baseline  50 degrees with pain    Time  3    Period  Weeks    Status  Not Met    Target Date  10/02/19      PT SHORT TERM  GOAL #2   Title  Patient will report no radiating pain into her hips and <3/10 pain in her lower back    Baseline  8/10 with pain radiating into both hips    Time  3    Period  Weeks    Status  Not Met    Target Date  04/27/19      PT SHORT TERM GOAL #3   Title  Patient will be independent with basic stretching, strenghtening and self soft tissue mobilization program    Baseline  \independent with basic program    Time  3    Period  Weeks    Status  Achieved    Target Date  04/27/19        PT Long Term Goals - 11/28/19 1300      PT LONG TERM GOAL #1   Title  Patient will stand for 1 hour without increased pain in order to perfrom ADL's    Baseline  still can not stand    Time  6    Period  Weeks    Status  On-going      PT LONG TERM GOAL #2   Title  Patient will go up and down 12 steps without pain in order to get into her house.    Baseline  pain increased in her back intermittently    Time  6    Period  Weeks    Status  On-going      PT LONG TERM GOAL #3   Title  Pt will be able to stand as needed for home, cooking, washing dishes with pain no more than 3/10.     Baseline  1 hour then has to sit or stretch    Time  6    Period  Weeks    Status  On-going            Plan - 11/27/19 1127    Clinical Impression Statement  Patient had no benefit from trigger point dry needling last session. Over-all she is not having much benefit with anything.  She has reached max postential with therapy. She is perfroming her stretches and exercises with little releif. At this time we will send patient back to MD for further follow up.    Personal Factors and Comorbidities  Comorbidity 1;Comorbidity 2;Comorbidity 3+    Comorbidities  obesity, diabetes, low back pain,    Examination-Activity Limitations  Locomotion Level;Stairs;Lift;Squat    Examination-Participation Restrictions  Community Activity;Meal Prep;Yard Work;Laundry;Shop    Stability/Clinical Decision Making   Evolving/Moderate complexity    Clinical Decision Making  Moderate    Rehab Potential  Good    PT Frequency  1x / week    PT Duration  6 weeks    PT Treatment/Interventions  ADLs/Self Care Home Management;Therapeutic activities;Therapeutic exercise;Electrical Stimulation;Stair training;Gait training;Patient/family education;Manual techniques;Taping;Moist Heat;Cryotherapy;Ultrasound;Traction;Neuromuscular re-education;Manual lymph drainage;Dry needling;Passive range of motion    PT Next Visit Plan  re-assess with potential D/C for re-assessment    PT Home Exercise Plan  piriformis stretch; ppt , tennis ball trigger point release, qped cat/cow to childs pose, standing hip, bridge add clam    Consulted and Agree with Plan of Care  Patient       Patient will benefit from skilled therapeutic intervention in order to improve the following deficits and impairments:  Abnormal gait, Decreased mobility, Decreased activity tolerance, Decreased strength, Pain, Obesity, Decreased range of motion, Difficulty walking, Improper body mechanics, Postural dysfunction, Increased muscle spasms  Visit Diagnosis: Muscle spasm of back  Acute bilateral low back pain without sciatica  PHYSICAL THERAPY DISCHARGE SUMMARY  Visits from Start of Care: 9  Current functional level related to goals / functional outcomes: Continues to have pain when standing and walking    Remaining deficits: Pain with ADL's    Education / Equipment: HEP   Plan: Patient agrees to discharge.  Patient goals were not met. Patient is being discharged due to lack of progress.  ?????        Problem List Patient Active Problem List   Diagnosis Date Noted  . Plantar fasciitis 02/16/2019  . History of TIA (transient ischemic attack) 06/29/2017  . Slurred speech 06/28/2017  . Type 2 diabetes mellitus with microalbuminuria ([place]) 05/27/2017  . Diabetes mellitus type 2 with neurological manifestations ([place]) 05/27/2017  . Essential  hypertension 05/27/2017  . Hyperlipidemia LDL goal <70 05/27/2017  . Hypothyroidism 05/27/2017  . TIA (transient ischemic attack) 05/26/2017  . Paresthesia 05/17/2017  . Iron deficiency 04/19/2016  . Sleep apnea 04/19/2016  . Chronic bilateral low back pain with left-sided sciatica 02/13/2016  . Onychodystrophy 08/26/2015  . Onychomycosis 08/26/2015    Carney Living 11/28/2019, 1:05 PM  [facility] [address] [place], [place], [address] Phone: [phone]   Fax:  [phone]  Name: Vicky Schleich MRN: [id number] Date of Birth: [birth year]

## 2019-11-29 DIAGNOSIS — R809 Proteinuria, unspecified: Secondary | ICD-10-CM | POA: Diagnosis not present

## 2019-11-29 DIAGNOSIS — E785 Hyperlipidemia, unspecified: Secondary | ICD-10-CM | POA: Diagnosis not present

## 2019-11-29 DIAGNOSIS — E039 Hypothyroidism, unspecified: Secondary | ICD-10-CM | POA: Diagnosis not present

## 2019-11-29 DIAGNOSIS — Z794 Long term (current) use of insulin: Secondary | ICD-10-CM | POA: Diagnosis not present

## 2019-11-29 DIAGNOSIS — E1129 Type 2 diabetes mellitus with other diabetic kidney complication: Secondary | ICD-10-CM | POA: Diagnosis not present

## 2019-11-29 DIAGNOSIS — E1169 Type 2 diabetes mellitus with other specified complication: Secondary | ICD-10-CM | POA: Diagnosis not present

## 2019-11-29 DIAGNOSIS — E1165 Type 2 diabetes mellitus with hyperglycemia: Secondary | ICD-10-CM | POA: Diagnosis not present

## 2019-12-11 DIAGNOSIS — Z1231 Encounter for screening mammogram for malignant neoplasm of breast: Secondary | ICD-10-CM | POA: Diagnosis not present

## 2019-12-17 ENCOUNTER — Other Ambulatory Visit: Payer: Self-pay

## 2019-12-17 ENCOUNTER — Ambulatory Visit: Payer: Medicaid Other | Admitting: Podiatry

## 2019-12-17 DIAGNOSIS — M79606 Pain in leg, unspecified: Secondary | ICD-10-CM

## 2019-12-17 DIAGNOSIS — E1151 Type 2 diabetes mellitus with diabetic peripheral angiopathy without gangrene: Secondary | ICD-10-CM | POA: Diagnosis not present

## 2019-12-17 DIAGNOSIS — M79675 Pain in left toe(s): Secondary | ICD-10-CM

## 2019-12-17 DIAGNOSIS — M79674 Pain in right toe(s): Secondary | ICD-10-CM

## 2019-12-17 DIAGNOSIS — B351 Tinea unguium: Secondary | ICD-10-CM | POA: Diagnosis not present

## 2019-12-18 NOTE — Progress Notes (Signed)
Subjective: 49 year old female presents the office today for diabetic foot evaluation as well as to have her nails trimmed as they are causing discomfort and they are thick and elongated.  She does get some occasional cramping pain to her legs.  She also describes back pain and this is causing her to stop walking some.  No significant swelling to the foot.  No erythema or warmth.  No open lesions. Denies any systemic complaints such as fevers, chills, nausea, vomiting. No acute changes since last appointment, and no other complaints at this time. Last A1c was 10.2.   Objective: AAO x3, NAD DP, PT pulses 1/4.  CRT less than 3 seconds. Sensation appears to be intact with Phoebe Perch monofilament. Nails are hypertrophic, dystrophic, brittle, discolored, elongated 10. No surrounding redness or drainage. Tenderness nails 1-5 bilaterally. No open lesions or pre-ulcerative lesions are identified today. No open lesions or pre-ulcerative lesions.  No pain with calf compression, swelling, warmth, erythema  Assessment: 49 year old female symptomatic onychomycosis, uncontrolled diabetes with decreased pulses  Plan: -All treatment options discussed with the patient including all alternatives, risks, complications.  -She is getting intermittent leg pain.  Given mild decrease in pulse as well as her leg symptoms will order arterial studies.  Some of her foot pain could also be coming from her back.  Recommend follow-up for her back. -Nails debrided x10 without any complications or bleeding. -Discussed daily foot inspection -Patient encouraged to call the office with any questions, concerns, change in symptoms.   RTC 6 months or sooner if needed  Vivi Barrack DPM

## 2019-12-20 ENCOUNTER — Telehealth: Payer: Self-pay | Admitting: *Deleted

## 2019-12-20 DIAGNOSIS — E1151 Type 2 diabetes mellitus with diabetic peripheral angiopathy without gangrene: Secondary | ICD-10-CM

## 2019-12-20 DIAGNOSIS — M79606 Pain in leg, unspecified: Secondary | ICD-10-CM

## 2019-12-20 DIAGNOSIS — M79675 Pain in left toe(s): Secondary | ICD-10-CM

## 2019-12-20 DIAGNOSIS — M79674 Pain in right toe(s): Secondary | ICD-10-CM

## 2019-12-20 NOTE — Telephone Encounter (Signed)
-----   Message from Vivi Barrack, DPM sent at 12/18/2019  6:33 PM EDT ----- Can you please order arterial duplex? Thanks.

## 2019-12-20 NOTE — Telephone Encounter (Signed)
[employer] - Medicaid requires clinicals for ABI with TBI [phone] prior to pre-cert, Case:  [id number]. Faxed clinicals to [employer].

## 2019-12-23 ENCOUNTER — Other Ambulatory Visit: Payer: Self-pay | Admitting: Nurse Practitioner

## 2019-12-23 DIAGNOSIS — Z794 Long term (current) use of insulin: Secondary | ICD-10-CM

## 2019-12-23 DIAGNOSIS — E1129 Type 2 diabetes mellitus with other diabetic kidney complication: Secondary | ICD-10-CM

## 2019-12-23 DIAGNOSIS — R809 Proteinuria, unspecified: Secondary | ICD-10-CM

## 2019-12-28 ENCOUNTER — Other Ambulatory Visit: Payer: Self-pay | Admitting: Podiatry

## 2019-12-28 DIAGNOSIS — E1151 Type 2 diabetes mellitus with diabetic peripheral angiopathy without gangrene: Secondary | ICD-10-CM

## 2019-12-28 DIAGNOSIS — M79675 Pain in left toe(s): Secondary | ICD-10-CM

## 2019-12-28 DIAGNOSIS — R0989 Other specified symptoms and signs involving the circulatory and respiratory systems: Secondary | ICD-10-CM

## 2019-12-28 DIAGNOSIS — M79674 Pain in right toe(s): Secondary | ICD-10-CM

## 2020-01-02 ENCOUNTER — Other Ambulatory Visit: Payer: Self-pay

## 2020-01-02 ENCOUNTER — Ambulatory Visit (HOSPITAL_COMMUNITY)
Admission: RE | Admit: 2020-01-02 | Discharge: 2020-01-02 | Disposition: A | Discharge status: Home / Self Care | Payer: Medicaid Other | Source: Ambulatory Visit | Attending: Cardiology | Admitting: Cardiology

## 2020-01-02 DIAGNOSIS — M79674 Pain in right toe(s): Secondary | ICD-10-CM

## 2020-01-02 DIAGNOSIS — M79675 Pain in left toe(s): Secondary | ICD-10-CM | POA: Insufficient documentation

## 2020-01-02 DIAGNOSIS — E1151 Type 2 diabetes mellitus with diabetic peripheral angiopathy without gangrene: Secondary | ICD-10-CM | POA: Diagnosis not present

## 2020-01-02 DIAGNOSIS — R0989 Other specified symptoms and signs involving the circulatory and respiratory systems: Secondary | ICD-10-CM | POA: Diagnosis not present

## 2020-01-08 ENCOUNTER — Telehealth: Payer: Self-pay | Admitting: *Deleted

## 2020-01-08 NOTE — Telephone Encounter (Signed)
I informed pt of Dr. Wagoner's review of results. 

## 2020-01-08 NOTE — Telephone Encounter (Signed)
-----   Message from Vivi Barrack, DPM sent at 01/06/2020  9:46 AM EDT ----- Milton Ferguson- please let her know that the circulation test is normal.

## 2020-02-11 ENCOUNTER — Ambulatory Visit: Payer: Medicaid Other | Admitting: Podiatry

## 2020-02-29 DIAGNOSIS — E1129 Type 2 diabetes mellitus with other diabetic kidney complication: Secondary | ICD-10-CM | POA: Diagnosis not present

## 2020-02-29 DIAGNOSIS — E1169 Type 2 diabetes mellitus with other specified complication: Secondary | ICD-10-CM | POA: Diagnosis not present

## 2020-02-29 DIAGNOSIS — R809 Proteinuria, unspecified: Secondary | ICD-10-CM | POA: Diagnosis not present

## 2020-02-29 DIAGNOSIS — E785 Hyperlipidemia, unspecified: Secondary | ICD-10-CM | POA: Diagnosis not present

## 2020-02-29 DIAGNOSIS — Z794 Long term (current) use of insulin: Secondary | ICD-10-CM | POA: Diagnosis not present

## 2020-02-29 DIAGNOSIS — E1165 Type 2 diabetes mellitus with hyperglycemia: Secondary | ICD-10-CM | POA: Diagnosis not present

## 2020-02-29 DIAGNOSIS — E039 Hypothyroidism, unspecified: Secondary | ICD-10-CM | POA: Diagnosis not present

## 2020-03-05 DIAGNOSIS — E1129 Type 2 diabetes mellitus with other diabetic kidney complication: Secondary | ICD-10-CM | POA: Diagnosis not present

## 2020-03-05 DIAGNOSIS — R609 Edema, unspecified: Secondary | ICD-10-CM | POA: Diagnosis not present

## 2020-03-05 DIAGNOSIS — E039 Hypothyroidism, unspecified: Secondary | ICD-10-CM | POA: Diagnosis not present

## 2020-03-05 DIAGNOSIS — R809 Proteinuria, unspecified: Secondary | ICD-10-CM | POA: Diagnosis not present

## 2020-03-05 DIAGNOSIS — E1169 Type 2 diabetes mellitus with other specified complication: Secondary | ICD-10-CM | POA: Diagnosis not present

## 2020-03-05 DIAGNOSIS — Z794 Long term (current) use of insulin: Secondary | ICD-10-CM | POA: Diagnosis not present

## 2020-03-05 DIAGNOSIS — E785 Hyperlipidemia, unspecified: Secondary | ICD-10-CM | POA: Diagnosis not present

## 2020-03-05 DIAGNOSIS — E1165 Type 2 diabetes mellitus with hyperglycemia: Secondary | ICD-10-CM | POA: Diagnosis not present

## 2020-03-28 ENCOUNTER — Encounter: Payer: Self-pay | Admitting: Nurse Practitioner

## 2020-03-28 ENCOUNTER — Ambulatory Visit: Payer: Medicaid Other | Attending: Nurse Practitioner | Admitting: Nurse Practitioner

## 2020-03-28 ENCOUNTER — Other Ambulatory Visit: Payer: Self-pay

## 2020-03-28 VITALS — BP 137/74 | HR 95 | Resp 16 | Wt 232.4 lb

## 2020-03-28 DIAGNOSIS — E559 Vitamin D deficiency, unspecified: Secondary | ICD-10-CM

## 2020-03-28 DIAGNOSIS — R0600 Dyspnea, unspecified: Secondary | ICD-10-CM | POA: Diagnosis not present

## 2020-03-28 DIAGNOSIS — E1129 Type 2 diabetes mellitus with other diabetic kidney complication: Secondary | ICD-10-CM | POA: Diagnosis not present

## 2020-03-28 DIAGNOSIS — Z794 Long term (current) use of insulin: Secondary | ICD-10-CM

## 2020-03-28 DIAGNOSIS — Z7689 Persons encountering health services in other specified circumstances: Secondary | ICD-10-CM | POA: Diagnosis not present

## 2020-03-28 DIAGNOSIS — R0609 Other forms of dyspnea: Secondary | ICD-10-CM

## 2020-03-28 DIAGNOSIS — R809 Proteinuria, unspecified: Secondary | ICD-10-CM | POA: Diagnosis not present

## 2020-03-28 NOTE — Progress Notes (Signed)
Assessment & Plan:  Jenisha was seen today for hypertension.  Diagnoses and all orders for this visit:  Dyspnea on exertion -     ECHOCARDIOGRAM COMPLETE; Future  Type 2 diabetes mellitus with microalbuminuria, with long-term current use of insulin (HCC) -     CMP14+EGFR -     Lipid panel  Vitamin D deficiency disease -     VITAMIN D 25 Hydroxy (Vit-D Deficiency, Fractures)  Encounter to establish care -     Ambulatory referral to Gynecology    Patient has been counseled on age-appropriate routine health concerns for screening and prevention. These are reviewed and up-to-date. Referrals have been placed accordingly. Immunizations are up-to-date or declined.    Subjective:   Chief Complaint  Patient presents with  . Hypertension   HPI Nikaya Nasby 49 y.o. female presents to office today for follow up.  Followed by Dr. Hartford Poli for her thyroid and diabetes.  She states her hydrochlorothiazide was increased from 25 to 50 mg recently by endocrinology due to fluid retention.  We did have a discussion regarding dietary modifications and I believe some of her fluid retention may be related to her diet as well.  She endorses chronic dyspnea on exertion which has been ongoing and waxing and waning over the past year.  Also experiencing intermittent chest pain.   Requesting referral to Gynecology to establish care. States she will need her IUD taken out early next year and would like to go ahead and get established. Denies any current GU symptoms. She has also begun a new relationship and is now sexually active. Declines STD testing today.     Review of Systems  Constitutional: Negative for fever, malaise/fatigue and weight loss.  HENT: Negative.  Negative for nosebleeds.   Eyes: Negative.  Negative for blurred vision, double vision and photophobia.  Respiratory: Positive for shortness of breath. Negative for cough.   Cardiovascular: Negative.  Negative for chest pain,  palpitations and leg swelling.  Gastrointestinal: Negative.  Negative for heartburn, nausea and vomiting.  Musculoskeletal: Negative.  Negative for myalgias.  Neurological: Negative.  Negative for dizziness, focal weakness, seizures and headaches.  Psychiatric/Behavioral: Negative.  Negative for suicidal ideas.    Past Medical History:  Diagnosis Date  . Anemia   . Diabetes mellitus type 2 with neurological manifestations ([place]) 05/27/2017  . Diabetes mellitus without complication ([place])   . Edema   . Lumbar pain   . Onychodystrophy   . Onychomycosis   . Paresthesia 05/17/2017  . Sleep apnea   . Thyroid disease   . TIA (transient ischemic attack) 05/26/2017  . 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 CHOLECYSTECTOMY    . DILATION AND CURETTAGE OF UTERUS    . HERNIA REPAIR      Family History  Problem Relation Age of Onset  . Diabetes Mother   . Hypertension Mother   . Heart attack Maternal Grandmother   . Stroke Maternal Grandmother   . Diabetes Maternal Grandfather   . Cancer Maternal Grandfather     Social History Reviewed with no changes to be made today.   Outpatient Medications Prior to Visit  Medication Sig Dispense Refill  . ACCU-CHEK GUIDE test strip USE TO CHECK BLOOD SUGAR 4 TIME(S) DAILY.    Marland Kitchen acetaminophen (TYLENOL) 650 MG CR tablet Take 650 mg by mouth every 8 (eight) hours as needed for pain.    Marland Kitchen albuterol (VENTOLIN  HFA) 108 (90 Base) MCG/ACT inhaler Inhale 1-2 puffs into the lungs every 6 (six) hours as needed for wheezing or shortness of breath. 18 g 1  . BD PEN NEEDLE NANO 2ND GEN 32G X 4 MM MISC ONE EACH BY OTHER ROUTE DAILY.    Marland Kitchen Blood Glucose Monitoring Suppl (ACCU-CHEK GUIDE ME) w/Device KIT USE AS DIRECTED TO MONITOR BLOOD SUGAR.    . cetirizine (ZYRTEC) 10 MG tablet TAKE 1 TABLET BY MOUTH EVERY DAY 90 tablet 0  . cyclobenzaprine (FLEXERIL) 10 MG tablet TAKE 1 TABLET  BY MOUTH EVERYDAY AT BEDTIME 30 tablet 0  . diclofenac sodium (VOLTAREN) 1 % GEL Apply 2 g topically 4 (four) times daily. Rub into affected area of foot 2 to 4 times daily 100 g 2  . DULoxetine 40 MG CPEP Take 40 mg by mouth daily. 30 capsule 1  . fluticasone (FLONASE) 50 MCG/ACT nasal spray Place 2 sprays into both nostrils daily. 16 g 2  . gabapentin (NEURONTIN) 100 MG capsule TAKE 1 CAPSULE BY MOUTH THREE TIMES A DAY 90 capsule 3  . hydrochlorothiazide (HYDRODIURIL) 25 MG tablet Take 25 mg by mouth daily.    . Insulin Disposable Pump (OMNIPOD 10 PACK) MISC by Does not apply route.    . insulin lispro (HUMALOG) 100 UNIT/ML cartridge Inject 60 Units into the skin. Set in her Omnipod    . insulin regular human CONCENTRATED (HUMULIN R U-500 KWIKPEN) 500 UNIT/ML kwikpen 70 units before breakfast, 60 units before lunch and 60 units before supper.    . levothyroxine (SYNTHROID, LEVOTHROID) 175 MCG tablet Take 1 tablet (175 mcg total) by mouth daily before breakfast. 30 tablet 0  . liraglutide (VICTOZA) 18 MG/3ML SOPN Inject 1.8 mg into the skin daily.    Marland Kitchen lisinopril (PRINIVIL,ZESTRIL) 5 MG tablet Take 5 mg by mouth daily.    . Multiple Vitamin (MULTIVITAMIN) tablet Take 1 tablet by mouth daily.    . naproxen sodium (ALEVE) 220 MG tablet Take 220 mg by mouth daily as needed (headache).    . rosuvastatin (CRESTOR) 20 MG tablet Take 1 tablet (20 mg total) by mouth daily. 90 tablet 2  . sodium chloride (OCEAN) 0.65 % SOLN nasal spray Place 1 spray into both nostrils as needed for congestion.  0   No facility-administered medications prior to visit.    Allergies  Allergen Reactions  . Dust Mite Extract Hives and Itching  . Shellfish Allergy Anaphylaxis       Objective:    BP 137/74   Pulse 95   Resp 16   Wt 232 lb 6.4 oz (105.4 kg)   SpO2 96%   BMI 39.89 kg/m  Wt Readings from Last 3 Encounters:  03/28/20 232 lb 6.4 oz (105.4 kg)  08/22/19 224 lb (101.6 kg)  06/01/19 221 lb (100.2 kg)     Physical Exam Vitals and nursing note reviewed.  Constitutional:      Appearance: She is well-developed.  HENT:     Head: Normocephalic and atraumatic.  Cardiovascular:     Rate and Rhythm: Normal rate and regular rhythm.     Heart sounds: Normal heart sounds. No murmur heard.  No friction rub. No gallop.   Pulmonary:     Effort: Pulmonary effort is normal. No tachypnea or respiratory distress.     Breath sounds: Normal breath sounds. No decreased breath sounds, wheezing, rhonchi or rales.  Chest:     Chest wall: No tenderness.  Abdominal:     General:  Bowel sounds are normal.     Palpations: Abdomen is soft.  Musculoskeletal:        General: Normal range of motion.     Cervical back: Normal range of motion.  Skin:    General: Skin is warm and dry.  Neurological:     Mental Status: She is alert and oriented to person, place, and time.     Coordination: Coordination normal.  Psychiatric:        Behavior: Behavior normal. Behavior is cooperative.        Thought Content: Thought content normal.        Judgment: Judgment normal.          Patient has been counseled extensively about nutrition and exercise as well as the importance of adherence with medications and regular follow-up. The patient was given clear instructions to go to ER or return to medical center if symptoms don't improve, worsen or new problems develop. The patient verbalized understanding.   Follow-up: Return in about 6 months (around 09/26/2020).   Gildardo Pounds, FNP-BC [facility] [place], [place]   03/28/2020, 5:37 PM

## 2020-03-29 LAB — CMP14+EGFR
ALT: 29 IU/L (ref 0–32)
AST: 19 IU/L (ref 0–40)
Albumin/Globulin Ratio: 1.7 (ref 1.2–2.2)
Albumin: 4.2 g/dL (ref 3.8–4.8)
Alkaline Phosphatase: 79 IU/L (ref 44–121)
BUN/Creatinine Ratio: 19 (ref 9–23)
BUN: 21 mg/dL (ref 6–24)
Bilirubin Total: <0.2 mg/dL (ref 0.0–1.2)
CO2: 31 mmol/L — ABNORMAL HIGH (ref 20–29)
Calcium: 9.6 mg/dL (ref 8.7–10.2)
Chloride: 99 mmol/L (ref 96–106)
Creatinine, Ser: 1.08 mg/dL — ABNORMAL HIGH (ref 0.57–1.00)
GFR calc Af Amer: 71 mL/min/{1.73_m2} (ref >59)
GFR calc non Af Amer: 61 mL/min/{1.73_m2} (ref >59)
Globulin, Total: 2.5 g/dL (ref 1.5–4.5)
Glucose: 211 mg/dL — ABNORMAL HIGH (ref 65–99)
Potassium: 3.3 mmol/L — ABNORMAL LOW (ref 3.5–5.2)
Sodium: 143 mmol/L (ref 134–144)
Total Protein: 6.7 g/dL (ref 6.0–8.5)

## 2020-03-29 LAB — LIPID PANEL
Chol/HDL Ratio: 4.7 ratio — ABNORMAL HIGH (ref 0.0–4.4)
Cholesterol, Total: 154 mg/dL (ref 100–199)
HDL: 33 mg/dL — ABNORMAL LOW (ref >39)
LDL Chol Calc (NIH): 73 mg/dL (ref 0–99)
Triglycerides: 294 mg/dL — ABNORMAL HIGH (ref 0–149)
VLDL Cholesterol Cal: 48 mg/dL — ABNORMAL HIGH (ref 5–40)

## 2020-03-29 LAB — VITAMIN D 25 HYDROXY (VIT D DEFICIENCY, FRACTURES): Vit D, 25-Hydroxy: 19.1 ng/mL — ABNORMAL LOW (ref 30.0–100.0)

## 2020-03-30 ENCOUNTER — Other Ambulatory Visit: Payer: Self-pay | Admitting: Nurse Practitioner

## 2020-03-30 DIAGNOSIS — E782 Mixed hyperlipidemia: Secondary | ICD-10-CM

## 2020-03-30 MED ORDER — VITAMIN D (ERGOCALCIFEROL) 1.25 MG (50000 UNIT) PO CAPS: 50000.0000 [IU] | ORAL_CAPSULE | ORAL | 1 refills | Status: DC

## 2020-03-30 MED ORDER — ROSUVASTATIN CALCIUM 20 MG PO TABS: 20.0000 mg | ORAL_TABLET | Freq: Every day | ORAL | 2 refills | Status: DC

## 2020-04-15 DIAGNOSIS — R609 Edema, unspecified: Secondary | ICD-10-CM | POA: Diagnosis not present

## 2020-04-15 DIAGNOSIS — E039 Hypothyroidism, unspecified: Secondary | ICD-10-CM | POA: Diagnosis not present

## 2020-05-08 ENCOUNTER — Other Ambulatory Visit: Payer: Self-pay

## 2020-05-08 ENCOUNTER — Ambulatory Visit (HOSPITAL_COMMUNITY)
Admission: RE | Admit: 2020-05-08 | Discharge: 2020-05-08 | Disposition: A | Discharge status: Home / Self Care | Payer: Medicaid Other | Source: Ambulatory Visit | Attending: Nurse Practitioner | Admitting: Nurse Practitioner

## 2020-05-08 DIAGNOSIS — E119 Type 2 diabetes mellitus without complications: Secondary | ICD-10-CM | POA: Insufficient documentation

## 2020-05-08 DIAGNOSIS — R0600 Dyspnea, unspecified: Secondary | ICD-10-CM

## 2020-05-08 DIAGNOSIS — Z8673 Personal history of transient ischemic attack (TIA), and cerebral infarction without residual deficits: Secondary | ICD-10-CM | POA: Diagnosis not present

## 2020-05-08 DIAGNOSIS — G4730 Sleep apnea, unspecified: Secondary | ICD-10-CM | POA: Insufficient documentation

## 2020-05-08 DIAGNOSIS — R0609 Other forms of dyspnea: Secondary | ICD-10-CM

## 2020-05-08 DIAGNOSIS — E079 Disorder of thyroid, unspecified: Secondary | ICD-10-CM | POA: Diagnosis not present

## 2020-05-08 LAB — ECHOCARDIOGRAM COMPLETE
Area-P 1/2: 3.6 cm2
S' Lateral: 2.2 cm

## 2020-05-08 NOTE — Progress Notes (Signed)
  Echocardiogram 2D Echocardiogram has been performed.  Lindsey Bowen 05/08/2020, 11:06 AM

## 2020-05-14 DIAGNOSIS — H1013 Acute atopic conjunctivitis, bilateral: Secondary | ICD-10-CM | POA: Diagnosis not present

## 2020-05-15 ENCOUNTER — Telehealth: Payer: Self-pay | Admitting: Nurse Practitioner

## 2020-05-15 NOTE — Telephone Encounter (Signed)
Patient is schedule at  [facility] on 06/17/20 @ 8:35AM  . I called her and she is aware of her appointment

## 2020-05-15 NOTE — Telephone Encounter (Signed)
Arna Medici can you please f/u with the patient.   Copied from CRM [id number]. Topic: Quick Communication - See Telephone Encounter >> May 14, 2020  3:55 PM Aretta Nip wrote: CRM for notification. See Telephone encounter for: 05/14/20. Contact pt about GYN referral has not been contacted  [phone]

## 2020-06-04 DIAGNOSIS — E1169 Type 2 diabetes mellitus with other specified complication: Secondary | ICD-10-CM | POA: Diagnosis not present

## 2020-06-04 DIAGNOSIS — E1165 Type 2 diabetes mellitus with hyperglycemia: Secondary | ICD-10-CM | POA: Diagnosis not present

## 2020-06-04 DIAGNOSIS — Z794 Long term (current) use of insulin: Secondary | ICD-10-CM | POA: Diagnosis not present

## 2020-06-04 DIAGNOSIS — E039 Hypothyroidism, unspecified: Secondary | ICD-10-CM | POA: Diagnosis not present

## 2020-06-04 DIAGNOSIS — E1129 Type 2 diabetes mellitus with other diabetic kidney complication: Secondary | ICD-10-CM | POA: Diagnosis not present

## 2020-06-04 DIAGNOSIS — R809 Proteinuria, unspecified: Secondary | ICD-10-CM | POA: Diagnosis not present

## 2020-06-04 DIAGNOSIS — E785 Hyperlipidemia, unspecified: Secondary | ICD-10-CM | POA: Diagnosis not present

## 2020-06-06 DIAGNOSIS — Z794 Long term (current) use of insulin: Secondary | ICD-10-CM | POA: Diagnosis not present

## 2020-06-06 DIAGNOSIS — E1129 Type 2 diabetes mellitus with other diabetic kidney complication: Secondary | ICD-10-CM | POA: Diagnosis not present

## 2020-06-06 DIAGNOSIS — R809 Proteinuria, unspecified: Secondary | ICD-10-CM | POA: Diagnosis not present

## 2020-06-06 DIAGNOSIS — E1165 Type 2 diabetes mellitus with hyperglycemia: Secondary | ICD-10-CM | POA: Diagnosis not present

## 2020-06-16 ENCOUNTER — Encounter: Payer: Self-pay | Admitting: Podiatry

## 2020-06-16 ENCOUNTER — Ambulatory Visit (INDEPENDENT_AMBULATORY_CARE_PROVIDER_SITE_OTHER): Payer: Medicaid Other | Admitting: Podiatry

## 2020-06-16 ENCOUNTER — Other Ambulatory Visit: Payer: Self-pay

## 2020-06-16 ENCOUNTER — Telehealth: Payer: Self-pay | Admitting: Nurse Practitioner

## 2020-06-16 DIAGNOSIS — L600 Ingrowing nail: Secondary | ICD-10-CM | POA: Diagnosis not present

## 2020-06-16 DIAGNOSIS — E1151 Type 2 diabetes mellitus with diabetic peripheral angiopathy without gangrene: Secondary | ICD-10-CM

## 2020-06-16 DIAGNOSIS — M79674 Pain in right toe(s): Secondary | ICD-10-CM | POA: Diagnosis not present

## 2020-06-16 MED ORDER — CEPHALEXIN 500 MG PO CAPS: 500.0000 mg | ORAL_CAPSULE | Freq: Three times a day (TID) | ORAL | 0 refills | Status: DC

## 2020-06-16 NOTE — Telephone Encounter (Signed)
Copied from CRM [id number]. Topic: General - Other >> Jun 16, 2020  2:01 PM Gwenlyn Fudge wrote: Reason for CRM: Pt called stating that she is needing to have her medical clearance paperwork so that she can go back to work. She states that she is needing them by tomorrow as they have been at the office for 2 weeks. Please advise.

## 2020-06-16 NOTE — Patient Instructions (Signed)
Diabetes Mellitus and Foot Care Foot care is an important part of your health, especially when you have diabetes. Diabetes may cause you to have problems because of poor blood flow (circulation) to your feet and legs, which can cause your skin to:  Become thinner and drier.  Break more easily.  Heal more slowly.  Peel and crack. You may also have nerve damage (neuropathy) in your legs and feet, causing decreased feeling in them. This means that you may not notice minor injuries to your feet that could lead to more serious problems. Noticing and addressing any potential problems early is the best way to prevent future foot problems. How to care for your feet Foot hygiene  Wash your feet daily with warm water and mild soap. Do not use hot water. Then, pat your feet and the areas between your toes until they are completely dry. Do not soak your feet as this can dry your skin.  Trim your toenails straight across. Do not dig under them or around the cuticle. File the edges of your nails with an emery board or nail file.  Apply a moisturizing lotion or petroleum jelly to the skin on your feet and to dry, brittle toenails. Use lotion that does not contain alcohol and is unscented. Do not apply lotion between your toes. Shoes and socks  Wear clean socks or stockings every day. Make sure they are not too tight. Do not wear knee-high stockings since they may decrease blood flow to your legs.  Wear shoes that fit properly and have enough cushioning. Always look in your shoes before you put them on to be sure there are no objects inside.  To break in new shoes, wear them for just a few hours a day. This prevents injuries on your feet. Wounds, scrapes, corns, and calluses  Check your feet daily for blisters, cuts, bruises, sores, and redness. If you cannot see the bottom of your feet, use a mirror or ask someone for help.  Do not cut corns or calluses or try to remove them with medicine.  If you  find a minor scrape, cut, or break in the skin on your feet, keep it and the skin around it clean and dry. You may clean these areas with mild soap and water. Do not clean the area with peroxide, alcohol, or iodine.  If you have a wound, scrape, corn, or callus on your foot, look at it several times a day to make sure it is healing and not infected. Check for: ? Redness, swelling, or pain. ? Fluid or blood. ? Warmth. ? Pus or a bad smell. General instructions  Do not cross your legs. This may decrease blood flow to your feet.  Do not use heating pads or hot water bottles on your feet. They may burn your skin. If you have lost feeling in your feet or legs, you may not know this is happening until it is too late.  Protect your feet from hot and cold by wearing shoes, such as at the beach or on hot pavement.  Schedule a complete foot exam at least once a year (annually) or more often if you have foot problems. If you have foot problems, report any cuts, sores, or bruises to your health care provider immediately. Contact a health care provider if:  You have a medical condition that increases your risk of infection and you have any cuts, sores, or bruises on your feet.  You have an injury that is not   healing.  You have redness on your legs or feet.  You feel burning or tingling in your legs or feet.  You have pain or cramps in your legs and feet.  Your legs or feet are numb.  Your feet always feel cold.  You have pain around a toenail. Get help right away if:  You have a wound, scrape, corn, or callus on your foot and: ? You have pain, swelling, or redness that gets worse. ? You have fluid or blood coming from the wound, scrape, corn, or callus. ? Your wound, scrape, corn, or callus feels warm to the touch. ? You have pus or a bad smell coming from the wound, scrape, corn, or callus. ? You have a fever. ? You have a red line going up your leg. Summary  Check your feet every day  for cuts, sores, red spots, swelling, and blisters.  Moisturize feet and legs daily.  Wear shoes that fit properly and have enough cushioning.  If you have foot problems, report any cuts, sores, or bruises to your health care provider immediately.  Schedule a complete foot exam at least once a year (annually) or more often if you have foot problems. This information is not intended to replace advice given to you by your health care provider. Make sure you discuss any questions you have with your health care provider. Document Revised: 03/07/2019 Document Reviewed: 07/16/2016 Elsevier Patient Education  2020 [employer]  

## 2020-06-17 ENCOUNTER — Ambulatory Visit (INDEPENDENT_AMBULATORY_CARE_PROVIDER_SITE_OTHER): Payer: Medicaid Other | Admitting: Family Medicine

## 2020-06-17 ENCOUNTER — Encounter: Payer: Self-pay | Admitting: Family Medicine

## 2020-06-17 ENCOUNTER — Other Ambulatory Visit (HOSPITAL_COMMUNITY)
Admission: RE | Admit: 2020-06-17 | Discharge: 2020-06-17 | Disposition: A | Discharge status: Home / Self Care | Payer: Medicaid Other | Source: Ambulatory Visit | Attending: Obstetrics and Gynecology | Admitting: Obstetrics and Gynecology

## 2020-06-17 VITALS — BP 126/85 | HR 91 | Wt 240.2 lb

## 2020-06-17 DIAGNOSIS — Z01419 Encounter for gynecological examination (general) (routine) without abnormal findings: Secondary | ICD-10-CM | POA: Insufficient documentation

## 2020-06-17 NOTE — Progress Notes (Signed)
   GYNECOLOGY OFFICE VISIT NOTE  History:   Lindsey Bowen is a 49 y.o. with hx of uncontrolled insulin dependent DM2, HTN, hypothryoidism, prior TIA, who presents to establish care.   Seen at PCP office on 03/28/2020, at that time expressed desire to establish OB/GYN care as she would need to have her IUD taken out in the near future Declined STI screening at that time  Had [facility] for bleeding in 2014, reports no concerning finding other than polyps Had Mirena inserted in 2016, continued to have pain and irregular bleeding, went for exam and found it was malpositioned, then had it reinserted in March 2017, since then very happy with Mirena   Past Medical History:  Diagnosis Date  . Anemia   . 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 Diabetes mellitus without complication (HCC)   . Edema   . Lumbar pain   . Onychodystrophy   . Onychomycosis   . Paresthesia 05/17/2017  . Sleep apnea   . Thyroid disease   . TIA (transient ischemic attack) 05/26/2017  . 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 CHOLECYSTECTOMY    . DILATION AND CURETTAGE OF UTERUS    . HERNIA REPAIR      The following portions of the patient's history were reviewed and updated as appropriate: allergies, current medications, past family history, past medical history, past social history, past surgical history and problem list.   Health Maintenance:  Normal pap and negative HRHPV: 06/01/2019.  Normal mammogram: 12/11/2019.   Review of Systems:  Pertinent items noted in HPI and remainder of comprehensive ROS otherwise negative.  Physical Exam:  BP 126/85   Pulse 91   Wt 240 lb 3.2 oz (109 kg)   BMI 41.23 kg/m  CONSTITUTIONAL: Well-developed, well-nourished female in no acute distress.  HEENT:  Normocephalic, atraumatic. External right and left ear normal. No scleral icterus.  NECK: Normal range of  motion, supple, no masses noted on observation SKIN: No rash noted. Not diaphoretic. No erythema. No pallor. MUSCULOSKELETAL: Normal range of motion. No edema noted. NEUROLOGIC: Alert and oriented to person, place, and time. Normal muscle tone coordination.  PSYCHIATRIC: Normal mood and affect. Normal behavior. Normal judgment and thought content. RESPIRATORY: Effort normal, no problems with respiration noted PELVIC: Deferred  Labs and Imaging No results found for this or any previous visit (from the past 168 hour(s)). No results found.    Assessment and Plan:   Problem List Items Addressed This Visit      Other   Well woman exam - Primary    Pap and mammogram up to date Requests STI screening today Mood is good Currently has Mirena IUD in place since 2017, return March 2022 for removal/reinsertion Routine metabolic screening per PCP      Relevant Orders   Cervicovaginal ancillary only( [place])   HIV Antibody (routine testing w rflx)   Hepatitis C Antibody   Hepatitis B Surface AntiGEN   RPR      Routine preventative health maintenance measures emphasized. Please refer to After Visit Summary for other counseling recommendations.   Return in about 14 months (around 08/31/2021) for IUD removal/reinsertion.    Total face-to-face time with patient: 20 minutes.  Over 50% of encounter was spent on counseling and coordination of care.   Venora Maples, MD/[facility] Center for [employer], [facility]

## 2020-06-17 NOTE — Patient Instructions (Signed)

## 2020-06-17 NOTE — Assessment & Plan Note (Addendum)
Pap and mammogram up to date Requests STI screening today Mood is good Currently has Mirena IUD in place since 2017, return March 2022 for removal/reinsertion Routine metabolic screening per PCP

## 2020-06-17 NOTE — Telephone Encounter (Signed)
Spoke to patient and informed her paperwork is ready. Pt. Would like CMA fax it. CMA will fax the form .

## 2020-06-18 LAB — CERVICOVAGINAL ANCILLARY ONLY
Chlamydia: NEGATIVE
Comment: NEGATIVE
Comment: NEGATIVE
Comment: NORMAL
Neisseria Gonorrhea: NEGATIVE
Trichomonas: NEGATIVE

## 2020-06-18 LAB — RPR: RPR Ser Ql: NONREACTIVE

## 2020-06-18 LAB — HEPATITIS B SURFACE ANTIGEN: Hepatitis B Surface Ag: NEGATIVE

## 2020-06-18 LAB — HIV ANTIBODY (ROUTINE TESTING W REFLEX): HIV Screen 4th Generation wRfx: NONREACTIVE

## 2020-06-18 LAB — HEPATITIS C ANTIBODY: Hep C Virus Ab: <0.1 s/co ratio (ref 0.0–0.9)

## 2020-06-22 NOTE — Progress Notes (Signed)
Subjective: 49 year old female presents the office today for diabetic foot exam and also concerned about a discomfort to her ingrown toenail right big toe. She said that she got a pedicure and was feel that there is a piece of nail still in the corner of the nail at the distal aspect she points to. Denies any drainage approximately swelling. She is diabetic and last A1c was 8.4. She has no other symptoms today. No ulcerations. Denies any systemic complaints such as fevers, chills, nausea, vomiting. No acute changes since last appointment, and no other complaints at this time.   Objective: AAO x3, NAD DP/PT pulses palpable bilaterally, CRT less than 3 seconds On the right hallux toenail there is incurvation present of the nail borders but there is no drainage or pus or edema, erythema. Tenderness to the distal aspect of the nail borders. There is no open lesions identified. No areas of pinpoint tenderness. MMT 5/5. No pain with calf compression, swelling, warmth, erythema  Assessment: Ingrown toenail right hallux  Plan: -All treatment options discussed with the patient including all alternatives, risks, complications.  -Discussed partial nail avulsion which was to hold off on this. I did sharply debride the symptomatic portion of ingrown toenail without complications. Prescribed Keflex as precaution. Antibiotic ointment daily. -Patient encouraged to call the office with any questions, concerns, change in symptoms.   Vivi Barrack DPM

## 2020-07-07 DIAGNOSIS — Z20822 Contact with and (suspected) exposure to covid-19: Secondary | ICD-10-CM | POA: Diagnosis not present

## 2020-07-22 ENCOUNTER — Other Ambulatory Visit: Payer: Self-pay | Admitting: Nurse Practitioner

## 2020-07-29 ENCOUNTER — Other Ambulatory Visit: Payer: Medicaid Other

## 2020-07-29 DIAGNOSIS — Z20822 Contact with and (suspected) exposure to covid-19: Secondary | ICD-10-CM | POA: Diagnosis not present

## 2020-07-29 DIAGNOSIS — J3489 Other specified disorders of nose and nasal sinuses: Secondary | ICD-10-CM | POA: Diagnosis not present

## 2020-08-25 DIAGNOSIS — M5417 Radiculopathy, lumbosacral region: Secondary | ICD-10-CM | POA: Diagnosis not present

## 2020-08-25 DIAGNOSIS — M5459 Other low back pain: Secondary | ICD-10-CM | POA: Diagnosis not present

## 2020-09-03 DIAGNOSIS — E1129 Type 2 diabetes mellitus with other diabetic kidney complication: Secondary | ICD-10-CM | POA: Diagnosis not present

## 2020-09-03 DIAGNOSIS — R809 Proteinuria, unspecified: Secondary | ICD-10-CM | POA: Diagnosis not present

## 2020-09-03 DIAGNOSIS — E1165 Type 2 diabetes mellitus with hyperglycemia: Secondary | ICD-10-CM | POA: Diagnosis not present

## 2020-09-03 DIAGNOSIS — Z794 Long term (current) use of insulin: Secondary | ICD-10-CM | POA: Diagnosis not present

## 2020-09-06 DIAGNOSIS — F331 Major depressive disorder, recurrent, moderate: Secondary | ICD-10-CM | POA: Diagnosis not present

## 2020-09-13 DIAGNOSIS — M5416 Radiculopathy, lumbar region: Secondary | ICD-10-CM | POA: Diagnosis not present

## 2020-09-15 ENCOUNTER — Other Ambulatory Visit: Payer: Self-pay | Admitting: Nurse Practitioner

## 2020-09-15 ENCOUNTER — Ambulatory Visit: Payer: Medicaid Other | Admitting: Podiatry

## 2020-09-23 ENCOUNTER — Encounter: Payer: Self-pay | Admitting: Family Medicine

## 2020-09-23 ENCOUNTER — Ambulatory Visit (INDEPENDENT_AMBULATORY_CARE_PROVIDER_SITE_OTHER): Payer: Medicaid Other | Admitting: Family Medicine

## 2020-09-23 ENCOUNTER — Other Ambulatory Visit: Payer: Self-pay

## 2020-09-23 VITALS — BP 132/82 | HR 84 | Wt 240.8 lb

## 2020-09-23 DIAGNOSIS — Z975 Presence of (intrauterine) contraceptive device: Secondary | ICD-10-CM | POA: Diagnosis not present

## 2020-09-23 DIAGNOSIS — Z30430 Encounter for insertion of intrauterine contraceptive device: Secondary | ICD-10-CM

## 2020-09-23 LAB — POCT PREGNANCY, URINE: Preg Test, Ur: NEGATIVE

## 2020-09-23 MED ORDER — LEVONORGESTREL 19.5 MCG/DAY IU IUD
INTRAUTERINE_SYSTEM | Freq: Once | INTRAUTERINE | Status: AC
  Administered 2020-09-23: 1 via INTRAUTERINE

## 2020-09-23 NOTE — Patient Instructions (Signed)

## 2020-09-23 NOTE — Progress Notes (Signed)
   Marni Franzoni is a 50 y.o. [phone] here for IUD removal and re-insertion. No GYN concerns.  Last pap smear was on 06/01/2019 and was normal.  IUD Removal  Patient identified, informed consent performed, consent signed.  Patient was in the dorsal lithotomy position, normal external genitalia was noted.  A speculum was placed in the patient's vagina, normal discharge was noted, no lesions. The cervix was visualized, no lesions, no abnormal discharge.  The strings of the IUD were not immediately visible. A endocervical brush was used to try and locate the strings but was unsuccessful. Subsequently the uterus was sounded to 11 cm with an EMB pipelle and then an IUD hook was introduced into the uterus. Upon removal of the IUD hook the IUD strings visaulized and grasped and pulled using ring forceps. The IUD was removed in its entirety. Patient tolerated the procedure well.    IUD Insertion Procedure Note Patient identified, informed consent performed, consent signed.   Discussed risks of irregular bleeding, infection, malpositioning or misplacement of the IUD outside the uterus which may require further procedure such as laparoscopy. Also discussed >99% contraception efficacy, increased risk of ectopic pregnancy with failure of method.  Time out was performed.  Urine pregnancy test negative.  Uterus already previously sounded to 11 cm.  Liletta IUD placed per manufacturer's recommendations.  Strings trimmed to 4 cm. Tenaculum was removed, good hemostasis noted.  Patient tolerated procedure well.   Patient was given post-procedure instructions. Patient was also asked to follow up in 4 weeks for IUD check.   Venora Maples, MD/MPH Attending Family Medicine Physician, [facility] for [facility], [facility]

## 2020-09-24 DIAGNOSIS — M25562 Pain in left knee: Secondary | ICD-10-CM | POA: Diagnosis not present

## 2020-09-24 DIAGNOSIS — E109 Type 1 diabetes mellitus without complications: Secondary | ICD-10-CM | POA: Diagnosis not present

## 2020-09-24 DIAGNOSIS — M5136 Other intervertebral disc degeneration, lumbar region: Secondary | ICD-10-CM | POA: Diagnosis not present

## 2020-09-25 ENCOUNTER — Telehealth: Payer: Self-pay | Admitting: Nurse Practitioner

## 2020-09-25 NOTE — Telephone Encounter (Signed)
Returned pt call and made her aware that I will print out a copy of the form and place it up front for her also made pt aware that she will have to wait till appt with Meredeth Ide for placard. Pt states she understand and pt will be by tomorrow or Monday to pick up copy

## 2020-09-25 NOTE — Telephone Encounter (Signed)
Pt has an appointment scheduled with provider on 10/20/20 and needs her Disability Parking Placard form completed. She also was asking about her medical clearance papers that she dropped of on 06/16/2020. Forms was not in provider's folder and she would like a copy of those papers.

## 2020-09-30 DIAGNOSIS — E6609 Other obesity due to excess calories: Secondary | ICD-10-CM | POA: Diagnosis not present

## 2020-09-30 DIAGNOSIS — Z6839 Body mass index (BMI) 39.0-39.9, adult: Secondary | ICD-10-CM | POA: Diagnosis not present

## 2020-09-30 DIAGNOSIS — Z713 Dietary counseling and surveillance: Secondary | ICD-10-CM | POA: Diagnosis not present

## 2020-09-30 DIAGNOSIS — Z7689 Persons encountering health services in other specified circumstances: Secondary | ICD-10-CM | POA: Diagnosis not present

## 2020-10-20 ENCOUNTER — Other Ambulatory Visit: Payer: Self-pay

## 2020-10-20 ENCOUNTER — Encounter: Payer: Self-pay | Admitting: Nurse Practitioner

## 2020-10-20 ENCOUNTER — Ambulatory Visit: Payer: Medicaid Other | Attending: Nurse Practitioner | Admitting: Nurse Practitioner

## 2020-10-20 VITALS — BP 127/84 | HR 105 | Resp 18 | Ht 65.0 in | Wt 236.0 lb

## 2020-10-20 DIAGNOSIS — H40033 Anatomical narrow angle, bilateral: Secondary | ICD-10-CM | POA: Diagnosis not present

## 2020-10-20 DIAGNOSIS — E785 Hyperlipidemia, unspecified: Secondary | ICD-10-CM | POA: Diagnosis not present

## 2020-10-20 DIAGNOSIS — D649 Anemia, unspecified: Secondary | ICD-10-CM

## 2020-10-20 DIAGNOSIS — J302 Other seasonal allergic rhinitis: Secondary | ICD-10-CM | POA: Diagnosis not present

## 2020-10-20 DIAGNOSIS — T782XXS Anaphylactic shock, unspecified, sequela: Secondary | ICD-10-CM

## 2020-10-20 DIAGNOSIS — E1149 Type 2 diabetes mellitus with other diabetic neurological complication: Secondary | ICD-10-CM | POA: Diagnosis not present

## 2020-10-20 DIAGNOSIS — Z760 Encounter for issue of repeat prescription: Secondary | ICD-10-CM | POA: Diagnosis not present

## 2020-10-20 DIAGNOSIS — Z1211 Encounter for screening for malignant neoplasm of colon: Secondary | ICD-10-CM

## 2020-10-20 MED ORDER — EPINEPHRINE 0.3 MG/0.3ML IJ SOAJ: 0.3000 mg | INTRAMUSCULAR | 1 refills | Status: DC | PRN

## 2020-10-20 MED ORDER — ALBUTEROL SULFATE HFA 108 (90 BASE) MCG/ACT IN AERS: 1.0000 | INHALATION_SPRAY | Freq: Four times a day (QID) | RESPIRATORY_TRACT | 1 refills | Status: DC | PRN

## 2020-10-20 NOTE — Progress Notes (Signed)
Pt stated that her concern is about the eye doctor to get an appointment and about changing her endocrinology doctor also fill out a form for handicap parking

## 2020-10-20 NOTE — Progress Notes (Signed)
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 Avoid red meat/beef/steak,  fried foods. junk foods, sodas, sugary drinks, unhealthy snacking, alcohol and smoking.  Drink at least 80 oz of water per day and monitor your carbohydrate intake daily.    Anemia, unspecified type -     CBC  Diabetes mellitus type 2 with neurological manifestations [facility]) -     Ambulatory referral to Ophthalmology -     CMP14+EGFR  Colon cancer screening -     Ambulatory referral to Gastroenterology  Anaphylaxis, sequela -     EPINEPHrine 0.3 mg/0.3 mL IJ SOAJ injection; Inject 0.3 mg into the muscle as needed for anaphylaxis.  Seasonal allergies -     albuterol (VENTOLIN HFA) 108 (90 Base) MCG/ACT inhaler; Inhale 1-2 puffs into the lungs every 6 (six) hours as needed for wheezing or shortness of breath.    Patient has been counseled on age-appropriate routine health concerns for screening and prevention. These are reviewed and up-to-date. Referrals have been placed accordingly. Immunizations are up-to-date or declined.    Subjective:   HPI Lindsey Bowen 50 y.o. female presents to office today for follow up. She has a past medical history of Anemia,  DM2,  Edema, Thyroid disorder, Lumbar radiculopathy, Onychodystrophy, Onychomycosis, Neuropathy, Sleep apnea,  TIA and glaucoma.    EYE PROBLEM Requesting referral to ophthalmology as she has a history of glaucoma and needs follow up. She can no longer see her current eye specialist due to insurance changes. I will place referral but also encouraged her to speak to her insurance company  regarding In network providers.   Essential Hypertension Well controlled with HCTZ 50 mg daily and lisinopril 5 mg daily. Denies chest pain, shortness of breath, palpitations, lightheadedness, dizziness, headaches or BLE edema.  BP Readings from Last 3 Encounters:  10/20/20 127/84  09/23/20 132/82  06/17/20 126/85   Lab Results  Component Value Date   LDLCALC 73 03/28/2020   GI She endorses bloating and excessive flatus ongoing for over a year. Dr. Hartford Poli (endo) reduced her Victoza from 1.8 to 1.2 mg daily. She was instructed to contact the office several weeks ago with update on symptoms. It does not appear she has done that. I will also refer her to GI for colonoscopy today. She has a past medical history of cholecystectomy and hernia repair  ENDO She sees Dr. Hartford Poli for her DM and Thyroid.   Review of Systems  Constitutional: Negative for fever, malaise/fatigue and weight loss.  HENT: Negative.  Negative for nosebleeds.   Eyes: Positive for blurred vision. Negative for double vision and photophobia.  Respiratory: Negative.  Negative for cough and shortness of breath.   Cardiovascular: Negative.  Negative for chest pain, palpitations and leg swelling.  Gastrointestinal: Negative for heartburn, nausea and vomiting.       SEE HPI  Musculoskeletal: Negative.  Negative for myalgias.  Neurological: Negative.  Negative for dizziness, focal weakness, seizures and headaches.  Psychiatric/Behavioral: Negative.  Negative for suicidal ideas.    Past Medical History:  Diagnosis Date  . Anemia   . Diabetes mellitus  type 2 with neurological manifestations ([place]) 05/27/2017  . Diabetes mellitus without complication ([place])   . Edema   . Hyperthyroidism   . Lumbar pain   . Onychodystrophy   . Onychomycosis   . Paresthesia 05/17/2017  . Sleep apnea   . Thyroid disease   . TIA (transient ischemic attack) 05/26/2017  . Type 2 diabetes mellitus with microalbuminuria ([place]) 05/27/2017    Past  Surgical History:  Procedure Laterality Date  . CHOLECYSTECTOMY    . DILATION AND CURETTAGE OF UTERUS    . HERNIA REPAIR      Family History  Problem Relation Age of Onset  . Diabetes Mother   . Hypertension Mother   . Heart attack Maternal Grandmother   . Stroke Maternal Grandmother   . Diabetes Maternal Grandfather   . Cancer Maternal Grandfather     Social History Reviewed with no changes to be made today.   Outpatient Medications Prior to Visit  Medication Sig Dispense Refill  . ACCU-CHEK GUIDE test strip USE TO CHECK BLOOD SUGAR 4 TIME(S) DAILY.    Marland Kitchen acetaminophen (TYLENOL) 650 MG CR tablet Take 650 mg by mouth every 8 (eight) hours as needed for pain.    . BD PEN NEEDLE NANO 2ND GEN 32G X 4 MM MISC ONE EACH BY OTHER ROUTE DAILY.    Marland Kitchen Blood Glucose Monitoring Suppl (ACCU-CHEK GUIDE ME) w/Device KIT USE AS DIRECTED TO MONITOR BLOOD SUGAR.    . cetirizine (ZYRTEC) 10 MG tablet TAKE 1 TABLET BY MOUTH EVERY DAY 90 tablet 0  . Continuous Blood Gluc Sensor (DEXCOM G6 SENSOR) MISC SMARTSIG:1 Each Topical Every 10 Days    . Continuous Blood Gluc Transmit (DEXCOM G6 TRANSMITTER) MISC     . cyclobenzaprine (FLEXERIL) 10 MG tablet TAKE 1 TABLET BY MOUTH EVERYDAY AT BEDTIME 30 tablet 0  . diclofenac sodium (VOLTAREN) 1 % GEL Apply 2 g topically 4 (four) times daily. Rub into affected area of foot 2 to 4 times daily 100 g 2  . fluticasone (FLONASE) 50 MCG/ACT nasal spray Place 2 sprays into both nostrils daily. 16 g 2  . hydrochlorothiazide (HYDRODIURIL) 50 MG tablet Take 50 mg by mouth daily.    Marland Kitchen levothyroxine (SYNTHROID, LEVOTHROID) 175 MCG tablet Take 1 tablet (175 mcg total) by mouth daily before breakfast. 30 tablet 0  . liraglutide (VICTOZA) 18 MG/3ML SOPN Inject 1.8 mg into the skin daily.    Marland Kitchen lisinopril (PRINIVIL,ZESTRIL) 5 MG tablet Take 5 mg by mouth daily.    . Multiple Vitamin (MULTIVITAMIN) tablet Take 1 tablet by mouth daily.    . naproxen sodium (ALEVE) 220 MG tablet Take  220 mg by mouth daily as needed (headache).    . rosuvastatin (CRESTOR) 20 MG tablet Take 20 mg by mouth at bedtime.    Marland Kitchen albuterol (VENTOLIN HFA) 108 (90 Base) MCG/ACT inhaler Inhale 1-2 puffs into the lungs every 6 (six) hours as needed for wheezing or shortness of breath. 18 g 1  . cephALEXin (KEFLEX) 500 MG capsule Take 1 capsule (500 mg total) by mouth 3 (three) times daily. 21 capsule 0  . insulin regular human CONCENTRATED (HUMULIN R U-500 KWIKPEN) 500 UNIT/ML kwikpen 70 units before breakfast, 60 units before lunch and 60 units before supper. (Patient not taking: Reported on 10/20/2020)    . DULoxetine 40 MG CPEP Take 40 mg by mouth daily. 30 capsule 1  . hydrochlorothiazide (HYDRODIURIL) 25 MG tablet Take 25 mg by mouth daily. (Patient not taking: Reported  on 10/20/2020)     No facility-administered medications prior to visit.    Allergies  Allergen Reactions  . Dust Mite Extract Hives and Itching  . Shellfish Allergy Anaphylaxis       Objective:    BP 127/84   Pulse (!) 105   Resp 18   Ht 5' 5"  (1.651 m)   Wt 236 lb (107 kg)   SpO2 96%   BMI 39.27 kg/m  Wt Readings from Last 3 Encounters:  10/20/20 236 lb (107 kg)  09/23/20 240 lb 12.8 oz (109.2 kg)  06/17/20 240 lb 3.2 oz (109 kg)    Physical Exam Vitals and nursing note reviewed.  Constitutional:      Appearance: She is well-developed.  HENT:     Head: Normocephalic and atraumatic.  Cardiovascular:     Rate and Rhythm: Regular rhythm. Tachycardia present.     Heart sounds: Normal heart sounds. No murmur heard. No friction rub. No gallop.   Pulmonary:     Effort: Pulmonary effort is normal. No tachypnea or respiratory distress.     Breath sounds: Normal breath sounds. No decreased breath sounds, wheezing, rhonchi or rales.  Chest:     Chest wall: No tenderness.  Abdominal:     General: Bowel sounds are normal.     Palpations: Abdomen is soft.  Musculoskeletal:        General: Normal range of motion.      Cervical back: Normal range of motion.  Skin:    General: Skin is warm and dry.  Neurological:     Mental Status: She is alert and oriented to person, place, and time.     Coordination: Coordination normal.  Psychiatric:        Behavior: Behavior normal. Behavior is cooperative.        Thought Content: Thought content normal.        Judgment: Judgment normal.          Patient has been counseled extensively about nutrition and exercise as well as the importance of adherence with medications and regular follow-up. The patient was given clear instructions to go to ER or return to medical center if symptoms don't improve, worsen or new problems develop. The patient verbalized understanding.   Follow-up: Return in about 6 months (around 04/21/2021).   Gildardo Pounds, FNP-BC [facility] and [place], [place]   10/20/2020, 8:57 PM

## 2020-10-21 ENCOUNTER — Telehealth: Payer: Self-pay | Admitting: Nurse Practitioner

## 2020-10-21 ENCOUNTER — Ambulatory Visit: Payer: Medicaid Other | Admitting: Family Medicine

## 2020-10-21 LAB — CMP14+EGFR
ALT: 31 IU/L (ref 0–32)
AST: 18 IU/L (ref 0–40)
Albumin/Globulin Ratio: 2 (ref 1.2–2.2)
Albumin: 4.6 g/dL (ref 3.8–4.8)
Alkaline Phosphatase: 100 IU/L (ref 44–121)
BUN/Creatinine Ratio: 8 — ABNORMAL LOW (ref 9–23)
BUN: 9 mg/dL (ref 6–24)
Bilirubin Total: 0.5 mg/dL (ref 0.0–1.2)
CO2: 21 mmol/L (ref 20–29)
Calcium: 9.7 mg/dL (ref 8.7–10.2)
Chloride: 98 mmol/L (ref 96–106)
Creatinine, Ser: 1.06 mg/dL — ABNORMAL HIGH (ref 0.57–1.00)
Globulin, Total: 2.3 g/dL (ref 1.5–4.5)
Glucose: 404 mg/dL — ABNORMAL HIGH (ref 65–99)
Potassium: 4 mmol/L (ref 3.5–5.2)
Sodium: 139 mmol/L (ref 134–144)
Total Protein: 6.9 g/dL (ref 6.0–8.5)
eGFR: 65 mL/min/{1.73_m2} (ref >59)

## 2020-10-21 LAB — LIPID PANEL
Chol/HDL Ratio: 5 ratio — ABNORMAL HIGH (ref 0.0–4.4)
Cholesterol, Total: 159 mg/dL (ref 100–199)
HDL: 32 mg/dL — ABNORMAL LOW (ref >39)
LDL Chol Calc (NIH): 102 mg/dL — ABNORMAL HIGH (ref 0–99)
Triglycerides: 142 mg/dL (ref 0–149)
VLDL Cholesterol Cal: 25 mg/dL (ref 5–40)

## 2020-10-21 LAB — CBC
Hematocrit: 39.8 % (ref 34.0–46.6)
Hemoglobin: 12.3 g/dL (ref 11.1–15.9)
MCH: 22.9 pg — ABNORMAL LOW (ref 26.6–33.0)
MCHC: 30.9 g/dL — ABNORMAL LOW (ref 31.5–35.7)
MCV: 74 fL — ABNORMAL LOW (ref 79–97)
Platelets: 287 10*3/uL (ref 150–450)
RBC: 5.37 x10E6/uL — ABNORMAL HIGH (ref 3.77–5.28)
RDW: 14 % (ref 11.7–15.4)
WBC: 6 10*3/uL (ref 3.4–10.8)

## 2020-10-21 NOTE — Telephone Encounter (Signed)
Copied from CRM [id number]. Topic: General - Inquiry >> Oct 20, 2020  5:03 PM Daphine Deutscher D wrote: Reason for CRM: Pt called saying the handicap sticker that she just had filled out is filled out incorrectly.  She wants to know if Dr. Mayo Ao can fill her out a new one.  Where the patients name is suppose to be she said Dr. Mayo Ao signed her own name  CB#  [phone]

## 2020-10-21 NOTE — Telephone Encounter (Signed)
Patient has made an additional phone call regarding the matter  Patient would like to be notified when the form is completed.  Please contact to further advise when possible

## 2020-10-21 NOTE — Telephone Encounter (Signed)
Pt requesting new form.

## 2020-10-22 NOTE — Telephone Encounter (Signed)
Contacted pt and left a detailed vm making pt aware that paperwork is ready for pickup

## 2020-10-22 NOTE — Telephone Encounter (Signed)
Form is ready for pick up.

## 2020-10-24 ENCOUNTER — Telehealth: Payer: Self-pay

## 2020-10-24 NOTE — Telephone Encounter (Signed)
Pt informed disability paperwork corrected and left at front desk for pick up.

## 2020-10-27 DIAGNOSIS — Z6839 Body mass index (BMI) 39.0-39.9, adult: Secondary | ICD-10-CM | POA: Diagnosis not present

## 2020-10-27 DIAGNOSIS — E6609 Other obesity due to excess calories: Secondary | ICD-10-CM | POA: Diagnosis not present

## 2020-10-27 DIAGNOSIS — Z713 Dietary counseling and surveillance: Secondary | ICD-10-CM | POA: Diagnosis not present

## 2020-10-27 DIAGNOSIS — R140 Abdominal distension (gaseous): Secondary | ICD-10-CM | POA: Diagnosis not present

## 2020-11-28 ENCOUNTER — Ambulatory Visit (INDEPENDENT_AMBULATORY_CARE_PROVIDER_SITE_OTHER): Payer: Medicaid Other | Admitting: *Deleted

## 2020-11-28 ENCOUNTER — Other Ambulatory Visit (HOSPITAL_COMMUNITY)
Admission: RE | Admit: 2020-11-28 | Discharge: 2020-11-28 | Disposition: A | Discharge status: Home / Self Care | Payer: Medicaid Other | Source: Ambulatory Visit | Attending: Family Medicine | Admitting: Family Medicine

## 2020-11-28 ENCOUNTER — Other Ambulatory Visit: Payer: Self-pay

## 2020-11-28 VITALS — BP 136/72 | HR 84 | Ht 64.5 in | Wt 237.6 lb

## 2020-11-28 DIAGNOSIS — N898 Other specified noninflammatory disorders of vagina: Secondary | ICD-10-CM

## 2020-11-28 MED ORDER — FLUCONAZOLE 150 MG PO TABS
150.0000 mg | ORAL_TABLET | Freq: Once | ORAL | 0 refills | Status: AC
Start: 2020-11-28 — End: 2020-11-28

## 2020-11-28 NOTE — Progress Notes (Signed)
C/o itching in perineal area and white vaginal discharge more than usual. Denies odor or pain with intercourse. Self swab obtained and patient desires full wet prep.  Offered patient to wait and treat results or go ahead and send in Diflucan for yeast . She requested RX sent in and she will look at results and take if + yeast. We also discussed she will be contacted if anything else is positive and needs treatment. She voices understanding. Emmaclaire Switala,RN

## 2020-12-01 LAB — CERVICOVAGINAL ANCILLARY ONLY
Bacterial Vaginitis (gardnerella): POSITIVE — AB
Candida Glabrata: NEGATIVE
Candida Vaginitis: POSITIVE — AB
Chlamydia: NEGATIVE
Comment: NEGATIVE
Comment: NEGATIVE
Comment: NEGATIVE
Comment: NEGATIVE
Comment: NEGATIVE
Comment: NORMAL
Neisseria Gonorrhea: NEGATIVE
Trichomonas: NEGATIVE

## 2020-12-02 ENCOUNTER — Other Ambulatory Visit: Payer: Self-pay | Admitting: Obstetrics and Gynecology

## 2020-12-02 MED ORDER — METRONIDAZOLE 500 MG PO TABS: 500.0000 mg | ORAL_TABLET | Freq: Two times a day (BID) | ORAL | 0 refills | Status: AC

## 2020-12-12 NOTE — Progress Notes (Signed)
Patient was assessed and managed by nursing staff during this encounter. I have reviewed the chart and agree with the documentation and plan. I have also made any necessary editorial changes.  Hopewell Bing, MD 12/12/2020 3:18 PM

## 2020-12-31 ENCOUNTER — Encounter: Payer: Self-pay | Admitting: Gastroenterology

## 2020-12-31 DIAGNOSIS — E1165 Type 2 diabetes mellitus with hyperglycemia: Secondary | ICD-10-CM | POA: Diagnosis not present

## 2020-12-31 DIAGNOSIS — E1129 Type 2 diabetes mellitus with other diabetic kidney complication: Secondary | ICD-10-CM | POA: Diagnosis not present

## 2020-12-31 DIAGNOSIS — E039 Hypothyroidism, unspecified: Secondary | ICD-10-CM | POA: Diagnosis not present

## 2020-12-31 DIAGNOSIS — I152 Hypertension secondary to endocrine disorders: Secondary | ICD-10-CM | POA: Diagnosis not present

## 2020-12-31 DIAGNOSIS — E785 Hyperlipidemia, unspecified: Secondary | ICD-10-CM | POA: Diagnosis not present

## 2020-12-31 DIAGNOSIS — E1169 Type 2 diabetes mellitus with other specified complication: Secondary | ICD-10-CM | POA: Diagnosis not present

## 2020-12-31 DIAGNOSIS — Z794 Long term (current) use of insulin: Secondary | ICD-10-CM | POA: Diagnosis not present

## 2020-12-31 DIAGNOSIS — E1159 Type 2 diabetes mellitus with other circulatory complications: Secondary | ICD-10-CM | POA: Diagnosis not present

## 2020-12-31 DIAGNOSIS — R809 Proteinuria, unspecified: Secondary | ICD-10-CM | POA: Diagnosis not present

## 2021-01-03 DIAGNOSIS — Z1231 Encounter for screening mammogram for malignant neoplasm of breast: Secondary | ICD-10-CM | POA: Diagnosis not present

## 2021-01-14 DIAGNOSIS — E1165 Type 2 diabetes mellitus with hyperglycemia: Secondary | ICD-10-CM | POA: Diagnosis not present

## 2021-01-14 DIAGNOSIS — I10 Essential (primary) hypertension: Secondary | ICD-10-CM | POA: Diagnosis not present

## 2021-01-14 DIAGNOSIS — E669 Obesity, unspecified: Secondary | ICD-10-CM | POA: Diagnosis not present

## 2021-01-14 DIAGNOSIS — E1169 Type 2 diabetes mellitus with other specified complication: Secondary | ICD-10-CM | POA: Diagnosis not present

## 2021-01-14 DIAGNOSIS — E1129 Type 2 diabetes mellitus with other diabetic kidney complication: Secondary | ICD-10-CM | POA: Diagnosis not present

## 2021-01-14 DIAGNOSIS — G4730 Sleep apnea, unspecified: Secondary | ICD-10-CM | POA: Diagnosis not present

## 2021-01-14 DIAGNOSIS — Z794 Long term (current) use of insulin: Secondary | ICD-10-CM | POA: Diagnosis not present

## 2021-01-14 DIAGNOSIS — E039 Hypothyroidism, unspecified: Secondary | ICD-10-CM | POA: Diagnosis not present

## 2021-01-14 DIAGNOSIS — R809 Proteinuria, unspecified: Secondary | ICD-10-CM | POA: Diagnosis not present

## 2021-01-14 DIAGNOSIS — Z6839 Body mass index (BMI) 39.0-39.9, adult: Secondary | ICD-10-CM | POA: Diagnosis not present

## 2021-01-14 DIAGNOSIS — E785 Hyperlipidemia, unspecified: Secondary | ICD-10-CM | POA: Diagnosis not present

## 2021-01-29 ENCOUNTER — Other Ambulatory Visit (INDEPENDENT_AMBULATORY_CARE_PROVIDER_SITE_OTHER): Payer: Medicaid Other

## 2021-01-29 ENCOUNTER — Ambulatory Visit: Payer: Medicaid Other | Admitting: Gastroenterology

## 2021-01-29 ENCOUNTER — Encounter: Payer: Self-pay | Admitting: Gastroenterology

## 2021-01-29 VITALS — BP 120/74 | HR 84 | Ht 64.0 in | Wt 243.2 lb

## 2021-01-29 DIAGNOSIS — Z1212 Encounter for screening for malignant neoplasm of rectum: Secondary | ICD-10-CM

## 2021-01-29 DIAGNOSIS — Z1211 Encounter for screening for malignant neoplasm of colon: Secondary | ICD-10-CM | POA: Diagnosis not present

## 2021-01-29 DIAGNOSIS — D649 Anemia, unspecified: Secondary | ICD-10-CM

## 2021-01-29 DIAGNOSIS — R140 Abdominal distension (gaseous): Secondary | ICD-10-CM | POA: Diagnosis not present

## 2021-01-29 LAB — CBC WITH DIFFERENTIAL/PLATELET
Basophils Absolute: 0 10*3/uL (ref 0.0–0.1)
Basophils Relative: 0.2 % (ref 0.0–3.0)
Eosinophils Absolute: 0.2 10*3/uL (ref 0.0–0.7)
Eosinophils Relative: 2.3 % (ref 0.0–5.0)
HCT: 36.8 % (ref 36.0–46.0)
Hemoglobin: 11.7 g/dL — ABNORMAL LOW (ref 12.0–15.0)
Lymphocytes Relative: 37.5 % (ref 12.0–46.0)
Lymphs Abs: 2.8 10*3/uL (ref 0.7–4.0)
MCHC: 31.8 g/dL (ref 30.0–36.0)
MCV: 72.3 fl — ABNORMAL LOW (ref 78.0–100.0)
Monocytes Absolute: 0.4 10*3/uL (ref 0.1–1.0)
Monocytes Relative: 5.9 % (ref 3.0–12.0)
Neutro Abs: 4 10*3/uL (ref 1.4–7.7)
Neutrophils Relative %: 54.1 % (ref 43.0–77.0)
Platelets: 275 10*3/uL (ref 150.0–400.0)
RBC: 5.08 Mil/uL (ref 3.87–5.11)
RDW: 14.1 % (ref 11.5–15.5)
WBC: 7.4 10*3/uL (ref 4.0–10.5)

## 2021-01-29 LAB — IBC + FERRITIN
Ferritin: 59.7 ng/mL (ref 10.0–291.0)
Iron: 52 ug/dL (ref 42–145)
Saturation Ratios: 13.8 % — ABNORMAL LOW (ref 20.0–50.0)
Transferrin: 270 mg/dL (ref 212.0–360.0)

## 2021-01-29 LAB — FERRITIN: Ferritin: 59.7 ng/mL (ref 10.0–291.0)

## 2021-01-29 NOTE — Progress Notes (Signed)
HPI : Lindsey Bowen is a pleasant 50 year old female with diabetes, OSA, obesity and hypothyroidism who is referred to [place] by Geryl Rankins, NP for initial screening colonoscopy and chronic abdominal discomfort.  The patient states she has had problems with bloating and gas and abdominal discomfort for several years now.  Her bloating is noted both after meals and on an empty stomach, but is often worse after meals.  She used to have problems with chronic constipation as well, but she started taking cinnamon pills 3 weeks ago per a friend's recommendation and this seems to have helped.  She is currently having 3 Bms/day on average, compared to having a bowel movement every 2-3 days previously.  Her stools are formed, brown and soft.  She denies straining or seeing blood in her stool.  She denies diarrhea. The improvement in her bowel habits has not significantly improved her bloating and abdominal discomfort, however. She denies weight loss, rather her weight is going up and she is considering bariatric surgery.  She has had diabetes for 10 years, and initially controlled it well.  However, her control as been poor the past 6 years, with her last A1c 9.2. She denies chronic upper GI symptoms such as heartburn, acid regurgitation, nausea/vomiting or dysphagia. 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     Past Surgical History:  Procedure  Laterality Date   CHOLECYSTECTOMY     DILATION AND CURETTAGE OF UTERUS     HERNIA REPAIR     Family History  Problem Relation Age of Onset   Diabetes Mother    Hypertension Mother    Addison's disease Mother    Asthma Mother    Colon polyps Mother    Irritable bowel syndrome Mother    Glaucoma Father    Mental retardation Sister    Glaucoma Sister    Heart attack Maternal Grandmother    Stroke Maternal Grandmother    Diabetes Maternal Grandfather    Stomach cancer Maternal Grandfather    Diabetes Paternal Grandmother    Heart attack Maternal Aunt    Heart attack Maternal Uncle    Diabetes Son    Irritable bowel syndrome Daughter    Diabetes Paternal Uncle    Social History   Tobacco Use   Smoking status: Never   Smokeless tobacco: Never  Vaping Use   Vaping Use: Never used  Substance Use Topics   Alcohol use: No   Drug use: Not Currently   Current Outpatient Medications  Medication Sig Dispense Refill   ACCU-CHEK GUIDE test strip USE TO CHECK BLOOD SUGAR 4 TIME(S) DAILY.     acetaminophen (TYLENOL) 650 MG CR tablet Take 650 mg by mouth every 8 (eight) hours as needed for pain.     albuterol (VENTOLIN HFA) 108 (90 Base) MCG/ACT inhaler Inhale  1-2 puffs into the lungs every 6 (six) hours as needed for wheezing or shortness of breath. 18 g 1   BD PEN NEEDLE NANO 2ND GEN 32G X 4 MM MISC ONE EACH BY OTHER ROUTE DAILY.     Blood Glucose Monitoring Suppl (ACCU-CHEK GUIDE ME) w/Device KIT USE AS DIRECTED TO MONITOR BLOOD SUGAR.     cetirizine (ZYRTEC) 10 MG tablet TAKE 1 TABLET BY MOUTH EVERY DAY 90 tablet 0   Continuous Blood Gluc Sensor (DEXCOM G6 SENSOR) MISC      Continuous Blood Gluc Transmit (DEXCOM G6 TRANSMITTER) MISC      cyclobenzaprine (FLEXERIL) 10 MG tablet TAKE 1 TABLET BY MOUTH EVERYDAY AT BEDTIME 30 tablet 0   fluticasone (FLONASE) 50 MCG/ACT nasal spray Place 2 sprays into both nostrils daily. 16 g 2   hydrochlorothiazide (HYDRODIURIL) 50 MG tablet Take 50  mg by mouth daily.     insulin regular human CONCENTRATED (HUMULIN R U-500 KWIKPEN) 500 UNIT/ML kwikpen      levocetirizine (XYZAL) 5 MG tablet Take 5 mg by mouth every evening.     levothyroxine (SYNTHROID, LEVOTHROID) 175 MCG tablet Take 1 tablet (175 mcg total) by mouth daily before breakfast. 30 tablet 0   lisinopril (PRINIVIL,ZESTRIL) 5 MG tablet Take 5 mg by mouth daily.     Multiple Vitamin (MULTIVITAMIN) tablet Take 1 tablet by mouth daily.     naproxen sodium (ALEVE) 220 MG tablet Take 220 mg by mouth daily as needed (headache).     rosuvastatin (CRESTOR) 20 MG tablet Take 20 mg by mouth at bedtime.     EPINEPHrine 0.3 mg/0.3 mL IJ SOAJ injection Inject 0.3 mg into the muscle as needed for anaphylaxis. (Patient not taking: No sig reported) 1 each 1   No current facility-administered medications for this visit.   Allergies  Allergen Reactions   Dust Mite Extract Hives and Itching   Shellfish Allergy Anaphylaxis     Review of Systems: All systems reviewed and negative except where noted in HPI.    No results found.  Physical Exam: BP 120/74 (BP Location: Left Arm, Patient Position: Sitting, Cuff Size: Large)   Pulse 84   Ht 5' 4"  (1.626 m) Comment: height measured without shoes  Wt 243 lb 4 oz (110.3 kg)   BMI 41.75 kg/m  Constitutional: Pleasant,well-developed obese African American female in no acute distress. HEENT: Normocephalic and atraumatic. Conjunctivae are normal. No scleral icterus.  Mallampati 3 Cardiovascular: Normal rate, regular rhythm.  Pulmonary/chest: Effort normal and breath sounds normal. No wheezing, rales or rhonchi. Abdominal: Soft, nondistended, nontender. Bowel sounds active throughout. There are no masses palpable. No hepatomegaly. Extremities: no edema Neurological: Alert and oriented to person place and time. Skin: Skin is warm and dry. No rashes noted. Psychiatric: Normal mood and affect. Behavior is normal.  CBC    Component Value  Date/Time   WBC 6.0 10/20/2020 1646   WBC 5.6 05/27/2017 0426   RBC 5.37 (H) 10/20/2020 1646   RBC 4.96 05/27/2017 0426   RBC 4.87 05/27/2017 0344   HGB 12.3 10/20/2020 1646   HCT 39.8 10/20/2020 1646   PLT 287 10/20/2020 1646   MCV 74 (L) 10/20/2020 1646   MCH 22.9 (L) 10/20/2020 1646   MCH 22.8 (L) 05/27/2017 0426   MCHC 30.9 (L) 10/20/2020 1646   MCHC 31.6 05/27/2017 0426   RDW 14.0 10/20/2020 1646   LYMPHSABS 2.2 05/26/2017 1337   MONOABS 0.2 05/26/2017 1337   EOSABS 0.2 05/26/2017 1337  BASOSABS 0.0 05/26/2017 1337    CMP     Component Value Date/Time   NA 139 10/20/2020 1646   K 4.0 10/20/2020 1646   CL 98 10/20/2020 1646   CO2 21 10/20/2020 1646   GLUCOSE 404 (H) 10/20/2020 1646   GLUCOSE 224 (H) 05/26/2017 1355   BUN 9 10/20/2020 1646   CREATININE 1.06 (H) 10/20/2020 1646   CALCIUM 9.7 10/20/2020 1646   PROT 6.9 10/20/2020 1646   ALBUMIN 4.6 10/20/2020 1646   AST 18 10/20/2020 1646   ALT 31 10/20/2020 1646   ALKPHOS 100 10/20/2020 1646   BILITOT 0.5 10/20/2020 1646   GFRNONAA 61 03/28/2020 1702   GFRAA 71 03/28/2020 1702     ASSESSMENT AND PLAN: 50 year old female with obesity and poorly controlled diabetes with chronic bloating, gas and abdominal discomfort.  Previously she had problems with constipation, but this has resolved since taking a cinnamon supplement daily for the past 3 weeks. No red flag symptoms.  I suspect her symptoms are likely from IBS, but with her poorly controlled diabetes, SIBO and gastroparesis are also possible. We discussed how poorly controlled blood sugars can cause GI symptoms as well as how they can permanently affect the enteric nervous system.  We also discussed the pathophysiology of IBS and the principles of IBS management to include dietary modifications, medications and cognitive therapies. She has a chronic microcytosis and borderline anemia and she has never had an upper or lower endoscopy.  I recommended we perform an  upper endoscopy to assess for H. Pylori, celiac disease, PUD as causes of her symptoms as well as her presumed iron deficiency, especially since she is considering bariatric surgery. She is also due for her initial screening colonoscopy. Will obtain H. Pylori stool antigen and celiac serologies and repeat an iron panel.  For her symptoms, I recommended she try Bentyl for now and focus on improving her glycemic control.  If no underlying causes are found and her symptoms are not improving, I would recommend hydrogen breath testing to evaluate for SIBO.  The details, risks (including bleeding, perforation, infection, missed lesions, medication reactions and possible hospitalization or surgery if complications occur), benefits, and alternatives to EGD/colonoscopy with possible biopsy and possible polypectomy were discussed with the patient and she consents to proceed.   I spent a total of 45 minutes reviewing the patient's medical record, interviewing and examining the patient, discussing her diagnosis and management of her condition going forward, and documenting in the medical record  Lindsey Bowen E. Candis Schatz, MD [facility]      CC: Gildardo Pounds, NP

## 2021-01-29 NOTE — Patient Instructions (Signed)
If you are age 48 or older, your body mass index should be between 23-30. Your Body mass index is 41.75 kg/m. If this is out of the aforementioned range listed, please consider follow up with your Primary Care Provider.  If you are age 71 or younger, your body mass index should be between 19-25. Your Body mass index is 41.75 kg/m. If this is out of the aformentioned range listed, please consider follow up with your Primary Care Provider.   Your provider has requested that you go to the basement level for lab work before leaving today. Press "B" on the elevator. The lab is located at the first door on the left as you exit the elevator.  You have been scheduled for an endoscopy and colonoscopy. Please follow the written instructions given to you at your visit today. Please pick up your prep supplies at the pharmacy within the next 1-3 days. If you use inhalers (even only as needed), please bring them with you on the day of your procedure.  __________________________________________________________  The [facility] providers would like to encourage you to use [facility] to communicate with providers for non-urgent requests or questions.  Due to long hold times on the telephone, sending your provider a message by [facility] may be a faster and more efficient way to get a response.  Please allow 48 business hours for a response.  Please remember that this is for non-urgent requests.   Due to recent changes in healthcare laws, you may see the results of your imaging and laboratory studies on MyChart before your provider has had a chance to review them.  We understand that in some cases there may be results that are confusing or concerning to you. Not all laboratory results come back in the same time frame and the provider may be waiting for multiple results in order to interpret others.  Please give [place] 48 hours in order for your provider to thoroughly review all the results before contacting the office for  clarification of your results.   It was a pleasure to see you today!  Thank you for trusting me with your gastrointestinal care!    Scott E. Tomasa Rand, MD

## 2021-01-30 LAB — IGA: Immunoglobulin A: 196 mg/dL (ref 47–310)

## 2021-01-30 LAB — TISSUE TRANSGLUTAMINASE, IGA: (tTG) Ab, IgA: <1 U/mL

## 2021-01-31 ENCOUNTER — Encounter: Payer: Self-pay | Admitting: Gastroenterology

## 2021-02-03 NOTE — Progress Notes (Signed)
Lindsey Bowen, your iron tests were suggestive of very mild iron deficiency which may improve with oral iron supplementation.  People often feel better overall when their iron levels are normalized, but often the iron pills cause a lot of GI symptoms such as constipation, bloating, hard stools and nausea.  I will write a prescription and recommend you try it out for a week or so to see how well you tolerate it.  The tests for celiac disease were negative.  We have not received the results from H. Pylori stool test.

## 2021-02-24 DIAGNOSIS — E1165 Type 2 diabetes mellitus with hyperglycemia: Secondary | ICD-10-CM | POA: Diagnosis not present

## 2021-02-24 DIAGNOSIS — G4730 Sleep apnea, unspecified: Secondary | ICD-10-CM | POA: Diagnosis not present

## 2021-02-24 DIAGNOSIS — R809 Proteinuria, unspecified: Secondary | ICD-10-CM | POA: Diagnosis not present

## 2021-02-24 DIAGNOSIS — E1129 Type 2 diabetes mellitus with other diabetic kidney complication: Secondary | ICD-10-CM | POA: Diagnosis not present

## 2021-02-24 DIAGNOSIS — Z8673 Personal history of transient ischemic attack (TIA), and cerebral infarction without residual deficits: Secondary | ICD-10-CM | POA: Diagnosis not present

## 2021-02-24 DIAGNOSIS — M5442 Lumbago with sciatica, left side: Secondary | ICD-10-CM | POA: Diagnosis not present

## 2021-02-24 DIAGNOSIS — E039 Hypothyroidism, unspecified: Secondary | ICD-10-CM | POA: Diagnosis not present

## 2021-02-24 DIAGNOSIS — Z794 Long term (current) use of insulin: Secondary | ICD-10-CM | POA: Diagnosis not present

## 2021-02-24 DIAGNOSIS — E785 Hyperlipidemia, unspecified: Secondary | ICD-10-CM | POA: Diagnosis not present

## 2021-02-24 DIAGNOSIS — I10 Essential (primary) hypertension: Secondary | ICD-10-CM | POA: Diagnosis not present

## 2021-02-24 DIAGNOSIS — E611 Iron deficiency: Secondary | ICD-10-CM | POA: Diagnosis not present

## 2021-03-05 ENCOUNTER — Encounter: Payer: Medicaid Other | Admitting: Gastroenterology

## 2021-03-13 ENCOUNTER — Encounter: Payer: Self-pay | Admitting: Gastroenterology

## 2021-03-13 ENCOUNTER — Ambulatory Visit (AMBULATORY_SURGERY_CENTER): Payer: Medicaid Other | Admitting: Gastroenterology

## 2021-03-13 VITALS — BP 109/79 | HR 88 | Temp 97.8°F | Resp 12 | Ht 64.0 in | Wt 243.0 lb

## 2021-03-13 DIAGNOSIS — Z1211 Encounter for screening for malignant neoplasm of colon: Secondary | ICD-10-CM | POA: Diagnosis not present

## 2021-03-13 DIAGNOSIS — R140 Abdominal distension (gaseous): Secondary | ICD-10-CM | POA: Diagnosis not present

## 2021-03-13 DIAGNOSIS — Z1212 Encounter for screening for malignant neoplasm of rectum: Secondary | ICD-10-CM

## 2021-03-13 DIAGNOSIS — K319 Disease of stomach and duodenum, unspecified: Secondary | ICD-10-CM | POA: Diagnosis not present

## 2021-03-13 MED ORDER — SODIUM CHLORIDE 0.9 % IV SOLN: 500.0000 mL | Freq: Once | INTRAVENOUS | Status: DC

## 2021-03-13 NOTE — Progress Notes (Signed)
Sedate, gd SR, tolerated procedure well, VSS, report to RN 

## 2021-03-13 NOTE — Patient Instructions (Signed)
Read all of the handouts given to you by your recovery room nurse. ? ?YOU HAD AN ENDOSCOPIC PROCEDURE TODAY AT THE [facility]:   Refer to the procedure report that was given to you for any specific questions about what was found during the examination.  If the procedure report does not answer your questions, please call your gastroenterologist to clarify.  If you requested that your care partner not be given the details of your procedure findings, then the procedure report has been included in a sealed envelope for you to review at your convenience later. ? ?YOU SHOULD EXPECT: Some feelings of bloating in the abdomen. Passage of more gas than usual.  Walking can help get rid of the air that was put into your GI tract during the procedure and reduce the bloating. If you had a lower endoscopy (such as a colonoscopy or flexible sigmoidoscopy) you may notice spotting of blood in your stool or on the toilet paper. If you underwent a bowel prep for your procedure, you may not have a normal bowel movement for a few days. ? ?Please Note:  You might notice some irritation and congestion in your nose or some drainage.  This is from the oxygen used during your procedure.  There is no need for concern and it should clear up in a day or so. ? ?SYMPTOMS TO REPORT IMMEDIATELY: ? ?Following lower endoscopy (colonoscopy or flexible sigmoidoscopy): ? Excessive amounts of blood in the stool ? Significant tenderness or worsening of abdominal pains ? Swelling of the abdomen that is new, acute ? Fever of 100?F or higher ? ?Following upper endoscopy (EGD) ? Vomiting of blood or coffee ground material ? New chest pain or pain under the shoulder blades ? Painful or persistently difficult swallowing ? New shortness of breath ? Fever of 100?F or higher ? Black, tarry-looking stools ? ?For urgent or emergent issues, a gastroenterologist can be reached at any hour by calling [phone]. ?Do not use MyChart messaging for urgent  concerns.  ? ? ?DIET:  We do recommend a small meal at first, but then you may proceed to your regular diet.  Drink plenty of fluids but you should avoid alcoholic beverages for 24 hours. ? ?ACTIVITY:  You should plan to take it easy for the rest of today and you should NOT DRIVE or use heavy machinery until tomorrow (because of the sedation medicines used during the test).   ? ?FOLLOW UP: ?Our staff will call the number listed on your records 48-72 hours following your procedure to check on you and address any questions or concerns that you may have regarding the information given to you following your procedure. If we do not reach you, we will leave a message.  We will attempt to reach you two times.  During this call, we will ask if you have developed any symptoms of COVID 19. If you develop any symptoms (ie: fever, flu-like symptoms, shortness of breath, cough etc.) before then, please call [phone].  If you test positive for Covid 19 in the 2 weeks post procedure, please call and report this information to us.   ? ?If any biopsies were taken you will be contacted by phone or by letter within the next 1-3 weeks.  Please call us at [phone] if you have not heard about the biopsies in 3 weeks.  ? ? ?SIGNATURES/CONFIDENTIALITY: ?You and/or your care partner have signed paperwork which will be entered into your electronic medical record.    These signatures attest to the fact that that the information above on your After Visit Summary has been reviewed and is understood.  Full responsibility of the confidentiality of this discharge information lies with you and/or your care-partner.  ?

## 2021-03-13 NOTE — Progress Notes (Signed)
[facility] History and Physical   Primary Care Physician:  Gildardo Pounds, NP   Reason for Procedure:   Abdominal pain/Bloating, colon cancer screening, microcytic anemia  Plan:    Diagnostic EGD/Screening colonoscopy     HPI: Lindsey Bowen is a 50 y.o. female who I saw in clinic on Aug 4th for chronic symptoms of abdominal pain, gas dyspepsia and bloating.  She has borderline low hemoglobin with a low MCV.  She is considering bariatric surgery.  No significant change in her symptoms since then, but the abdominal pain has improved with peppermint oil.   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 Authorizing Provider  ACCU-CHEK GUIDE test strip USE TO CHECK BLOOD SUGAR 4 TIME(S) DAILY. 08/02/19  Yes [provider]  acetaminophen (TYLENOL) 650 MG CR tablet Take 650 mg by mouth every 8 (eight) hours as needed for pain.   Yes [provider]  BD PEN NEEDLE NANO 2ND GEN 32G X 4 MM MISC ONE EACH BY OTHER ROUTE DAILY. 05/18/19  Yes [provider]  Blood Glucose Monitoring Suppl (ACCU-CHEK GUIDE ME) w/Device KIT USE AS DIRECTED TO MONITOR BLOOD SUGAR. 03/13/19  Yes [provider]  Continuous Blood Gluc Sensor (DEXCOM G6 SENSOR) MISC  06/09/20  Yes [provider]  Continuous Blood Gluc Transmit (DEXCOM G6 TRANSMITTER) [place]  02/23/20  Yes [provider]  insulin regular human CONCENTRATED (HUMULIN R U-500 KWIKPEN) 500 UNIT/ML kwikpen  11/29/19  Yes [provider]  levothyroxine (SYNTHROID, LEVOTHROID) 175 MCG tablet Take 1 tablet (175 mcg total) by mouth daily before breakfast. 05/28/17  Yes Bonnita Hollow, MD  lisinopril (PRINIVIL,ZESTRIL) 5 MG tablet Take 5 mg by mouth daily.   Yes [provider]  Multiple Vitamin (MULTIVITAMIN) tablet Take 1 tablet by mouth daily.   Yes [provider]  rosuvastatin (CRESTOR) 20 MG tablet Take 20 mg by mouth at bedtime. 06/27/20  Yes [provider]  albuterol (VENTOLIN HFA) 108 (90 Base) MCG/ACT inhaler Inhale 1-2 puffs into the lungs every 6 (six) hours as needed for wheezing or shortness of breath. Patient not taking: Reported on 03/13/2021 10/20/20   Gildardo Pounds, NP  cetirizine (ZYRTEC) 10 MG tablet TAKE 1 TABLET BY MOUTH EVERY DAY Patient not taking: Reported on 03/13/2021 02/02/19   Charlott Rakes, MD  cyclobenzaprine (FLEXERIL) 10 MG tablet TAKE 1 TABLET BY MOUTH EVERYDAY AT BEDTIME Patient not taking: Reported on 03/13/2021 12/01/19   Gildardo Pounds, NP  EPINEPHrine 0.3 mg/0.3 mL IJ SOAJ injection Inject 0.3 mg into the muscle as needed for anaphylaxis. Patient not taking: No sig reported 10/20/20  Gildardo Pounds, NP  fluticasone (FLONASE) 50 MCG/ACT nasal spray Place 2 sprays into both nostrils daily. Patient not taking: Reported on 03/13/2021 08/11/18   [place] Picket, MD  hydrochlorothiazide (HYDRODIURIL) 50 MG tablet Take 50 mg by mouth daily. Patient not taking: Reported on 03/13/2021 08/08/20   [provider]  levocetirizine (XYZAL) 5 MG tablet Take 5 mg by mouth every evening. Patient not taking: Reported on 03/13/2021    [provider]  naproxen sodium (ALEVE) 220 MG tablet Take 220 mg by mouth daily as needed  (headache). Patient not taking: Reported on 03/13/2021    [provider]    Current Outpatient Medications  Medication Sig Dispense Refill   ACCU-CHEK GUIDE test strip USE TO CHECK BLOOD SUGAR 4 TIME(S) DAILY.     acetaminophen (TYLENOL) 650 MG CR tablet Take 650 mg by mouth every 8 (eight) hours as needed for pain.     BD PEN NEEDLE NANO 2ND GEN 32G X 4 MM MISC ONE EACH BY OTHER ROUTE DAILY.     Blood Glucose Monitoring Suppl (ACCU-CHEK GUIDE ME) w/Device KIT USE AS DIRECTED TO MONITOR BLOOD SUGAR.     Continuous Blood Gluc Sensor (DEXCOM G6 SENSOR) MISC      Continuous Blood Gluc Transmit (DEXCOM G6 TRANSMITTER) MISC      insulin regular human CONCENTRATED (HUMULIN R U-500 KWIKPEN) 500 UNIT/ML kwikpen      levothyroxine (SYNTHROID, LEVOTHROID) 175 MCG tablet Take 1 tablet (175 mcg total) by mouth daily before breakfast. 30 tablet 0   lisinopril (PRINIVIL,ZESTRIL) 5 MG tablet Take 5 mg by mouth daily.     Multiple Vitamin (MULTIVITAMIN) tablet Take 1 tablet by mouth daily.     rosuvastatin (CRESTOR) 20 MG tablet Take 20 mg by mouth at bedtime.     albuterol (VENTOLIN HFA) 108 (90 Base) MCG/ACT inhaler Inhale 1-2 puffs into the lungs every 6 (six) hours as needed for wheezing or shortness of breath. (Patient not taking: Reported on 03/13/2021) 18 g 1   cetirizine (ZYRTEC) 10 MG tablet TAKE 1 TABLET BY MOUTH EVERY DAY (Patient not taking: Reported on 03/13/2021) 90 tablet 0   cyclobenzaprine (FLEXERIL) 10 MG tablet TAKE 1 TABLET BY MOUTH EVERYDAY AT BEDTIME (Patient not taking: Reported on 03/13/2021) 30 tablet 0   EPINEPHrine 0.3 mg/0.3 mL IJ SOAJ injection Inject 0.3 mg into the muscle as needed for anaphylaxis. (Patient not taking: No sig reported) 1 each 1   fluticasone (FLONASE) 50 MCG/ACT nasal spray Place 2 sprays into both nostrils daily. (Patient not taking: Reported on 03/13/2021) 16 g 2   hydrochlorothiazide (HYDRODIURIL) 50 MG tablet Take 50 mg by mouth daily. (Patient not  taking: Reported on 03/13/2021)     levocetirizine (XYZAL) 5 MG tablet Take 5 mg by mouth every evening. (Patient not taking: Reported on 03/13/2021)     naproxen sodium (ALEVE) 220 MG tablet Take 220 mg by mouth daily as needed (headache). (Patient not taking: Reported on 03/13/2021)     Current Facility-Administered Medications  Medication Dose Route Frequency Provider Last Rate Last Admin   0.9 %  sodium chloride infusion  500 mL Intravenous Once Daryel November, MD        Allergies as of 03/13/2021 - Review Complete 03/13/2021  Allergen Reaction Noted   Dust mite extract Hives and Itching 10/15/2016   Shellfish allergy Anaphylaxis 08/26/2015    Family History  Problem Relation Age of Onset   Diabetes Mother    Hypertension Mother  Addison's disease Mother    Asthma Mother    Colon polyps Mother    Irritable bowel syndrome Mother    Glaucoma Father    Mental retardation Sister    Glaucoma Sister    Heart attack Maternal Aunt    Heart attack Maternal Uncle    Diabetes Paternal Uncle    Heart attack Maternal Grandmother    Stroke Maternal Grandmother    Ulcerative colitis Maternal Grandfather    Diabetes Maternal Grandfather    Stomach cancer Maternal Grandfather    Diabetes Paternal Grandmother    Irritable bowel syndrome Daughter    Diabetes Son    Colon cancer Neg Hx    Esophageal cancer Neg Hx    Rectal cancer Neg Hx     Social History   Socioeconomic History   Marital status: Single    Spouse name: Not on file   Number of children: 3   Years of education: Not on file   Highest education level: Not on file  Occupational History   Occupation: [employer]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  Other Topics Concern   Not on file  Social  History Narrative   Lives    Caffeine use:    Social Determinants of Health   Financial Resource Strain: Not on file  Food Insecurity: No Food Insecurity   Worried About Charity fundraiser in the Last Year: Never true   Ran Out of Food in the Last Year: Never true  Transportation Needs: No Transportation Needs   Lack of Transportation (Medical): No   Lack of Transportation (Non-Medical): No  Physical Activity: Not on file  Stress: Not on file  Social Connections: Not on file  Intimate Partner Violence: Not on file    Review of Systems:  All other review of systems negative except as mentioned in the HPI.  Physical Exam: Vital signs BP 115/63   Pulse 78   Temp 97.8 F (36.6 C)   Ht _0  (1.626 m)   Wt 243 lb (110.2 kg)   SpO2 97%   BMI 41.71 kg/m   General:   Alert,  Well-developed, well-nourished, pleasant and cooperative in NAD Lungs:  Clear throughout to auscultation.   Heart:  Regular rate and rhythm; no murmurs, clicks, rubs,  or gallops. Abdomen:  Soft, nontender and nondistended. Normal bowel sounds.   Neuro/Psych:  Normal mood and affect. A and O x 3   Audrena Talaga E. Candis Schatz, MD [facility]

## 2021-03-13 NOTE — Op Note (Signed)
[facility] Patient Name: Lindsey Bowen Procedure Date: 03/13/2021 4:07 PM MRN: [id number] Endoscopist: Lorin Picket E. Tomasa Rand , MD Age: 50 Referring MD:  Date of Birth: 11/22/1970 Gender: Female Account #: [id number] Procedure:                Colonoscopy Indications:              Screening for colorectal malignant neoplasm, This                            is the patient's first colonoscopy Medicines:                Monitored Anesthesia Care Procedure:                Pre-Anesthesia Assessment:                           - Prior to the procedure, a History and Physical                            was performed, and patient medications and                            allergies were reviewed. The patient's tolerance of                            previous anesthesia was also reviewed. The risks                            and benefits of the procedure and the sedation                            options and risks were discussed with the patient.                            All questions were answered, and informed consent                            was obtained. Prior Anticoagulants: The patient has                            taken no previous anticoagulant or antiplatelet                            agents. ASA Grade Assessment: III - A patient with                            severe systemic disease. After reviewing the risks                            and benefits, the patient was deemed in                            satisfactory condition to undergo the procedure.  After obtaining informed consent, the colonoscope                            was passed under direct vision. Throughout the                            procedure, the patient's blood pressure, pulse, and                            oxygen saturations were monitored continuously. The                            Olympus CF-HQ190L ([id number]) Colonoscope was                            introduced through  the anus and advanced to the the                            terminal ileum, with identification of the                            appendiceal orifice and IC valve. The colonoscopy                            was performed without difficulty. The patient                            tolerated the procedure well. The quality of the                            bowel preparation was adequate. The terminal ileum,                            ileocecal valve, appendiceal orifice, and rectum                            were photographed. Scope In: 4:30:33 PM Scope Out: 4:41:53 PM Scope Withdrawal Time: 0 hours 7 minutes 53 seconds  Total Procedure Duration: 0 hours 11 minutes 20 seconds  Findings:                 The perianal and digital rectal examinations were                            normal. Pertinent negatives include normal                            sphincter tone and no palpable rectal lesions.                           The colon (entire examined portion) appeared normal.                           The terminal ileum appeared normal.  The retroflexed view of the distal rectum and anal                            verge was normal and showed no anal or rectal                            abnormalities. Complications:            No immediate complications. Estimated Blood Loss:     Estimated blood loss: none. Impression:               - The entire examined colon is normal.                           - The examined portion of the ileum was normal.                           - The distal rectum and anal verge are normal on                            retroflexion view.                           - No specimens collected. Recommendation:           - Patient has a contact number available for                            emergencies. The signs and symptoms of potential                            delayed complications were discussed with the                            patient. Return  to normal activities tomorrow.                            Written discharge instructions were provided to the                            patient.                           - Resume previous diet.                           - Continue present medications.                           - Repeat colonoscopy in 10 years for screening                            purposes. Yuli Lanigan E. Tomasa Rand, MD 03/13/2021 4:49:04 PM This report has been signed electronically.

## 2021-03-13 NOTE — Op Note (Signed)
[facility] Patient Name: Lindsey Bowen Procedure Date: 03/13/2021 4:14 PM MRN: [id number] Endoscopist: Lorin Picket E. Tomasa Rand , MD Age: 50 Referring MD:  Date of Birth: 1971-05-01 Gender: Female Account #: [id number] Procedure:                Upper GI endoscopy Indications:              Generalized abdominal pain, Abdominal bloating Medicines:                Monitored Anesthesia Care Procedure:                Pre-Anesthesia Assessment:                           - Prior to the procedure, a History and Physical                            was performed, and patient medications and                            allergies were reviewed. The patient's tolerance of                            previous anesthesia was also reviewed. The risks                            and benefits of the procedure and the sedation                            options and risks were discussed with the patient.                            All questions were answered, and informed consent                            was obtained. Prior Anticoagulants: The patient has                            taken no previous anticoagulant or antiplatelet                            agents. ASA Grade Assessment: III - A patient with                            severe systemic disease. After reviewing the risks                            and benefits, the patient was deemed in                            satisfactory condition to undergo the procedure.                           After obtaining informed consent, the endoscope was  passed under direct vision. Throughout the                            procedure, the patient's blood pressure, pulse, and                            oxygen saturations were monitored continuously. The                            GIF [id number] #[id number] was introduced through the                            mouth, and advanced to the third part of duodenum.                             The upper GI endoscopy was accomplished without                            difficulty. The patient tolerated the procedure                            well. Scope In: Scope Out: Findings:                 The examined portions of the nasopharynx,                            oropharynx and larynx were normal.                           The gastroesophageal flap valve was visualized                            endoscopically and classified as Hill Grade III                            (minimal fold, loose to endoscope, hiatal hernia                            likely).                           The examined esophagus was normal.                           The entire examined stomach was normal. Biopsies                            were taken with a cold forceps for Helicobacter                            pylori testing. Estimated blood loss was minimal.                           The examined duodenum was normal. Complications:  No immediate complications. Estimated Blood Loss:     Estimated blood loss was minimal. Impression:               - The examined portions of the nasopharynx,                            oropharynx and larynx were normal.                           - Gastroesophageal flap valve classified as Hill                            Grade III (minimal fold, loose to endoscope, hiatal                            hernia likely).                           - Normal esophagus.                           - Normal stomach. Biopsied.                           - Normal examined duodenum. Recommendation:           - Patient has a contact number available for                            emergencies. The signs and symptoms of potential                            delayed complications were discussed with the                            patient. Return to normal activities tomorrow.                            Written discharge instructions were provided to the                             patient.                           - Resume previous diet.                           - Continue present medications.                           - Await pathology results.                           - Follow up as needed with Dr. Tomasa Rand in GI                            Clinic for further management of GI symptoms. Lindsey Bowen E. Tomasa Rand,  MD 03/13/2021 4:46:35 PM This report has been signed electronically.

## 2021-03-13 NOTE — Progress Notes (Signed)
Called to room to assist during endoscopic procedure.  Patient ID and intended procedure confirmed with present staff. Received instructions for my participation in the procedure from the performing physician.  

## 2021-03-17 ENCOUNTER — Telehealth: Payer: Self-pay | Admitting: *Deleted

## 2021-03-17 DIAGNOSIS — E559 Vitamin D deficiency, unspecified: Secondary | ICD-10-CM | POA: Diagnosis not present

## 2021-03-17 DIAGNOSIS — E785 Hyperlipidemia, unspecified: Secondary | ICD-10-CM | POA: Diagnosis not present

## 2021-03-17 DIAGNOSIS — Z794 Long term (current) use of insulin: Secondary | ICD-10-CM | POA: Diagnosis not present

## 2021-03-17 DIAGNOSIS — I10 Essential (primary) hypertension: Secondary | ICD-10-CM | POA: Diagnosis not present

## 2021-03-17 DIAGNOSIS — E039 Hypothyroidism, unspecified: Secondary | ICD-10-CM | POA: Diagnosis not present

## 2021-03-17 DIAGNOSIS — Z79899 Other long term (current) drug therapy: Secondary | ICD-10-CM | POA: Diagnosis not present

## 2021-03-17 DIAGNOSIS — E611 Iron deficiency: Secondary | ICD-10-CM | POA: Diagnosis not present

## 2021-03-17 DIAGNOSIS — E1169 Type 2 diabetes mellitus with other specified complication: Secondary | ICD-10-CM | POA: Diagnosis not present

## 2021-03-17 DIAGNOSIS — E1129 Type 2 diabetes mellitus with other diabetic kidney complication: Secondary | ICD-10-CM | POA: Diagnosis not present

## 2021-03-17 DIAGNOSIS — Z01818 Encounter for other preprocedural examination: Secondary | ICD-10-CM | POA: Diagnosis not present

## 2021-03-17 DIAGNOSIS — Z136 Encounter for screening for cardiovascular disorders: Secondary | ICD-10-CM | POA: Diagnosis not present

## 2021-03-17 DIAGNOSIS — Z6841 Body Mass Index (BMI) 40.0 and over, adult: Secondary | ICD-10-CM | POA: Diagnosis not present

## 2021-03-17 NOTE — Telephone Encounter (Signed)
  Follow up Call-  Call back number 03/13/2021  Post procedure Call Back phone  # [phone]  Permission to leave phone message No  Some recent data might be hidden     Patient questions:  Do you have a fever, pain , or abdominal swelling? No. Pain Score  0 *  Have you tolerated food without any problems? Yes.    Have you been able to return to your normal activities? Yes.    Do you have any questions about your discharge instructions: Diet   No. Medications  No. Follow up visit  No.  Do you have questions or concerns about your Care? No.  Actions: * If pain score is 4 or above: No action needed, pain <4.  Have you developed a fever since your procedure? no  2.   Have you had an respiratory symptoms (SOB or cough) since your procedure? no  3.   Have you tested positive for COVID 19 since your procedure no  4.   Have you had any family members/close contacts diagnosed with the COVID 19 since your procedure?  no   If yes to any of these questions please route to Laverna Peace, RN and Karlton Lemon, RN

## 2021-03-24 ENCOUNTER — Encounter: Payer: Self-pay | Admitting: Gastroenterology

## 2021-04-15 DIAGNOSIS — E1129 Type 2 diabetes mellitus with other diabetic kidney complication: Secondary | ICD-10-CM | POA: Diagnosis not present

## 2021-04-15 DIAGNOSIS — E1169 Type 2 diabetes mellitus with other specified complication: Secondary | ICD-10-CM | POA: Diagnosis not present

## 2021-04-15 DIAGNOSIS — E785 Hyperlipidemia, unspecified: Secondary | ICD-10-CM | POA: Diagnosis not present

## 2021-04-15 DIAGNOSIS — E039 Hypothyroidism, unspecified: Secondary | ICD-10-CM | POA: Diagnosis not present

## 2021-04-15 DIAGNOSIS — R809 Proteinuria, unspecified: Secondary | ICD-10-CM | POA: Diagnosis not present

## 2021-04-15 DIAGNOSIS — I152 Hypertension secondary to endocrine disorders: Secondary | ICD-10-CM | POA: Diagnosis not present

## 2021-04-15 DIAGNOSIS — E1159 Type 2 diabetes mellitus with other circulatory complications: Secondary | ICD-10-CM | POA: Diagnosis not present

## 2021-04-15 DIAGNOSIS — Z794 Long term (current) use of insulin: Secondary | ICD-10-CM | POA: Diagnosis not present

## 2021-04-16 DIAGNOSIS — R809 Proteinuria, unspecified: Secondary | ICD-10-CM | POA: Insufficient documentation

## 2021-04-17 ENCOUNTER — Ambulatory Visit: Payer: Medicaid Other | Admitting: Gastroenterology

## 2021-04-17 ENCOUNTER — Ambulatory Visit: Payer: Medicaid Other | Attending: Nurse Practitioner | Admitting: Nurse Practitioner

## 2021-04-17 ENCOUNTER — Other Ambulatory Visit (HOSPITAL_COMMUNITY)
Admission: RE | Admit: 2021-04-17 | Discharge: 2021-04-17 | Disposition: A | Discharge status: Home / Self Care | Payer: Medicaid Other | Source: Ambulatory Visit | Attending: Nurse Practitioner | Admitting: Nurse Practitioner

## 2021-04-17 ENCOUNTER — Other Ambulatory Visit: Payer: Self-pay

## 2021-04-17 ENCOUNTER — Encounter: Payer: Self-pay | Admitting: Nurse Practitioner

## 2021-04-17 VITALS — BP 144/85 | HR 79 | Ht 64.0 in | Wt 245.4 lb

## 2021-04-17 DIAGNOSIS — Z8249 Family history of ischemic heart disease and other diseases of the circulatory system: Secondary | ICD-10-CM | POA: Diagnosis not present

## 2021-04-17 DIAGNOSIS — Z79890 Hormone replacement therapy (postmenopausal): Secondary | ICD-10-CM | POA: Insufficient documentation

## 2021-04-17 DIAGNOSIS — E039 Hypothyroidism, unspecified: Secondary | ICD-10-CM | POA: Diagnosis not present

## 2021-04-17 DIAGNOSIS — N760 Acute vaginitis: Secondary | ICD-10-CM | POA: Insufficient documentation

## 2021-04-17 DIAGNOSIS — Z6841 Body Mass Index (BMI) 40.0 and over, adult: Secondary | ICD-10-CM | POA: Insufficient documentation

## 2021-04-17 DIAGNOSIS — E1165 Type 2 diabetes mellitus with hyperglycemia: Secondary | ICD-10-CM | POA: Insufficient documentation

## 2021-04-17 DIAGNOSIS — G4733 Obstructive sleep apnea (adult) (pediatric): Secondary | ICD-10-CM | POA: Insufficient documentation

## 2021-04-17 DIAGNOSIS — Z79899 Other long term (current) drug therapy: Secondary | ICD-10-CM | POA: Diagnosis not present

## 2021-04-17 DIAGNOSIS — E669 Obesity, unspecified: Secondary | ICD-10-CM | POA: Diagnosis not present

## 2021-04-17 DIAGNOSIS — Z114 Encounter for screening for human immunodeficiency virus [HIV]: Secondary | ICD-10-CM | POA: Insufficient documentation

## 2021-04-17 DIAGNOSIS — Z8673 Personal history of transient ischemic attack (TIA), and cerebral infarction without residual deficits: Secondary | ICD-10-CM | POA: Diagnosis not present

## 2021-04-17 DIAGNOSIS — Z79891 Long term (current) use of opiate analgesic: Secondary | ICD-10-CM | POA: Diagnosis not present

## 2021-04-17 DIAGNOSIS — J45909 Unspecified asthma, uncomplicated: Secondary | ICD-10-CM | POA: Diagnosis not present

## 2021-04-17 DIAGNOSIS — Z794 Long term (current) use of insulin: Secondary | ICD-10-CM | POA: Diagnosis not present

## 2021-04-17 DIAGNOSIS — I10 Essential (primary) hypertension: Secondary | ICD-10-CM | POA: Diagnosis not present

## 2021-04-17 MED ORDER — FLUCONAZOLE 150 MG PO TABS: 150.0000 mg | ORAL_TABLET | Freq: Once | ORAL | 0 refills | Status: AC

## 2021-04-17 MED ORDER — BLOOD PRESSURE MONITOR DEVI: 0 refills | Status: DC

## 2021-04-17 NOTE — Progress Notes (Signed)
Assessment & Plan:  Lindsey Bowen was seen today for hypertension.  Diagnoses and all orders for this visit:  Primary hypertension -     Blood Pressure Monitor DEVI; Please provide patient with insurance approved blood pressure monitor ICD I 10.0 Continue all antihypertensives as prescribed.  Remember to bring in your blood pressure log with you for your follow up appointment.  DASH/Mediterranean Diets are healthier choices for HTN.    Acute vaginitis -     fluconazole (DIFLUCAN) 150 MG tablet; Take 1 tablet (150 mg total) by mouth once for 1 dose. -     Cervicovaginal ancillary only  Encounter for screening for HIV -     HIV antibody (with reflex)  OSA (obstructive sleep apnea) -     PSG Sleep Study; Future   Patient has been counseled on age-appropriate routine health concerns for screening and prevention. These are reviewed and up-to-date. Referrals have been placed accordingly. Immunizations are up-to-date or declined.    Subjective:   Chief Complaint  Patient presents with   Hypertension    Lindsey Bowen 50 y.o. female presents to office today for hypertension. She has a history of HTN, DM, Asthma, OSA and thyroid disorder  OSA She was diagnosed with sleep apnea a few years ago. Never received a CPAP. She has Obesity. Current symptoms of daytime fatigue, morning fatigue, and hypertension. Patient generally gets 4 or 5 hours of sleep per night, and states they generally have nightime awakenings. Snoring of moderate severity is present. Apneic episodes are present. Nasal obstruction is not present.  Patient has not had tonsillectomy.    DM 2 She is currently being followed by Endocrinology and the Bariatric center for weight management, Thyroid disorder and DM with [facility]. Diabetes not well controlled.  Lab Results  Component Value Date   HGBA1C 8.2 03-17-2021    HTN Blood pressure is well  controlled. Currently taking lisinopril 5 mg daily but not HCTZ 25 mg as  prescribed. LDL not at goal with crestor 20 mg daily. BP device was ordered for her today. I have also instructed her to resume her HCTZ . She was only taking it as needed for BLE swelling and not every day as instructed.  BP Readings from Last 3 Encounters:  04/17/21 (!) 144/85  03/13/21 109/79  01/29/21 120/74    Lab Results  Component Value Date   LDLCALC 102 (H) 10/20/2020     Vaginitis Endorses vaginal itching and irritation over the past several days.    Review of Systems  Constitutional:  Negative for fever, malaise/fatigue and weight loss.  HENT: Negative.  Negative for nosebleeds.   Eyes: Negative.  Negative for blurred vision, double vision and photophobia.  Respiratory: Negative.  Negative for cough and shortness of breath.   Cardiovascular: Negative.  Negative for chest pain, palpitations and leg swelling.  Gastrointestinal: Negative.  Negative for heartburn, nausea and vomiting.  Genitourinary:        SEE HPI  Musculoskeletal: Negative.  Negative for myalgias.  Neurological: Negative.  Negative for dizziness, focal weakness, seizures and headaches.  Psychiatric/Behavioral: Negative.  Negative for suicidal ideas.    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    Past Surgical History:  Procedure Laterality Date   CHOLECYSTECTOMY     DILATION AND CURETTAGE OF UTERUS     HERNIA REPAIR      Family History  Problem Relation Age of Onset   Diabetes Mother    Hypertension Mother    Addison's  disease Mother    Asthma Mother    Colon polyps Mother    Irritable bowel syndrome Mother    Glaucoma Father    Mental retardation Sister    Glaucoma Sister    Heart attack Maternal Aunt    Heart attack Maternal Uncle    Diabetes Paternal Uncle    Heart attack Maternal Grandmother    Stroke Maternal Grandmother    Ulcerative colitis Maternal Grandfather    Diabetes Maternal Grandfather    Stomach cancer Maternal Grandfather    Diabetes Paternal Grandmother    Irritable bowel syndrome Daughter    Diabetes Son    Colon cancer Neg Hx    Esophageal cancer Neg Hx    Rectal cancer Neg Hx     Social History Reviewed with no changes to be made today.   Outpatient Medications Prior to Visit  Medication Sig Dispense Refill   ACCU-CHEK GUIDE test strip USE TO CHECK BLOOD SUGAR 4 TIME(S) DAILY.     acetaminophen (TYLENOL) 650 MG CR tablet Take 650 mg by mouth every 8 (eight) hours as needed for pain.     albuterol (VENTOLIN HFA) 108 (90 Base) MCG/ACT inhaler Inhale 1-2 puffs into the lungs every 6 (six) hours as needed for wheezing or shortness of breath. 18 g 1   BD PEN NEEDLE NANO 2ND GEN 32G X 4 MM MISC ONE EACH BY OTHER ROUTE DAILY.     Blood Glucose Monitoring Suppl (ACCU-CHEK GUIDE ME) w/Device KIT USE AS DIRECTED TO MONITOR BLOOD SUGAR.     cetirizine (ZYRTEC) 10 MG tablet TAKE 1 TABLET BY MOUTH EVERY DAY 90 tablet 0   Continuous Blood Gluc Sensor (DEXCOM G6 SENSOR) MISC      Continuous Blood Gluc Transmit (DEXCOM G6 TRANSMITTER) MISC      cyclobenzaprine (FLEXERIL) 10 MG tablet TAKE 1 TABLET BY MOUTH EVERYDAY AT BEDTIME 30 tablet 0   EPINEPHrine 0.3 mg/0.3 mL IJ SOAJ injection Inject 0.3 mg into the muscle as needed for anaphylaxis. 1 each 1   fluticasone (FLONASE) 50 MCG/ACT nasal spray Place 2 sprays into both nostrils daily. 16 g 2   hydrochlorothiazide (HYDRODIURIL) 50 MG tablet Take 50 mg by mouth daily.     insulin regular human CONCENTRATED (HUMULIN R U-500 KWIKPEN) 500  UNIT/ML kwikpen      levocetirizine (XYZAL) 5 MG tablet Take 5 mg by mouth every evening.     levothyroxine (SYNTHROID, LEVOTHROID) 175 MCG tablet Take 1 tablet (175 mcg total) by mouth daily before breakfast. 30 tablet 0   lisinopril (PRINIVIL,ZESTRIL) 5 MG tablet Take 5 mg by mouth daily.     Multiple Vitamin (MULTIVITAMIN) tablet Take 1 tablet by mouth daily.     naproxen sodium (ALEVE) 220 MG tablet Take 220 mg by mouth daily as needed (headache).     rosuvastatin (CRESTOR) 20 MG tablet Take 20 mg by mouth at bedtime.  No facility-administered medications prior to visit.    Allergies  Allergen Reactions   Dust Mite Extract Hives and Itching   Shellfish Allergy Anaphylaxis       Objective:    BP (!) 144/85   Pulse 79   Ht 5' 4"  (1.626 m)   Wt 245 lb 6 oz (111.3 kg)   SpO2 98%   BMI 42.12 kg/m  Wt Readings from Last 3 Encounters:  04/17/21 245 lb 6 oz (111.3 kg)  03/13/21 243 lb (110.2 kg)  01/29/21 243 lb 4 oz (110.3 kg)    Physical Exam Vitals and nursing note reviewed.  Constitutional:      Appearance: She is well-developed.  HENT:     Head: Normocephalic and atraumatic.  Cardiovascular:     Rate and Rhythm: Normal rate and regular rhythm.     Heart sounds: Normal heart sounds. No murmur heard.   No friction rub. No gallop.  Pulmonary:     Effort: Pulmonary effort is normal. No tachypnea or respiratory distress.     Breath sounds: Normal breath sounds. No decreased breath sounds, wheezing, rhonchi or rales.  Chest:     Chest wall: No tenderness.  Abdominal:     General: Bowel sounds are normal.     Palpations: Abdomen is soft.  Musculoskeletal:        General: Normal range of motion.     Cervical back: Normal range of motion.  Skin:    General: Skin is warm and dry.  Neurological:     Mental Status: She is alert and oriented to person, place, and time.     Coordination: Coordination normal.  Psychiatric:        Behavior: Behavior normal. Behavior is  cooperative.        Thought Content: Thought content normal.        Judgment: Judgment normal.         Patient has been counseled extensively about nutrition and exercise as well as the importance of adherence with medications and regular follow-up. The patient was given clear instructions to go to ER or return to medical center if symptoms don't improve, worsen or new problems develop. The patient verbalized understanding.   Follow-up: Return in about 3 months (around 07/18/2021).   Gildardo Pounds, FNP-BC [facility] and [place], [place]   04/17/2021, 2:20 PM

## 2021-04-18 LAB — HIV ANTIBODY (ROUTINE TESTING W REFLEX): HIV Screen 4th Generation wRfx: NONREACTIVE

## 2021-04-19 ENCOUNTER — Other Ambulatory Visit: Payer: Self-pay | Admitting: Nurse Practitioner

## 2021-04-19 DIAGNOSIS — J302 Other seasonal allergic rhinitis: Secondary | ICD-10-CM

## 2021-04-20 ENCOUNTER — Other Ambulatory Visit: Payer: Self-pay | Admitting: Nurse Practitioner

## 2021-04-20 LAB — CERVICOVAGINAL ANCILLARY ONLY
Bacterial Vaginitis (gardnerella): POSITIVE — AB
Candida Glabrata: NEGATIVE
Candida Vaginitis: POSITIVE — AB
Chlamydia: NEGATIVE
Comment: NEGATIVE
Comment: NEGATIVE
Comment: NEGATIVE
Comment: NEGATIVE
Comment: NEGATIVE
Comment: NORMAL
Neisseria Gonorrhea: NEGATIVE
Trichomonas: NEGATIVE

## 2021-04-20 MED ORDER — FLUCONAZOLE 150 MG PO TABS: 150.0000 mg | ORAL_TABLET | Freq: Once | ORAL | 0 refills | Status: AC

## 2021-04-20 MED ORDER — METRONIDAZOLE 500 MG PO TABS: 500.0000 mg | ORAL_TABLET | Freq: Two times a day (BID) | ORAL | 0 refills | Status: AC

## 2021-04-20 NOTE — Telephone Encounter (Signed)
Requested medication (s) are due for refill today: see request  Requested medication (s) are on the active medication list: yes, albuterol ventolin , not proair HFA  Last refill:  10/20/20   #18g 1 refill  Future visit scheduled: yes in 3 months  Notes to clinic:  Alternative Requested:NEED A NEW RX FOR NAME BRAND VENTOLIN. PROAIR IS COMPLETELY OFF THE MARKET     Requested Prescriptions  Pending Prescriptions Disp Refills   PROAIR HFA 108 (90 Base) MCG/ACT inhaler [Pharmacy Med Name: PROAIR HFA 90 MCG INHALER] 8.5 each 1    Sig: INHALE 1-2 PUFFS BY MOUTH EVERY 6 HOURS AS NEEDED FOR WHEEZE OR SHORTNESS OF BREATH     Pulmonology:  Beta Agonists Failed - 04/19/2021  4:16 PM      Failed - One inhaler should last at least one month. If the patient is requesting refills earlier, contact the patient to check for uncontrolled symptoms.      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   [facility] [place], Shea Stakes, NP   1 year ago Dyspnea on exertion   [facility] [place], Shea Stakes, NP   1 year ago Chronic bilateral low back pain with left-sided sciatica   [facility] Claiborne Rigg, NP   1 year ago Encounter for Papanicolaou smear for cervical cancer screening   [facility] Claiborne Rigg, NP       Future Appointments             In 3 months Claiborne Rigg, NP [facility] [facility]

## 2021-04-24 ENCOUNTER — Ambulatory Visit: Payer: Medicaid Other | Admitting: Nurse Practitioner

## 2021-04-24 DIAGNOSIS — M519 Unspecified thoracic, thoracolumbar and lumbosacral intervertebral disc disorder: Secondary | ICD-10-CM | POA: Diagnosis not present

## 2021-04-24 DIAGNOSIS — M545 Low back pain, unspecified: Secondary | ICD-10-CM | POA: Diagnosis not present

## 2021-04-29 DIAGNOSIS — M25562 Pain in left knee: Secondary | ICD-10-CM | POA: Diagnosis not present

## 2021-04-29 DIAGNOSIS — E109 Type 1 diabetes mellitus without complications: Secondary | ICD-10-CM | POA: Diagnosis not present

## 2021-04-29 DIAGNOSIS — M5136 Other intervertebral disc degeneration, lumbar region: Secondary | ICD-10-CM | POA: Diagnosis not present

## 2021-04-29 DIAGNOSIS — M545 Low back pain, unspecified: Secondary | ICD-10-CM | POA: Diagnosis not present

## 2021-05-04 DIAGNOSIS — I10 Essential (primary) hypertension: Secondary | ICD-10-CM | POA: Diagnosis not present

## 2021-05-18 DIAGNOSIS — Z713 Dietary counseling and surveillance: Secondary | ICD-10-CM | POA: Diagnosis not present

## 2021-06-08 ENCOUNTER — Ambulatory Visit: Payer: Self-pay | Admitting: *Deleted

## 2021-06-08 NOTE — Telephone Encounter (Signed)
Summary: Dizziness since Tuesday.   Pt stated she started to feel unwell since Tuesday 06/02/21 of last week. She stated she had a fever,cough,weakness,ripple sound when laying down and feeling dizzy.   Please advice.     Attempted to call patient- left message to call office to discuss symptoms.

## 2021-06-08 NOTE — Telephone Encounter (Signed)
Pt called back in but either hung up or the line got disconnected before agent got her transferred to me.   Pt told agent she did not answer the call a few minutes ago when a nurse tried to return the call because it came up "spam" on her caller ID.    I left a voicemail for her to call back to [facility] and left the number.

## 2021-06-24 DIAGNOSIS — R5383 Other fatigue: Secondary | ICD-10-CM | POA: Diagnosis not present

## 2021-06-24 DIAGNOSIS — U071 COVID-19: Secondary | ICD-10-CM | POA: Diagnosis not present

## 2021-07-20 ENCOUNTER — Ambulatory Visit: Payer: Medicaid Other | Attending: Nurse Practitioner | Admitting: Nurse Practitioner

## 2021-07-21 DIAGNOSIS — M545 Low back pain, unspecified: Secondary | ICD-10-CM | POA: Diagnosis not present

## 2021-07-21 DIAGNOSIS — M25562 Pain in left knee: Secondary | ICD-10-CM | POA: Diagnosis not present

## 2021-08-13 ENCOUNTER — Ambulatory Visit: Payer: Medicaid Other | Attending: Nurse Practitioner | Admitting: Physician Assistant

## 2021-08-13 ENCOUNTER — Encounter: Payer: Self-pay | Admitting: Physician Assistant

## 2021-08-13 ENCOUNTER — Other Ambulatory Visit: Payer: Self-pay

## 2021-08-13 VITALS — BP 144/87 | HR 87 | Resp 16 | Wt 242.6 lb

## 2021-08-13 DIAGNOSIS — K439 Ventral hernia without obstruction or gangrene: Secondary | ICD-10-CM | POA: Diagnosis not present

## 2021-08-13 DIAGNOSIS — E1149 Type 2 diabetes mellitus with other diabetic neurological complication: Secondary | ICD-10-CM | POA: Diagnosis not present

## 2021-08-13 LAB — GLUCOSE, POCT (MANUAL RESULT ENTRY): POC Glucose: 212 mg/dl — AB (ref 70–99)

## 2021-08-13 LAB — POCT GLYCOSYLATED HEMOGLOBIN (HGB A1C): Hemoglobin A1C: 9.8 % — AB (ref 4.0–5.6)

## 2021-08-13 NOTE — Progress Notes (Signed)
Lindsey Bowen, is a 51 y.o. female  WHQ:[id number]  KZL:[id number]  DOB - 1970/10/23  Chief Complaint  Patient presents with   Hernia       Subjective:   Lindsey Bowen is a 51 y.o. female here today for a hernia on her abdomen.  She had to have abdominal repair after pregnancy in 2011.  She feels that her hernia has returned/enlarging.  No abdominal pain.  Diabetes being followed by Dr Hartford Poli at [facility]  No problems updated.  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) 05/27/2017    MEDICATIONS AT HOME: Prior to Admission medications   Medication Sig Start Date End Date Taking? Authorizing Provider  ACCU-CHEK GUIDE test strip USE TO CHECK BLOOD SUGAR 4 TIME(S) DAILY. 08/02/19  Yes [provider]  acetaminophen (TYLENOL) 650 MG CR tablet Take 650 mg by mouth every 8 (eight) hours as needed for pain.   Yes [provider]  BD PEN NEEDLE NANO 2ND GEN 32G X 4 MM MISC ONE EACH BY OTHER ROUTE DAILY. 05/18/19  Yes [provider]  Blood Glucose Monitoring Suppl (ACCU-CHEK GUIDE ME) w/Device KIT USE AS DIRECTED TO MONITOR BLOOD SUGAR. 03/13/19  Yes [provider]  Blood Pressure Monitor DEVI Please provide patient with insurance approved blood pressure monitor ICD I 10.0 04/17/21  Yes Gildardo Pounds, NP  cetirizine (ZYRTEC) 10 MG tablet TAKE 1 TABLET  BY MOUTH EVERY DAY 02/02/19  Yes Charlott Rakes, MD  Continuous Blood Gluc Sensor (DEXCOM G6 SENSOR) [place]  06/09/20  Yes [provider]  Continuous Blood Gluc Transmit (DEXCOM G6 TRANSMITTER) [place]  02/23/20  Yes [provider]  cyclobenzaprine (FLEXERIL) 10 MG tablet TAKE 1 TABLET BY MOUTH EVERYDAY AT BEDTIME 12/01/19  Yes Gildardo Pounds, NP  EPINEPHrine 0.3 mg/0.3 mL IJ SOAJ injection Inject 0.3 mg into the muscle as needed for anaphylaxis. 10/20/20  Yes Gildardo Pounds, NP  fluticasone (FLONASE) 50 MCG/ACT nasal spray Place 2 sprays into both nostrils daily. 08/11/18  Yes Lamptey, Myrene Galas, MD  hydrochlorothiazide (HYDRODIURIL) 50 MG tablet Take 50 mg by mouth daily. 08/08/20  Yes [provider]  insulin regular human CONCENTRATED (HUMULIN R U-500 KWIKPEN) 500 UNIT/ML kwikpen  11/29/19  Yes [provider]  levocetirizine (XYZAL) 5 MG tablet Take 5 mg by mouth every evening.   Yes [provider]  levothyroxine (SYNTHROID, LEVOTHROID) 175 MCG tablet Take 1 tablet (175 mcg total) by mouth daily before breakfast. 05/28/17  Yes Bonnita Hollow, MD  lisinopril (PRINIVIL,ZESTRIL) 5 MG tablet Take 5 mg by mouth daily.   Yes [provider]  Multiple Vitamin (MULTIVITAMIN) tablet Take 1 tablet by mouth daily.   Yes [provider]  naproxen sodium (ALEVE) 220 MG tablet Take 220 mg  by mouth daily as needed (headache).   Yes [provider]  PROAIR HFA 108 (90 Base) MCG/ACT inhaler INHALE 1-2 PUFFS BY MOUTH EVERY 6 HOURS AS NEEDED FOR WHEEZE OR SHORTNESS OF BREATH 04/20/21  Yes Gildardo Pounds, NP  rosuvastatin (CRESTOR) 20 MG tablet Take 20 mg by mouth at bedtime. 06/27/20  Yes [provider]    ROS: Neg HEENT Neg resp Neg cardiac Neg GI Neg GU Neg MS Neg psych Neg neuro  Objective:   Vitals:   08/13/21 1535  BP: (!) 144/87  Pulse: 87  Resp: 16  SpO2: 95%  Weight: 242 lb 9.6 oz (110 kg)   Exam General  appearance : Awake, alert, not in any distress. Speech Clear. Not toxic looking HEENT: Atraumatic and Normocephalic Neck: Supple, no JVD. No cervical lymphadenopathy.  Chest: Good air entry bilaterally, CTAB.  No rales/rhonchi/wheezing CVS: S1 S2 regular, no murmurs.  Abdomen: Bowel sounds present, Non tender and not distended with no gaurding, rigidity or rebound.  Upper ventral hernia present without tenderness or incarceration.  ~4cm Extremities: B/L Lower Ext shows no edema, both legs are warm to touch Neurology: Awake alert, and oriented X 3, CN II-XII intact, Non focal Skin: No Rash  Data Review Lab Results  Component Value Date   HGBA1C 9.8 (A) 08/13/2021   HGBA1C 9.9 (H) 05/27/2017    Assessment & Plan   1. Diabetes mellitus type 2 with neurological manifestations ([place]) Uncontrolled-sees endocrine in a few weeks.  Acknowledges awful diet and will work on that - Glucose (CBG) - HgB A1c  2. Ventral hernia without obstruction or gangrene - Ambulatory referral to General Surgery    Patient have been counseled extensively about nutrition and exercise. Other issues discussed during this visit include: low cholesterol diet, weight control and daily exercise, foot care, annual eye examinations at Ophthalmology, importance of adherence with medications and regular follow-up. We also discussed long term complications of uncontrolled diabetes and hypertension.   Return in about 3 months (around 11/10/2021) for Lindsey Bowen for chronic conditions.  The patient was given clear instructions to go to ER or return to medical center if symptoms don't improve, worsen or new problems develop. The patient verbalized understanding. The patient was told to call to get lab results if they haven't heard anything in the next week.      Freeman Caldron, PA-C [facility] and [place], [place]   08/13/2021, 4:04 PM Patient ID: Lindsey Bowen, female   DOB:  [birth year], 51 y.o.   MRN: [id number]

## 2021-08-26 ENCOUNTER — Ambulatory Visit: Payer: Medicaid Other | Admitting: Physician Assistant

## 2021-08-31 ENCOUNTER — Other Ambulatory Visit: Payer: Self-pay

## 2021-08-31 ENCOUNTER — Ambulatory Visit (INDEPENDENT_AMBULATORY_CARE_PROVIDER_SITE_OTHER): Payer: Medicaid Other | Admitting: Podiatry

## 2021-08-31 DIAGNOSIS — L600 Ingrowing nail: Secondary | ICD-10-CM

## 2021-08-31 MED ORDER — CEPHALEXIN 500 MG PO CAPS: 500.0000 mg | ORAL_CAPSULE | Freq: Three times a day (TID) | ORAL | 0 refills | Status: DC

## 2021-09-07 DIAGNOSIS — L600 Ingrowing nail: Secondary | ICD-10-CM | POA: Insufficient documentation

## 2021-09-07 NOTE — Progress Notes (Signed)
Subjective: ?51 year old female presents the office today for concerns of some dried blood, bleeding noted to the left hallux toenail.  This started a week ago.  She states the nails become thickened.  She denies any purulence.  No swelling or redness.  Her last A1c was 9.8 and last glucose was 150. ? ?Objective: ?AAO x3, NAD ?DP/PT pulses palpable bilaterally, CRT less than 3 seconds ?Nails hypertrophic, dystrophic with brown discoloration.  Mild incurvation of the nail.  There is some dried blood present on the left hallux nail border.  There is no significant edema, erythema and there is no purulence noted.  No pain with calf compression, swelling, warmth, erythema ? ?Assessment: ?Ingrown toenail ? ?Plan: ?-All treatment options discussed with the patient including all alternatives, risks, complications.  ?-Sharply debrided the ingrown toenail with any complications or bleeding.  Recommend Epsom salt soaks dry thoroughly apply a small amount of antibiotic ointment and a bandage.  Monitor for any signs or symptoms of infection.  If needed we will proceed with partial nail avulsion ?-Patient encouraged to call the office with any questions, concerns, change in symptoms.  ? ?Vivi Barrack DPM ? ?

## 2021-09-08 DIAGNOSIS — K439 Ventral hernia without obstruction or gangrene: Secondary | ICD-10-CM | POA: Diagnosis not present

## 2021-09-10 ENCOUNTER — Other Ambulatory Visit: Payer: Self-pay | Admitting: Surgery

## 2021-09-14 ENCOUNTER — Other Ambulatory Visit: Payer: Self-pay

## 2021-09-14 ENCOUNTER — Ambulatory Visit: Payer: Medicaid Other | Admitting: Podiatry

## 2021-09-14 DIAGNOSIS — L600 Ingrowing nail: Secondary | ICD-10-CM

## 2021-09-14 MED ORDER — MUPIROCIN 2 % EX OINT: 1.0000 "application " | TOPICAL_OINTMENT | Freq: Two times a day (BID) | CUTANEOUS | 2 refills | Status: DC

## 2021-09-21 NOTE — Progress Notes (Signed)
Subjective: ?51 year old female presents the office today for evaluation of ingrown toenail.  She said the area is still bleeding in the nail is loose at the tip of the nail.  Denies any purulence.  No present swelling or redness or any red streaks. ? ?Last A1c was 9.8 on August 13, 2021 ? ?Objective: ?AAO x3, NAD ?DP/PT pulses palpable bilaterally, CRT less than 3 seconds ?Left hallux toenail is loose with underlying nailbed distally unable to read.  Can see granular wound underneath the nail.  There is blood along the medial nail border which is fresh.  There is no purulence.  Minimal edema on the nail.  No ascending cellulitis.  No fluctuance or crepitation. ?No pain with calf compression, swelling, warmth, erythema ? ?Assessment: ?Left hallux toenail infection, ulcer ? ?Plan: ?-All treatment options discussed with the patient including all alternatives, risks, complications.  ?-Given ongoing bleeding as well as now not lifting up and causing the wound underneath the nailbed I do recommend nail removal.  Discussed that her elevated A1c does put her at increased risk of complications. ?-At this time, recommended total nail removal without chemical matricectomy to the left hallux.  Risks and complications were discussed with the patient for which they understand and  verbally consent to the procedure. Under sterile conditions a total of 3 mL of a mixture of 2% lidocaine plain and 0.5% Marcaine plain was infiltrated in a hallux block fashion. Once anesthetized, the skin was prepped in sterile fashion. A tourniquet was then applied. Next the left hallux toenail was removed in total making sure to remove all offending nail borders. Once the nail was removed, the area was debrided and the underlying skin was intact. The area was irrigated and hemostasis was obtained.  A dry sterile dressing was applied. After application of the dressing the tourniquet was removed and there is found to be an immediate capillary refill  time to the digit. The patient tolerated the procedure well any complications. Post procedure instructions were discussed the patient for which he verbally understood. Follow-up in one week for nail check or sooner if any problems are to arise. Discussed signs/symptoms of worsening infection and directed to call the office immediately should any occur or go directly to the emergency room. In the meantime, encouraged to call the office with any questions, concerns, changes symptoms. ?-Patient encouraged to call the office with any questions, concerns, change in symptoms.  ? ?

## 2021-09-23 ENCOUNTER — Other Ambulatory Visit: Payer: Self-pay

## 2021-09-23 ENCOUNTER — Ambulatory Visit (INDEPENDENT_AMBULATORY_CARE_PROVIDER_SITE_OTHER): Payer: Medicaid Other | Admitting: Family Medicine

## 2021-09-23 ENCOUNTER — Encounter (INDEPENDENT_AMBULATORY_CARE_PROVIDER_SITE_OTHER): Payer: Self-pay | Admitting: Family Medicine

## 2021-09-23 VITALS — BP 137/84 | HR 71 | Temp 98.1°F | Ht 64.0 in | Wt 247.0 lb

## 2021-09-23 DIAGNOSIS — R5383 Other fatigue: Secondary | ICD-10-CM

## 2021-09-23 DIAGNOSIS — Z1331 Encounter for screening for depression: Secondary | ICD-10-CM

## 2021-09-23 DIAGNOSIS — E1159 Type 2 diabetes mellitus with other circulatory complications: Secondary | ICD-10-CM | POA: Diagnosis not present

## 2021-09-23 DIAGNOSIS — R0602 Shortness of breath: Secondary | ICD-10-CM | POA: Diagnosis not present

## 2021-09-23 DIAGNOSIS — G4733 Obstructive sleep apnea (adult) (pediatric): Secondary | ICD-10-CM | POA: Diagnosis not present

## 2021-09-23 DIAGNOSIS — E038 Other specified hypothyroidism: Secondary | ICD-10-CM | POA: Diagnosis not present

## 2021-09-23 DIAGNOSIS — I152 Hypertension secondary to endocrine disorders: Secondary | ICD-10-CM | POA: Diagnosis not present

## 2021-09-23 DIAGNOSIS — Z6841 Body Mass Index (BMI) 40.0 and over, adult: Secondary | ICD-10-CM

## 2021-09-23 DIAGNOSIS — E669 Obesity, unspecified: Secondary | ICD-10-CM

## 2021-09-23 DIAGNOSIS — E1169 Type 2 diabetes mellitus with other specified complication: Secondary | ICD-10-CM

## 2021-09-23 DIAGNOSIS — Z794 Long term (current) use of insulin: Secondary | ICD-10-CM | POA: Diagnosis not present

## 2021-09-23 DIAGNOSIS — E782 Mixed hyperlipidemia: Secondary | ICD-10-CM | POA: Diagnosis not present

## 2021-09-24 LAB — CBC WITH DIFFERENTIAL/PLATELET
Basophils Absolute: 0 10*3/uL (ref 0.0–0.2)
Basos: 0 %
EOS (ABSOLUTE): 0.1 10*3/uL (ref 0.0–0.4)
Eos: 2 %
Hematocrit: 39.1 % (ref 34.0–46.6)
Hemoglobin: 12.3 g/dL (ref 11.1–15.9)
Immature Grans (Abs): 0 10*3/uL (ref 0.0–0.1)
Immature Granulocytes: 0 %
Lymphocytes Absolute: 3 10*3/uL (ref 0.7–3.1)
Lymphs: 43 %
MCH: 22.8 pg — ABNORMAL LOW (ref 26.6–33.0)
MCHC: 31.5 g/dL (ref 31.5–35.7)
MCV: 72 fL — ABNORMAL LOW (ref 79–97)
Monocytes Absolute: 0.5 10*3/uL (ref 0.1–0.9)
Monocytes: 7 %
Neutrophils Absolute: 3.2 10*3/uL (ref 1.4–7.0)
Neutrophils: 48 %
Platelets: 305 10*3/uL (ref 150–450)
RBC: 5.4 x10E6/uL — ABNORMAL HIGH (ref 3.77–5.28)
RDW: 14.5 % (ref 11.7–15.4)
WBC: 6.8 10*3/uL (ref 3.4–10.8)

## 2021-09-24 LAB — COMPREHENSIVE METABOLIC PANEL
ALT: 21 IU/L (ref 0–32)
AST: 19 IU/L (ref 0–40)
Albumin/Globulin Ratio: 1.9 (ref 1.2–2.2)
Albumin: 4.6 g/dL (ref 3.8–4.8)
Alkaline Phosphatase: 70 IU/L (ref 44–121)
BUN/Creatinine Ratio: 11 (ref 9–23)
BUN: 10 mg/dL (ref 6–24)
Bilirubin Total: 0.4 mg/dL (ref 0.0–1.2)
CO2: 30 mmol/L — ABNORMAL HIGH (ref 20–29)
Calcium: 9.7 mg/dL (ref 8.7–10.2)
Chloride: 101 mmol/L (ref 96–106)
Creatinine, Ser: 0.89 mg/dL (ref 0.57–1.00)
Globulin, Total: 2.4 g/dL (ref 1.5–4.5)
Glucose: 43 mg/dL — ABNORMAL LOW (ref 70–99)
Potassium: 3.1 mmol/L — ABNORMAL LOW (ref 3.5–5.2)
Sodium: 145 mmol/L — ABNORMAL HIGH (ref 134–144)
Total Protein: 7 g/dL (ref 6.0–8.5)
eGFR: 79 mL/min/{1.73_m2} (ref >59)

## 2021-09-24 LAB — LIPID PANEL WITH LDL/HDL RATIO
Cholesterol, Total: 173 mg/dL (ref 100–199)
HDL: 47 mg/dL (ref >39)
LDL Chol Calc (NIH): 109 mg/dL — ABNORMAL HIGH (ref 0–99)
LDL/HDL Ratio: 2.3 ratio (ref 0.0–3.2)
Triglycerides: 95 mg/dL (ref 0–149)
VLDL Cholesterol Cal: 17 mg/dL (ref 5–40)

## 2021-09-24 LAB — T4, FREE: Free T4: 1.32 ng/dL (ref 0.82–1.77)

## 2021-09-24 LAB — TSH: TSH: 4.2 u[IU]/mL (ref 0.450–4.500)

## 2021-09-24 LAB — HEMOGLOBIN A1C
Est. average glucose Bld gHb Est-mCnc: 206 mg/dL
Hgb A1c MFr Bld: 8.8 % — ABNORMAL HIGH (ref 4.8–5.6)

## 2021-09-24 LAB — VITAMIN B12: Vitamin B-12: 348 pg/mL (ref 232–1245)

## 2021-09-24 LAB — FOLATE: Folate: 12.3 ng/mL (ref >3.0)

## 2021-09-24 LAB — VITAMIN D 25 HYDROXY (VIT D DEFICIENCY, FRACTURES): Vit D, 25-Hydroxy: 13.8 ng/mL — ABNORMAL LOW (ref 30.0–100.0)

## 2021-09-28 ENCOUNTER — Ambulatory Visit: Payer: Medicaid Other | Admitting: Podiatry

## 2021-09-28 DIAGNOSIS — E1151 Type 2 diabetes mellitus with diabetic peripheral angiopathy without gangrene: Secondary | ICD-10-CM

## 2021-09-28 DIAGNOSIS — L600 Ingrowing nail: Secondary | ICD-10-CM

## 2021-10-01 NOTE — Progress Notes (Signed)
? ? ? ? ?Chief Complaint:  ? ?OBESITY ?Shylo Zamor (MR# [id number]) is a 51 y.o. female who presents for evaluation and treatment of obesity and related comorbidities. Current BMI is Body mass index is 42.4 kg/m?Marland Kitchen Amreen has been struggling with her weight for many years and has been unsuccessful in either losing weight, maintaining weight loss, or reaching her healthy weight goal. ? ?Elbony is currently in the action stage of change and ready to dedicate time achieving and maintaining a healthier weight. Porsche is interested in becoming our patient and working on intensive lifestyle modifications including (but not limited to) diet and exercise for weight loss. ? 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  She has a son, 21 years old, and a 58 year old daughter, and an 37 year old daughter.  Craves - chicken wings and snacks on fruit.  Worst habit is eating candy.  Oriinally from [place].  She ha previously seen [facility], but does not want surgery.  She used to see Colletta [place] there. ? ?Fe's habits were reviewed today and are as follows: Her family eats meals together, she thinks her family will eat healthier with her, her desired weight loss is 65 pounds, she started gaining weight after starting insulin, her heaviest weight ever was 245 pounds, she has significant food cravings issues, she snacks frequently in the evenings, she is frequently drinking liquids with calories, she frequently eats larger portions than normal, and she struggles with emotional eating. ? ?Depression Screen ?Sanam's Food and Mood (modified PHQ-9) score was 14. ? ? ?  09/23/2021  ? 10:42 AM  ?Depression screen PHQ 2/9  ?Decreased Interest 0  ?Down, Depressed, Hopeless 0  ?PHQ - 2 Score 0  ?Altered sleeping 3  ?Tired, decreased energy 1  ?Change in appetite 0  ?Feeling bad or failure about yourself  0  ?Trouble concentrating 0  ?Moving slowly or fidgety/restless 0  ?Suicidal thoughts 0  ?PHQ-9 Score 4  ?Difficult doing  work/chores Not difficult at all  ? ?Subjective:  ? ?1. Other fatigue ?Tinie denies daytime somnolence and reports waking up still tired. Patient has a history of symptoms of daytime fatigue, morning fatigue, morning headache, and snoring. Addylynn generally gets  sleep only on her 2 nights off of work , and states that she has generally restful sleep. Snoring is present. Apneic episodes are present. Epworth Sleepiness Score is 14.  ? ?2. Shortness of breath on exertion ?Megan notes increasing shortness of breath with exercising and seems to be worsening over time with weight gain. She notes getting out of breath sooner with activity than she used to. This has gotten worse recently. Denina denies shortness of breath at rest or orthopnea. ? ?3. Other specified hypothyroidism ?Management by Endo.  Asymptomatic.  She is taking Synthroid.  . ? ?4. Hypertension associated with diabetes ([place]) ?Review: taking medications as instructed, no medication side effects noted, no chest pain on exertion, no dyspnea on exertion, no swelling of ankles. Medications and management per Endo only.  On HCTZ and lisinopril.  She hs been on medications for 2 years.  She stats she does not want to have hypertension and the medicine causes her BP to go up, ? ?BP Readings from Last 3 Encounters:  ?09/23/21 137/84  ?08/13/21 (!) 144/87  ?04/17/21 (!) 144/85  ? ?5. Type 2 diabetes mellitus with other specified complication, with long-term current use of insulin ([place]) ?Diagnosed in 2012.  On insulin since 2017.  Management per Endo, Dr. Hartford Poli, [place].  A1c 8.2 in  9/22 was last time it was checked.  Has Dexcom CBGM.  She was unsure of FBS and PP BS.   ? ?Plan:  Poorly controlled. The patient will continue to focus on protein-rich, low simple carbohydrate foods. We reviewed the importance of hydration, regular exercise for stress reduction, and restorative sleep.  ? ?Lab Results  ?Component Value Date  ? HGBA1C 8.8 (H) 09/23/2021  ? HGBA1C 9.8  (A) 08/13/2021  ? HGBA1C 9.9 (H) 05/27/2017  ? ?Lab Results  ?Component Value Date  ? LDLCALC 109 (H) 09/23/2021  ? CREATININE 0.89 09/23/2021  ? ?6. Mixed diabetic hyperlipidemia associated with type 2 diabetes mellitus ([place]) ?Medication(s) reviewed. Patient denies myalgias. She is taking Crestor. ? ?Lab Results  ?Component Value Date  ? CHOL 173 09/23/2021  ? HDL 47 09/23/2021  ? LDLCALC 109 (H) 09/23/2021  ? TRIG 95 09/23/2021  ? CHOLHDL 5.0 (H) 10/20/2020  ? ?Lab Results  ?Component Value Date  ? ALT 21 09/23/2021  ? AST 19 09/23/2021  ? ALKPHOS 70 09/23/2021  ? BILITOT 0.4 09/23/2021  ? ?The 10-year ASCVD risk score (Arnett DK, et al., 2019) is: 10.8% ?  Values used to calculate the score: ?    Age: 47 years ?    Sex: Female ?    Is Non-Hispanic African American: Yes ?    Diabetic: Yes ?    Tobacco smoker: No ?    Systolic Blood Pressure: 803 mmHg ?    Is BP treated: Yes ?    HDL Cholesterol: 47 mg/dL ?    Total Cholesterol: 173 mg/dL ? ?7. Obstructive sleep apnea ?Diagnosed in 2020 but never returned for mask fitting.  ESS 14.   ? ?8. Screening for depression ?Tuyet was screened for depression as part of her new patient workup today.  PHQ-9 is 4. ? ?Assessment/Plan:  ? ?Orders Placed This Encounter  ?Procedures  ? Folate  ? Comprehensive metabolic panel  ? CBC with Differential/Platelet  ? Vitamin B12  ? Lipid Panel With LDL/HDL Ratio  ? T4, free  ? TSH  ? VITAMIN D 25 Hydroxy (Vit-D Deficiency, Fractures)  ? Hemoglobin A1c  ? EKG 12-Lead  ? ? ?Medications Discontinued During This Encounter  ?Medication Reason  ? levothyroxine (SYNTHROID, LEVOTHROID) 175 MCG tablet   ? naproxen sodium (ALEVE) 220 MG tablet   ? Multiple Vitamin (MULTIVITAMIN) tablet   ? fluticasone (FLONASE) 50 MCG/ACT nasal spray   ? cyclobenzaprine (FLEXERIL) 10 MG tablet   ? cetirizine (ZYRTEC) 10 MG tablet   ? cephALEXin (KEFLEX) 500 MG capsule   ? Blood Glucose Monitoring Suppl (ACCU-CHEK GUIDE ME) w/Device KIT   ?  ? ?No orders of the  defined types were placed in this encounter. ?  ? ?1. Other fatigue ?Addylynn does feel that her weight is causing her energy to be lower than it should be. Fatigue may be related to obesity, depression or many other causes. Labs will be ordered, and in the meanwhile, Krystalyn will focus on self care including making healthy food choices, increasing physical activity and focusing on stress reduction. ? ?- Folate ?- EKG 12-Lead ?- Vitamin B12 ?- VITAMIN D 25 Hydroxy (Vit-D Deficiency, Fractures) ? ?2. Shortness of breath on exertion ?Orva does feel that she gets out of breath more easily that she used to when she exercises. Orion's shortness of breath appears to be obesity related and exercise induced. She has agreed to work on weight loss and gradually increase exercise to treat her exercise  induced shortness of breath. Will continue to monitor closely. ? ?3. Other specified hypothyroidism ?Continue to follow with Endo.  Will check TSH and TS3 today. ? ?- T4, free ?- TSH ? ?4. Hypertension associated with diabetes ([place]) ?Tanee is working on healthy weight loss and exercise to improve blood pressure control. We will watch for signs of hypotension as she continues her lifestyle modifications. BP high normal range.  Counseling done on goal BP of <130/80 on a regular Basis.  Decrease sat, pneumonia/, and weight loss. ? ?- Comprehensive metabolic panel ? ?5. Type 2 diabetes mellitus with other specified complication, with long-term current use of insulin ([place]) ?Poorly controlled.  Good blood sugar control is important to decrease the likelihood of diabetic complications such as nephropathy, neuropathy, limb loss, blindness, coronary artery disease, and death. Intensive lifestyle modification including diet, exercise and weight loss are the first line of treatment for diabetes.  ? ?- CBC with Differential/Platelet ?- Hemoglobin A1c ? ?6. Mixed diabetic hyperlipidemia associated with type 2 diabetes mellitus  ([place]) ?Cardiovascular risk and specific lipid/LDL goals reviewed.  We discussed several lifestyle modifications today and Dail will continue to work on diet, exercise and weight loss efforts. Orders and follo

## 2021-10-05 ENCOUNTER — Telehealth: Payer: Self-pay | Admitting: Nurse Practitioner

## 2021-10-05 NOTE — Progress Notes (Signed)
Subjective: ?51 year old female presents the office today for follow-up evaluation of left hallux toenail infection, nail removal.  She states that she is doing much better.  She did not see any swelling or redness or any drainage.  She has no pain.  She still been cleaning the toe and applying antibiotic ointment and a dressing. ?Denies any systemic complaints such as fevers, chills, nausea, vomiting. No acute changes since last appointment, and no other complaints at this time.  ? ?Objective: ?AAO x3, NAD ?DP/PT pulses palpable bilaterally, CRT less than 3 seconds ?Status post total nail avulsion left hallux due to infection.  Appears the wound is healed.  No probing, undermining or tunneling.  There is no surrounding erythema, ascending cellulitis.  No fluctuation or crepitation.  There is no malodor.  No pain with calf compression, swelling, warmth, erythema ? ?Assessment: ?Status post total nail avulsion  ? ?Plan: ?-All treatment options discussed with the patient including all alternatives, risks, complications.  ?-Appears that the wound is healed.  Recommend clean with soap and water daily.  Apply a small amount of antibiotic ointment during the day but leave the area open at nighttime.  We will need to monitor the nail as it grows back in.  Should it cause any issues are becoming thick or discolored let me know. ?-Daily foot inspection, glucose control. ?-Patient encouraged to call the office with any questions, concerns, change in symptoms.  ? ?Lindsey Bowen DPM ? ?

## 2021-10-05 NOTE — Telephone Encounter (Signed)
Copied from [place] [phone]. Topic: Appointment Scheduling - Scheduling Inquiry for Clinic ?>> Oct 05, 2021 10:25 AM Oneta Rack wrote: ?Reason for CRM: Weight Management Doctor is requesting PCP to review patient most recent lab work, and recommend patient to follow up with PCP sooner then later also specialist recommended patient to have a sleep study. Patient wants to see PCP only ?

## 2021-10-07 ENCOUNTER — Encounter (INDEPENDENT_AMBULATORY_CARE_PROVIDER_SITE_OTHER): Payer: Self-pay | Admitting: Family Medicine

## 2021-10-07 ENCOUNTER — Ambulatory Visit (INDEPENDENT_AMBULATORY_CARE_PROVIDER_SITE_OTHER): Payer: Medicaid Other | Admitting: Family Medicine

## 2021-10-07 ENCOUNTER — Ambulatory Visit
Admission: RE | Admit: 2021-10-07 | Discharge: 2021-10-07 | Disposition: A | Discharge status: Home / Self Care | Payer: Medicaid Other | Source: Ambulatory Visit | Attending: Surgery | Admitting: Surgery

## 2021-10-07 VITALS — BP 145/82 | HR 83 | Temp 98.6°F | Ht 64.0 in

## 2021-10-07 DIAGNOSIS — E559 Vitamin D deficiency, unspecified: Secondary | ICD-10-CM

## 2021-10-07 DIAGNOSIS — E876 Hypokalemia: Secondary | ICD-10-CM | POA: Diagnosis not present

## 2021-10-07 DIAGNOSIS — Z794 Long term (current) use of insulin: Secondary | ICD-10-CM | POA: Diagnosis not present

## 2021-10-07 DIAGNOSIS — E669 Obesity, unspecified: Secondary | ICD-10-CM

## 2021-10-07 DIAGNOSIS — K862 Cyst of pancreas: Secondary | ICD-10-CM | POA: Diagnosis not present

## 2021-10-07 DIAGNOSIS — Z6841 Body Mass Index (BMI) 40.0 and over, adult: Secondary | ICD-10-CM

## 2021-10-07 DIAGNOSIS — K5900 Constipation, unspecified: Secondary | ICD-10-CM | POA: Diagnosis not present

## 2021-10-07 DIAGNOSIS — K5730 Diverticulosis of large intestine without perforation or abscess without bleeding: Secondary | ICD-10-CM | POA: Diagnosis not present

## 2021-10-07 DIAGNOSIS — E1169 Type 2 diabetes mellitus with other specified complication: Secondary | ICD-10-CM | POA: Diagnosis not present

## 2021-10-07 DIAGNOSIS — N433 Hydrocele, unspecified: Secondary | ICD-10-CM

## 2021-10-07 DIAGNOSIS — R110 Nausea: Secondary | ICD-10-CM | POA: Diagnosis not present

## 2021-10-07 MED ORDER — IOPAMIDOL (ISOVUE-300) INJECTION 61%
100.0000 mL | Freq: Once | INTRAVENOUS | Status: AC | PRN
  Administered 2021-10-07: 100 mL via INTRAVENOUS

## 2021-10-07 MED ORDER — VITAMIN D (ERGOCALCIFEROL) 1.25 MG (50000 UNIT) PO CAPS: 50000.0000 [IU] | ORAL_CAPSULE | ORAL | 0 refills | Status: DC

## 2021-10-07 NOTE — Telephone Encounter (Signed)
Scheduled virtual apt for Tuesday April 18th at 0850 ? ?

## 2021-10-07 NOTE — Telephone Encounter (Signed)
Virtual for Tuesday. Thanks. I will speak with her about her vit d. The other labs have already been addressed ?

## 2021-10-13 ENCOUNTER — Encounter: Payer: Self-pay | Admitting: Nurse Practitioner

## 2021-10-13 ENCOUNTER — Telehealth (HOSPITAL_BASED_OUTPATIENT_CLINIC_OR_DEPARTMENT_OTHER): Payer: Medicaid Other | Admitting: Nurse Practitioner

## 2021-10-13 DIAGNOSIS — I10 Essential (primary) hypertension: Secondary | ICD-10-CM

## 2021-10-13 DIAGNOSIS — E1149 Type 2 diabetes mellitus with other diabetic neurological complication: Secondary | ICD-10-CM

## 2021-10-13 DIAGNOSIS — E876 Hypokalemia: Secondary | ICD-10-CM

## 2021-10-13 MED ORDER — VALSARTAN 40 MG PO TABS: 20.0000 mg | ORAL_TABLET | Freq: Every day | ORAL | 1 refills | Status: DC

## 2021-10-13 NOTE — Progress Notes (Signed)
Virtual Visit Note ? I discussed the limitations, risks, security and privacy concerns of performing an evaluation and management service by video and the availability of in person appointments. I also discussed with the patient that there may be a patient responsible charge related to this service. The patient expressed understanding and agreed to proceed.  ? ? ?I connected with Lindsey Bowen on 10/13/21  at   8:50 AM EDT  EDT by VIDEO and verified that I am speaking with the correct person using two identifiers. ? ? ?Location of Patient: ?Private Residence ?  ?Location of Provider: ?[employer] [facility] [employer] [facility]  ?  ?Persons participating in VIRTUAL visit: ?[name] FNP-BC ?Lindsey Bowen  ?  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  ? ?She made an appointment with me for today after being instructed to follow up with PCP regarding blood work results obtained by family medicine. ?Potassium is below normal. She is taking HCTZ prn for BLE edema (not sure who instructed her to take this prn) and lisinopril 5 mg. Will dc both and start low dose valsartan today. Will have her return in a few weeks for BP check and she will need potassium rechecked in 10-14 days.  ? ?She endorses being very frustrated with taking so many medications and not seeing any positive results with her health.  ?She is skipping meals which I instructed her is not ideal in regard to weight loss or controlling her diabetes. She is not exercising. Works night shift which is not ideal for cardiovascular health and her  diabetes. She is also not taking her cholesterol medication as prescribed. Declines bariatric surgery. BMI 42 ? ?She was recently started on Vit D by [facility] due to vit d deficiency. ?Lab Results  ?Component Value Date  ? VD25OH 13.8 (L) 09/23/2021  ?  ? ? ?DM 2 ?She is well overdue for her appointment with endocrinology and diabetes is poorly controlled. There may be a component of dietary non adherence as well.  ?Lab Results  ?Component Value Date  ? HGBA1C 8.8 (H) 09/23/2021  ?  ?HTN ?Blood pressure is not optimal. I asked her to check her blood pressure during the video visit today and then she stated she could not find her device at the moment and was unable to check her blood pressure.  ?BP Readings from Last 3 Encounters:  ?10/07/21 (!) 145/82  ?09/23/21 137/84  ?08/13/21 (!) 144/87  ?  ? ?Past Medical History:  ?Diagnosis Date  ? Anemia   ? Anxiety   ? Asthma   ? Bilateral edema of lower extremity   ? Chronic pain of left knee   ? Constipation   ? Depression   ? Diabetes mellitus type 2 with neurological manifestations ([facility]) 05/27/2017  ? Diabetes mellitus without complication ([facility])   ? Gallstones   ? Hypothyroidism   ? Joint pain   ? Lumbar pain   ? Migraine   ? Onychodystrophy   ? Onychomycosis   ? Other fatigue   ? Paresthesia 05/17/2017  ? Shortness of breath on exertion   ? Sickle cell trait ([facility])   ? Sleep apnea   ? TIA (transient  ischemic attack) 05/26/2017  ? 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  ?  ?Past Surgical History:  ?Procedure Laterality Date  ? CHOLECYSTECTOMY    ? DILATION AND CURETTAGE OF UTERUS    ? HERNIA REPAIR    ?  ?Family History  ?Problem Relation Age of Onset  ? Diabetes Mother   ? Hypertension Mother   ? Addison's disease Mother   ? Asthma Mother   ? Colon polyps Mother   ? Irritable bowel syndrome Mother   ? Glaucoma Father   ? Mental retardation Sister   ? Glaucoma Sister   ? Heart attack Maternal Aunt   ? Heart attack Maternal  Uncle   ? Diabetes Paternal Uncle   ? Heart attack Maternal Grandmother   ? Stroke Maternal Grandmother   ? Ulcerative colitis Maternal Grandfather   ? Diabetes Maternal Grandfather   ? Stomach cancer Maternal Grandfather   ? Diabetes Paternal Grandmother   ? Irritable bowel syndrome Daughter   ? Diabetes Son   ? Colon cancer Neg Hx   ? Esophageal cancer Neg Hx   ? Rectal cancer Neg Hx   ?  ?Social History  ? ?Socioeconomic History  ? Marital status: Single  ?  Spouse name: Not on file  ? Number of children: 3  ? Years of education: Not on file  ? Highest education level: Not on file  ?Occupational History  ? Occupation: OncologistMedical Courrier  ?  Employer: [employer]  ?  Comment: Works nights  ?Tobacco Use  ? Smoking status: Never  ? Smokeless tobacco: Never  ?Vaping Use  ? Vaping Use: Never used  ?Substance and Sexual Activity  ? Alcohol use: No  ? Drug use: Not Currently  ? Sexual activity: Yes  ?  Partners: Male  ?  Birth control/protection: I.U.D.  ?  Comment: [place]  ?Other Topics Concern  ? Not on file  ?Social History Narrative  ? Lives   ? Caffeine use:   ? ?Social Determinants of Health  ? ?Financial Resource Strain: Not on file  ?Food Insecurity: Not on file  ?Transportation Needs: Not on file  ?Physical Activity: Not on file  ?Stress: Not on file  ?Social Connections: Not on file  ?  ? ?Observations/Objective: ?Awake, alert and oriented x 3 ? ? ?Review of Systems  ?Constitutional:  Negative for fever, malaise/fatigue and weight loss.  ?HENT: Negative.  Negative for nosebleeds.   ?Eyes: Negative.  Negative for blurred vision, double vision and photophobia.  ?Respiratory: Negative.  Negative for cough and shortness of breath.   ?Cardiovascular: Negative.  Negative for chest pain, palpitations and leg swelling.  ?Gastrointestinal: Negative.  Negative for heartburn, nausea and vomiting.  ?Musculoskeletal: Negative.  Negative for myalgias.  ?Neurological: Negative.  Negative for dizziness, focal weakness,  seizures and headaches.  ?Psychiatric/Behavioral: Negative.  Negative for suicidal ideas.    ?Assessment and Plan: ?Diagnoses and all orders for this visit: ? ?Hypokalemia ?-     Potassium; Future ? ?Diabetes mellitus type 2 with neurological manifestations  ?Continue blood sugar control as discussed in office today, low carbohydrate diet, and regular physical exercise as tolerated, 150 minutes per week (30 min each day, 5 days per week, or 50 min 3 days per week). Keep blood sugar logs with fasting goal of 90-130 mg/dl, post prandial (after you eat) less than 180.  ?For Hypoglycemia: BS <60 and Hyperglycemia BS >400; contact the clinic ASAP. ? ?Primary hypertension ?-  valsartan (DIOVAN) 40 MG tablet; Take 0.5 tablets (20 mg total) by mouth daily. Take half a tablet daily. DO NOT TAKE A WHOLE TABLET ?DASH DIET ?Monitor blood pressure at home and bring log to office visit ? ?  ? ?Follow Up Instructions ?Return in about 1 month (around 11/12/2021) for BP recheck.  ? ?  ?I discussed the assessment and treatment plan with the patient. The patient was provided an opportunity to ask questions and all were answered. The patient agreed with the plan and demonstrated an understanding of the instructions. ?  ?The patient was advised to call back or seek an in-person evaluation if the symptoms worsen or if the condition fails to improve as anticipated. ? ?I provided 13 minutes of face-to-face time during this encounter including median intraservice time, reviewing previous notes, labs, imaging, medications and explaining diagnosis and management. ? ?Claiborne Rigg, FNP-BC  ? ?

## 2021-10-14 NOTE — Progress Notes (Signed)
? ? ? ?Chief Complaint:  ? ?OBESITY ?Lindsey Bowen is here to discuss her progress with her obesity treatment plan along with follow-up of her obesity related diagnoses. Lindsey Bowen is on the Category 3 Plan and states she is following her eating plan approximately 0% of the time. Lindsey Bowen states she is doing 0 minutes 0 times per week. ? ?Today's visit was #: 2 ?Starting weight: 247 lbs ?Starting date: 09/23/2021 ?Today's weight: 247 lbs ?Today's date: 10/07/2021 ?Total lbs lost to date: 0 ?Total lbs lost since last in-office visit: 0 ? ?Interim History: Lindsey Bowen was unable to start her eating plan due to needing to wait to buy groceries. She is ready to go shopping now and get started.  ? ?Subjective:  ? ?1. Hypokalemia ?Lindsey Bowen takes her hydrochlorothiazide intermittently. She has had low K+ in the past. I discussed labs with the patient today.  ? ?2. Vitamin D deficiency ?Lindsey Bowen Vitamin D level is low and she notes fatigue. She is not on Vitamin D supplement. I discussed labs with the patient today.  ? ?3. Type 2 diabetes mellitus with other specified complication, with long-term current use of insulin (Lindsey Bowen) ?Lindsey Bowen is on mealtime insulin only. She has a Dexcom and she is followed by Endocrinology. She is getting ready to start her eating plan. I discussed labs with the patient today.  ? ?Assessment/Plan:  ? ?1. Hypokalemia ?Nil is to hold hydrochlorothiazide and make sure her PCP is ok with this. We will continue to monitor.  ? ?2. Vitamin D deficiency ?Low Vitamin D level contributes to fatigue and are associated with obesity, breast, and colon cancer. Lindsey Bowen agreed to start prescription Vitamin D 50,000 IU every week with no refills. She will follow-up for routine testing of Vitamin D, at least 2-3 times per year to avoid over-replacement. ? ?- Vitamin D, Ergocalciferol, (DRISDOL) 1.25 MG (50000 UNIT) CAPS capsule; Take 1 capsule (50,000 Units total) by mouth every 7 (seven) days.  Dispense: 4 capsule; Refill:  0 ? ?3. Type 2 diabetes mellitus with other specified complication, with long-term current use of insulin (Lindsey Bowen) ?Lindsey Bowen is to watch for low glucose while on her eating plan, and will follow closely. Good blood sugar control is important to decrease the likelihood of diabetic complications such as nephropathy, neuropathy, limb loss, blindness, coronary artery disease, and death. Intensive lifestyle modification including diet, exercise and weight loss are the first line of treatment for diabetes.  ? ?4. Obesity with current BMI of 42.5 ?Lindsey Bowen is currently in the action stage of change. As such, her goal is to continue with weight loss efforts. She has agreed to the Category 3 Plan.  ? ?Behavioral modification strategies: meal planning and cooking strategies. ? ?Lindsey Bowen has agreed to follow-up with our clinic in 2 weeks. She was informed of the importance of frequent follow-up visits to maximize her success with intensive lifestyle modifications for her multiple health conditions.  ? ?Objective:  ? ?Blood pressure (!) 145/82, pulse 83, temperature 98.6 ?F (37 ?C), height 5\' 4"  (1.626 m), SpO2 98 %. ?Body mass index is 42.4 kg/m?. ? ?General: Cooperative, alert, well developed, in no acute distress. ?HEENT: Conjunctivae and lids unremarkable. ?Cardiovascular: Regular rhythm.  ?Lungs: Normal work of breathing. ?Neurologic: No focal deficits.  ? ?Lab Results  ?Component Value Date  ? CREATININE 0.89 09/23/2021  ? BUN 10 09/23/2021  ? NA 145 (H) 09/23/2021  ? K 3.1 (L) 09/23/2021  ? CL 101 09/23/2021  ? CO2 30 (H) 09/23/2021  ? ?Lab Results  ?  Component Value Date  ? ALT 21 09/23/2021  ? AST 19 09/23/2021  ? ALKPHOS 70 09/23/2021  ? BILITOT 0.4 09/23/2021  ? ?Lab Results  ?Component Value Date  ? HGBA1C 8.8 (H) 09/23/2021  ? HGBA1C 9.8 (A) 08/13/2021  ? HGBA1C 9.9 (H) 05/27/2017  ? ?No results found for: INSULIN ?Lab Results  ?Component Value Date  ? TSH 4.200 09/23/2021  ? ?Lab Results  ?Component Value Date  ? CHOL  173 09/23/2021  ? HDL 47 09/23/2021  ? LDLCALC 109 (H) 09/23/2021  ? TRIG 95 09/23/2021  ? CHOLHDL 5.0 (H) 10/20/2020  ? ?Lab Results  ?Component Value Date  ? VD25OH 13.8 (L) 09/23/2021  ? VD25OH 19.1 (L) 03/28/2020  ? ?Lab Results  ?Component Value Date  ? WBC 6.8 09/23/2021  ? HGB 12.3 09/23/2021  ? HCT 39.1 09/23/2021  ? MCV 72 (L) 09/23/2021  ? PLT 305 09/23/2021  ? ?Lab Results  ?Component Value Date  ? IRON 52 01/29/2021  ? FERRITIN 59.7 01/29/2021  ? FERRITIN 59.7 01/29/2021  ? ?Attestation Statements:  ? ?Reviewed by clinician on day of visit: allergies, medications, problem list, medical history, surgical history, family history, social history, and previous encounter notes. ? ? ?I, Trixie Dredge, am acting as transcriptionist for Dennard Nip, MD. ? ?I have reviewed the above documentation for accuracy and completeness, and I agree with the above. -  Dennard Nip, MD ? ? ?

## 2021-10-21 ENCOUNTER — Ambulatory Visit (INDEPENDENT_AMBULATORY_CARE_PROVIDER_SITE_OTHER): Payer: Medicaid Other | Admitting: Family Medicine

## 2021-10-21 ENCOUNTER — Encounter (INDEPENDENT_AMBULATORY_CARE_PROVIDER_SITE_OTHER): Payer: Self-pay | Admitting: Family Medicine

## 2021-10-21 VITALS — BP 152/90 | HR 81 | Temp 97.9°F | Ht 64.0 in | Wt 245.0 lb

## 2021-10-21 DIAGNOSIS — Z6841 Body Mass Index (BMI) 40.0 and over, adult: Secondary | ICD-10-CM | POA: Diagnosis not present

## 2021-10-21 DIAGNOSIS — E669 Obesity, unspecified: Secondary | ICD-10-CM

## 2021-10-21 DIAGNOSIS — E559 Vitamin D deficiency, unspecified: Secondary | ICD-10-CM | POA: Diagnosis not present

## 2021-10-21 DIAGNOSIS — I152 Hypertension secondary to endocrine disorders: Secondary | ICD-10-CM

## 2021-10-21 DIAGNOSIS — Z794 Long term (current) use of insulin: Secondary | ICD-10-CM

## 2021-10-21 DIAGNOSIS — E1159 Type 2 diabetes mellitus with other circulatory complications: Secondary | ICD-10-CM

## 2021-10-21 DIAGNOSIS — E1169 Type 2 diabetes mellitus with other specified complication: Secondary | ICD-10-CM

## 2021-10-21 DIAGNOSIS — E118 Type 2 diabetes mellitus with unspecified complications: Secondary | ICD-10-CM | POA: Insufficient documentation

## 2021-10-21 MED ORDER — VITAMIN D (ERGOCALCIFEROL) 1.25 MG (50000 UNIT) PO CAPS: 50000.0000 [IU] | ORAL_CAPSULE | ORAL | 0 refills | Status: DC

## 2021-10-22 ENCOUNTER — Other Ambulatory Visit: Payer: Self-pay | Admitting: Surgery

## 2021-10-22 DIAGNOSIS — K7689 Other specified diseases of liver: Secondary | ICD-10-CM

## 2021-10-23 ENCOUNTER — Other Ambulatory Visit: Payer: Self-pay

## 2021-10-23 DIAGNOSIS — R509 Fever, unspecified: Secondary | ICD-10-CM | POA: Diagnosis not present

## 2021-10-23 DIAGNOSIS — R399 Unspecified symptoms and signs involving the genitourinary system: Secondary | ICD-10-CM | POA: Diagnosis not present

## 2021-10-23 DIAGNOSIS — R300 Dysuria: Secondary | ICD-10-CM | POA: Diagnosis not present

## 2021-10-23 NOTE — Patient Outreach (Signed)
Call 1st Attempt at 02:59pm - LVM for patient to call back.  ? ?Call 2nd Attempt at 03:12pm - LVM for patient to call back.  ? ? ?

## 2021-10-26 ENCOUNTER — Encounter: Payer: Self-pay | Admitting: Family Medicine

## 2021-10-26 ENCOUNTER — Other Ambulatory Visit: Payer: Self-pay | Admitting: Pharmacist

## 2021-10-26 ENCOUNTER — Other Ambulatory Visit (HOSPITAL_COMMUNITY)
Admission: RE | Admit: 2021-10-26 | Discharge: 2021-10-26 | Disposition: A | Discharge status: Home / Self Care | Payer: Medicaid Other | Source: Ambulatory Visit | Attending: Family Medicine | Admitting: Family Medicine

## 2021-10-26 ENCOUNTER — Ambulatory Visit (INDEPENDENT_AMBULATORY_CARE_PROVIDER_SITE_OTHER): Payer: Medicaid Other

## 2021-10-26 ENCOUNTER — Ambulatory Visit: Payer: Self-pay

## 2021-10-26 VITALS — BP 136/67 | HR 89 | Ht 64.0 in | Wt 248.9 lb

## 2021-10-26 DIAGNOSIS — N898 Other specified noninflammatory disorders of vagina: Secondary | ICD-10-CM

## 2021-10-26 DIAGNOSIS — E118 Type 2 diabetes mellitus with unspecified complications: Secondary | ICD-10-CM

## 2021-10-26 DIAGNOSIS — I10 Essential (primary) hypertension: Secondary | ICD-10-CM

## 2021-10-26 MED ORDER — FLUCONAZOLE 150 MG PO TABS: 150.0000 mg | ORAL_TABLET | Freq: Once | ORAL | 0 refills | Status: AC

## 2021-10-26 NOTE — Progress Notes (Signed)
Pt here today for self swab.  Pt reports that she has went for about a week without taking her insulin and her diabetes became out of control.  Pt states every since she has had irritation with itchiness down her thighs and in the groin area.  I advised pt to use anti-fungal cream, Monistat, and/or hydrocortisone cream.  I also advised pt that we could send in a Diflucan tablet as well to see that will treat it.  Pt stated that she would like to treat it.   Pt advised on how to obtain self swab and that we will call with abnormal results.  Pt verbalized understanding.   ? ?Rekisha Welling,RN  ?10/26/21 ?

## 2021-10-26 NOTE — Chronic Care Management (AMB) (Signed)
? ?   ? ?Chief Complaint  ?Patient presents with  ? Hypertension  ? Medication Management  ? ? ?Lindsey Bowen is a 51 y.o. year old female who was referred for medication management by their primary care provider, Claiborne Rigg, NP. They presented for a telephone visit. ?  ?They were referred to the pharmacist by a quality report for assistance in managing diabetes and hypertension.  ? ?Subjective: ? ?Care Team: ?Primary Care Provider: Claiborne Rigg, NP ; Next Scheduled Visit: 11/11/21 ?Weight Management: [facility]; Next Scheduled Visit: 11/05/21 ?Endocrinology: Dr Shawnee Knapp [facility]); Next Scheduled Visit 02/04/22 ? ?Medication Access/Adherence ? ?Current Pharmacy:  ?[facility] - [place], [place] - [address] ?[address] ?[place] [address] ?Phone: [phone] Fax: [phone] ? ? ?Patient reports affordability concerns with their medications: Yes  - sometimes, uses charge account at [facility] ?Patient reports access/transportation concerns to their pharmacy: No  ?Patient reports adherence concerns with their medications:  Yes  overwhelmed and frustrated by number of medications and lack of improvement in health ? ? ?Diabetes: ? ?Current medications: Humulin R U500 85 units with breakfast, 60 units with lunch and supper ?Medications tried in the past: metformin IR and XR - GI upset; Victoza - injection site reaction; Trulicity - tolerated, but little clinical benefit on most recent trial ? ?Using DexCom G6 CGM, but we did not review recent readings today. Does report episodes of hypoglycemia when she doesn't feel like eating much. Also reports high readings when she doesn't eat ? ?Current meal patterns: works 3rd shift. Tries to eat "breakfast" meal around 9 am after work; "lunch" at work ~ 5-6 pm; supper around 3-4 am ? ?Hypertension: ? ?Current medications: valsartan 40 mg daily - prescribed 20 mg, but Dr. Marcelino Duster instructed to increase  ?Medications  previously tried:  ? ?Patient has an automated, upper arm home BP cuff but she needs to find it.  ? ?Reports she is tolerating current regimen ? ? ?Hyperlipidemia/ASCVD Risk Reduction ? ?Current lipid lowering medications: rosuvastatin 20 mg daily - though reports she lapses in taking this one ? ?ASCVD History: hx TIA ? ? ?Health Maintenance ? ?Health Maintenance Due  ?Topic Date Due  ? COVID-19 Vaccine (1) Never done  ? OPHTHALMOLOGY EXAM  Never done  ? FOOT EXAM  12/16/2020  ?  ? ?Objective: ?Lab Results  ?Component Value Date  ? HGBA1C 8.8 (H) 09/23/2021  ? ? ?Lab Results  ?Component Value Date  ? CREATININE 0.89 09/23/2021  ? BUN 10 09/23/2021  ? NA 145 (H) 09/23/2021  ? K 3.1 (L) 09/23/2021  ? CL 101 09/23/2021  ? CO2 30 (H) 09/23/2021  ? ? ?Lab Results  ?Component Value Date  ? CHOL 173 09/23/2021  ? HDL 47 09/23/2021  ? LDLCALC 109 (H) 09/23/2021  ? TRIG 95 09/23/2021  ? CHOLHDL 5.0 (H) 10/20/2020  ? ? ?Medications Reviewed Today   ? ? Reviewed by Alden Hipp, RPH-CPP (Pharmacist) on 10/26/21 at 1039  Med List Status: <None>  ? ?Medication Order Taking? Sig Documenting Provider Last Dose Status Informant  ?acetaminophen (TYLENOL) 650 MG CR tablet [id number] Yes Take 650 mg by mouth every 8 (eight) hours as needed for pain. [provider] Taking Active Self  ?aspirin-acetaminophen-caffeine (EXCEDRIN MIGRAINE) [phone] MG tablet [id number] Yes Take 2 tablets by mouth every 6 (six) hours as needed for headache. [provider] Taking Active   ?BD PEN NEEDLE NANO 2ND GEN 32G X 4 MM  MISC [id number] Yes ONE EACH BY OTHER ROUTE DAILY. [provider] Taking Active   ?Blood Pressure Monitor DEVI [id number] Yes Please provide patient with insurance approved blood pressure monitor ICD I 10.0 Claiborne Rigg, NP Taking Active   ?Continuous Blood Gluc Sensor (DEXCOM G6 SENSOR) MISC [id number] Yes 1 each by Other route See admin instructions. Use one sensor once every 10 days to monitor  blood sugars. [provider] Taking Active   ?Continuous Blood Gluc Transmit (DEXCOM G6 TRANSMITTER) MISC [id number] Yes 1 each by Other route See admin instructions. Use one transmitter once every 90 days to transmit blood sugars. [provider] Taking Active   ?EPINEPHrine 0.3 mg/0.3 mL IJ SOAJ injection [id number]  Inject 0.3 mg into the muscle as needed for anaphylaxis. Claiborne Rigg, NP  Active   ?         ?Med Note Artemio Aly, Mariane Masters Sep 23, 2021 10:29 AM)    ?fluticasone (FLONASE) 50 MCG/ACT nasal spray [id number] Yes Place 2 sprays into both nostrils daily as needed for allergies or rhinitis. [provider] Taking Active   ?ibuprofen (ADVIL) 200 MG tablet [id number] Yes Take 400 mg by mouth every 6 (six) hours as needed. [provider] Taking Active   ?insulin regular human CONCENTRATED (HUMULIN R U-500 KWIKPEN) 500 UNIT/ML Stephanie Coup [id number] Yes Inject into the skin 3 (three) times daily with meals. Inject 85 units under the skin before breakfast and 60 units before lunch and dinner. [provider] Taking Active   ?         ?Med Note Artemio Aly, Mariane Masters Sep 23, 2021 10:23 AM)    ?levocetirizine (XYZAL) 5 MG tablet [id number] Yes Take 5 mg by mouth daily as needed. [provider] Taking Active   ?levonorgestrel (LILETTA, 52 MG,) 20.1 MCG/DAY IUD [id number] Yes 1 each by Intrauterine route once. [provider] Taking Active   ?levothyroxine (SYNTHROID) 175 MCG tablet [id number] Yes Take 175 mcg by mouth daily before breakfast. Take 1 tablet by mouth 6 days per week. [provider] Taking Active   ?methocarbamol (ROBAXIN) 500 MG tablet [id number] Yes Take 500 mg by mouth daily as needed for muscle spasms. [provider] Taking Active   ?PROAIR HFA 108 (90 Base) MCG/ACT inhaler [id number] Yes INHALE 1-2 PUFFS BY MOUTH EVERY 6 HOURS AS NEEDED FOR WHEEZE OR SHORTNESS OF Tonette Bihari, NP Taking Active   ?rosuvastatin  (CRESTOR) 20 MG tablet [id number] No Take 20 mg by mouth at bedtime.  ?Patient not taking: Reported on 10/26/2021  ? [provider] Not Taking Active   ?valsartan (DIOVAN) 40 MG tablet [id number] Yes Take 0.5 tablets (20 mg total) by mouth daily. Take half a tablet daily. DO NOT TAKE A WHOLE TABLET Claiborne Rigg, NP Taking Active   ?         ?Med Note Raeanne Gathers Oct 26, 2021 10:39 AM) Taking 40 mg per Dr. Marcelino Duster  ?Vitamin D, Ergocalciferol, (DRISDOL) 1.25 MG (50000 UNIT) CAPS capsule [id number] Yes Take 1 capsule (50,000 Units total) by mouth every 7 (seven) days. Thomasene Lot, DO Taking Active   ? ?  ?  ? ?  ? ? ?Assessment/Plan:  ?Care Plan : Medication Management  ?Updates made by Alden Hipp, RPH-CPP since 10/26/2021 12:00 AM  ?  ? ?Problem: Diabetes   ?  ? ?Goal: A1C <7%   ?Note:   ?Current Barriers:  ?  Suboptimal therapeutic regimen for diabetes ? ?Patient Needs: ?Trial of GLP1 ? ?Patient Activities: ?Patient will:  ?- take medications as prescribed as evidenced by patient report and record review ?check glucose using CGM, document, and provide at future appointments ? ?  ? ?Problem: Hypertension   ?  ? ?Goal: BP <130/80   ?Note:   ?Current Barriers:  ?Unable to achieve control of blood pressure  ? ?Patient Needs: ?Updated prescription ? ?Patient Activities: ?Patient will:  ?- take medications as prescribed as evidenced by patient report and record review ?- check blood pressure at least once weekly, document, and provide at future visits ? ?  ? ?Problem: Hyperlipidemia   ?  ? ?Goal: LDL <70   ?  ? ? ?Diabetes: ?- Currently uncontrolled ?- Reviewed long term cardiovascular and renal outcomes of uncontrolled blood sugar ?- Reviewed goal A1c, goal fasting, and goal 2 hour post prandial glucose ?- Recommend to discuss re-trial of GLP1 with Ozempic with her medical team. Hopefully, tolerability will allow use and promote weight loss, improved glucose, reduction in insulin burden.  She does not want to have to wait until next endocrinology appointment in August to make this change. I will message her PCP and Weight Management providers on her behalf. ?- Recommend to check glucose con

## 2021-10-26 NOTE — Patient Instructions (Addendum)
It was great to speak with you today! ? ?I recommend we talk with your team about starting Ozempic. This is a weekly medication in the same class as Victoza and Trulicity, but is generally more effective than Trulicity. This can help with weight loss and reducing how much insulin you require.  ? ?Please restart rosuvastatin to help lower your LDL to our goal <70 and protect you from future heart attacks or strokes. You can actually take this medication at any time of the day, so take it whenever it best fits into your routines.  ? ?I will message Dr. Raliegh Scarlet about changing the prescription for valsartan to reflect the increased dose that she recommended.  ? ?Check your blood pressure 1-2 times weekly. Our goal is less than 130/80 ? ?We recommend a blood pressure cuff that goes around your upper arm, as these are generally the most accurate.  ? ?To appropriately check your blood pressure, make sure you do the following:  ?1) Avoid caffeine, exercise, or tobacco products for 30 minutes before checking. ?2) Sit with your back supported in a flat-backed chair. Rest your arm on something flat (arm of the chair, table, etc). ?3) Sit still with your feet flat on the floor, resting, for at least 5 minutes.  ?4) Check your blood pressure. Take 1-2 readings.  ? ?Write down these readings and bring with you to any provider appointments.  ?Bring your home blood pressure machine with you to a provider's office for accuracy comparison at least once a year.  ?Make sure you take your blood pressure medications before you come to any office visit. ? ? ?Below is transportation information from your [facility] plan.  ? ?Please reach out if you need anything before our next call. Thanks! ? ?Catie Hedwig Morton, PharmD, BCACP ?[place] ?[phone] ? ? ?Transportation Information ? ?If you are experiencing a medical emergency, please call 911 or report to your local emergency department or urgent care.  ? ?If you  have a non-emergency medical problem during routine business hours, please contact your provider's office and ask to speak with a nurse.  ? ?For questions related to your [facility] health plan, please call: [phone] or visit the homepage here: [email or web address] ? ?If you would like to schedule transportation through your [facility] plan, please call the following number at least 2 days in advance of your appointment: [phone] ? For information about your ride after you set it up, call Ride Assist at [phone]. Use this number to activate a Will Call pickup, or if your transportation is late for a scheduled pickup. Use this number, too, if you need to make a change or cancel a previously scheduled reservation. ? If you need transportation services right away, call [phone]. The after-hours call center is staffed 24 hours to handle ride assistance and urgent reservation requests (including discharges) 365 days a year. Urgent trips include sick ?  ?

## 2021-10-27 LAB — CERVICOVAGINAL ANCILLARY ONLY
Bacterial Vaginitis (gardnerella): NEGATIVE
Candida Glabrata: NEGATIVE
Candida Vaginitis: POSITIVE — AB
Chlamydia: NEGATIVE
Comment: NEGATIVE
Comment: NEGATIVE
Comment: NEGATIVE
Comment: NEGATIVE
Comment: NEGATIVE
Comment: NORMAL
Neisseria Gonorrhea: NEGATIVE
Trichomonas: NEGATIVE

## 2021-10-28 ENCOUNTER — Telehealth: Payer: Self-pay

## 2021-10-28 NOTE — Telephone Encounter (Addendum)
-----   Message from Brand Males, CNM sent at 10/27/2021  6:08 PM EDT ----- ?Patient has a yeast infection. Can you send in terazol cream to her pharmacy? ? ?Thanks! ?Danielle, CNM ? ? ?Called pt; VM left stating I am calling with new recommendations. Callback number given. MyChart message sent. Pt desires treatment. Terazol not covered by insurance. Simpson, CNM states Miconazole is appropriate alternative.  ?

## 2021-11-01 ENCOUNTER — Ambulatory Visit
Admission: RE | Admit: 2021-11-01 | Discharge: 2021-11-01 | Disposition: A | Discharge status: Home / Self Care | Payer: Medicaid Other | Source: Ambulatory Visit | Attending: Surgery | Admitting: Surgery

## 2021-11-01 DIAGNOSIS — Z9049 Acquired absence of other specified parts of digestive tract: Secondary | ICD-10-CM | POA: Diagnosis not present

## 2021-11-01 DIAGNOSIS — K760 Fatty (change of) liver, not elsewhere classified: Secondary | ICD-10-CM | POA: Diagnosis not present

## 2021-11-01 DIAGNOSIS — K862 Cyst of pancreas: Secondary | ICD-10-CM | POA: Diagnosis not present

## 2021-11-01 DIAGNOSIS — K7689 Other specified diseases of liver: Secondary | ICD-10-CM

## 2021-11-01 MED ORDER — GADOBENATE DIMEGLUMINE 529 MG/ML IV SOLN
20.0000 mL | Freq: Once | INTRAVENOUS | Status: AC | PRN
  Administered 2021-11-01: 20 mL via INTRAVENOUS

## 2021-11-05 ENCOUNTER — Ambulatory Visit (INDEPENDENT_AMBULATORY_CARE_PROVIDER_SITE_OTHER): Payer: Medicaid Other | Admitting: Family Medicine

## 2021-11-05 ENCOUNTER — Encounter (INDEPENDENT_AMBULATORY_CARE_PROVIDER_SITE_OTHER): Payer: Self-pay | Admitting: Family Medicine

## 2021-11-05 VITALS — BP 124/73 | HR 81 | Temp 98.3°F | Ht 64.0 in | Wt 245.0 lb

## 2021-11-05 DIAGNOSIS — Z6841 Body Mass Index (BMI) 40.0 and over, adult: Secondary | ICD-10-CM

## 2021-11-05 DIAGNOSIS — E669 Obesity, unspecified: Secondary | ICD-10-CM

## 2021-11-05 DIAGNOSIS — K760 Fatty (change of) liver, not elsewhere classified: Secondary | ICD-10-CM | POA: Diagnosis not present

## 2021-11-09 NOTE — Progress Notes (Signed)
? ? ? ?Chief Complaint:  ? ?OBESITY ?Lindsey Bowen is here to discuss her progress with her obesity treatment plan along with follow-up of her obesity related diagnoses. Lindsey Bowen is on the Category 3 Plan and states she is following her eating plan approximately 30-40% of the time. Lindsey Bowen states she is not currently exercising. ? ?Today's visit was #: 3 ?Starting weight: 247 lbs ?Starting date: 09/23/2021 ?Today's weight: 245 lbs ?Today's date: 10/21/2021 ?Total lbs lost to date: 2 ?Total lbs lost since last in-office visit: 2 ? ?Interim History: Lindsey Bowen using food stamps and can't afford all of the food on the plan at times.   ? ?Subjective:  ? ?1. Type 2 diabetes mellitus with other specified complication, with long-term current use of insulin ([place]) ?Management per Endo. On insulin sliding scale with meals. Still having sugars "all over the place". She ran out of insulin for 3-4 days now.  ? ?2. Hypertension associated with type 2 diabetes mellitus ([place]) ?PCP recently changed her blood pressure medications. Started at 20 mg Valsartan 1 week ago. Blood pressure still up, with no headache.  ? ?3. Vitamin D deficiency ?[name] is tolerating medication(s) well without side effects. Medication compliance is good as patient endorses taking it as prescribed. Less tired. The patient denies additional concerns regarding this condition.     ? ?Assessment/Plan:  ?No orders of the defined types were placed in this encounter. ? ? ?Medications Discontinued During This Encounter  ?Medication Reason  ? ACCU-CHEK GUIDE test strip   ? mupirocin ointment (BACTROBAN) 2 %   ? Vitamin D, Ergocalciferol, (DRISDOL) 1.25 MG (50000 UNIT) CAPS capsule Reorder  ?  ? ?Meds ordered this encounter  ?Medications  ? Vitamin D, Ergocalciferol, (DRISDOL) 1.25 MG (50000 UNIT) CAPS capsule  ?  Sig: Take 1 capsule (50,000 Units total) by mouth every 7 (seven) days.  ?  Dispense:  4 capsule  ?  Refill:  0  ?  ? ?1. Type 2 diabetes mellitus with other  specified complication, with long-term current use of insulin ([place]) ?Contact Endo regarding medication management of her blood sugars.  ? ?2. Hypertension associated with type 2 diabetes mellitus ([place]) ?Advised patient to increase to 1 tablet of Diovan daily and after 1 week and follow up with PCP in May as planned.  ? ?3. Vitamin D deficiency ?We will refill prescription Vitamin D 50,000 IU every week for 1 month. [name] will follow-up for routine testing of Vitamin D, at least 2-3 times per year to avoid over-replacement. ? ?- Vitamin D, Ergocalciferol, (DRISDOL) 1.25 MG (50000 UNIT) CAPS capsule; Take 1 capsule (50,000 Units total) by mouth every 7 (seven) days.  Dispense: 4 capsule; Refill: 0 ? ?4. Obesity, Current BMI 42.1 ?Lindsey Bowen is currently in the action stage of change. As such, her goal is to continue with weight loss efforts. She has agreed to the Category 3 Plan.  ? ?We discussed cheaper ways to buy her food and where. She denies the need for food pantries.  ? ?Exercise goals: As is.  ? ?Behavioral modification strategies: decreasing simple carbohydrates, better snacking choices, and avoiding temptations. ? ?Lindsey Bowen has agreed to follow-up with our clinic in 2 to 3 weeks. She was informed of the importance of frequent follow-up visits to maximize her success with intensive lifestyle modifications for her multiple health conditions.  ? ?Objective:  ? ?Blood pressure (!) 152/90, pulse 81, temperature 97.9 ?F (36.6 ?C), height 5\' 4"  (1.626 m), weight 245 lb (111.1 kg), SpO2 95 %. ?  Body mass index is 42.05 kg/m?. ? ?General: Cooperative, alert, well developed, in no acute distress. ?HEENT: Conjunctivae and lids unremarkable. ?Cardiovascular: Regular rhythm.  ?Lungs: Normal work of breathing. ?Neurologic: No focal deficits.  ? ?Lab Results  ?Component Value Date  ? CREATININE 0.89 09/23/2021  ? BUN 10 09/23/2021  ? NA 145 (H) 09/23/2021  ? K 3.1 (L) 09/23/2021  ? CL 101 09/23/2021  ? CO2 30 (H) 09/23/2021   ? ?Lab Results  ?Component Value Date  ? ALT 21 09/23/2021  ? AST 19 09/23/2021  ? ALKPHOS 70 09/23/2021  ? BILITOT 0.4 09/23/2021  ? ?Lab Results  ?Component Value Date  ? HGBA1C 8.8 (H) 09/23/2021  ? HGBA1C 9.8 (A) 08/13/2021  ? HGBA1C 9.9 (H) 05/27/2017  ? ?No results found for: INSULIN ?Lab Results  ?Component Value Date  ? TSH 4.200 09/23/2021  ? ?Lab Results  ?Component Value Date  ? CHOL 173 09/23/2021  ? HDL 47 09/23/2021  ? LDLCALC 109 (H) 09/23/2021  ? TRIG 95 09/23/2021  ? CHOLHDL 5.0 (H) 10/20/2020  ? ?Lab Results  ?Component Value Date  ? VD25OH 13.8 (L) 09/23/2021  ? VD25OH 19.1 (L) 03/28/2020  ? ?Lab Results  ?Component Value Date  ? WBC 6.8 09/23/2021  ? HGB 12.3 09/23/2021  ? HCT 39.1 09/23/2021  ? MCV 72 (L) 09/23/2021  ? PLT 305 09/23/2021  ? ?Lab Results  ?Component Value Date  ? IRON 52 01/29/2021  ? FERRITIN 59.7 01/29/2021  ? FERRITIN 59.7 01/29/2021  ? ?Attestation Statements:  ? ?Reviewed by clinician on day of visit: allergies, medications, problem list, medical history, surgical history, family history, social history, and previous encounter notes. ? ? ?I, Trixie Dredge, am acting as transcriptionist for [employer], DO. ? ?I have reviewed the above documentation for accuracy and completeness, and I agree with the above. Marjory Sneddon, D.O. ? ?The [place] was signed into law in 2016 which includes the topic of electronic health records.  This provides immediate access to information in MyChart.  This includes consultation notes, operative notes, office notes, lab results and pathology reports.  If you have any questions about what you read please let us know at your next visit so we can discuss your concerns and take corrective action if need be.  We are right here with you. ? ? ?

## 2021-11-10 ENCOUNTER — Other Ambulatory Visit: Payer: Self-pay | Admitting: Nurse Practitioner

## 2021-11-10 DIAGNOSIS — I10 Essential (primary) hypertension: Secondary | ICD-10-CM

## 2021-11-11 ENCOUNTER — Ambulatory Visit: Payer: Medicaid Other | Attending: Nurse Practitioner | Admitting: Nurse Practitioner

## 2021-11-11 ENCOUNTER — Encounter: Payer: Self-pay | Admitting: Nurse Practitioner

## 2021-11-11 VITALS — BP 152/91 | HR 77 | Temp 98.3°F | Resp 16 | Wt 249.0 lb

## 2021-11-11 DIAGNOSIS — E785 Hyperlipidemia, unspecified: Secondary | ICD-10-CM | POA: Diagnosis not present

## 2021-11-11 DIAGNOSIS — G4733 Obstructive sleep apnea (adult) (pediatric): Secondary | ICD-10-CM

## 2021-11-11 DIAGNOSIS — Z794 Long term (current) use of insulin: Secondary | ICD-10-CM

## 2021-11-11 DIAGNOSIS — K5901 Slow transit constipation: Secondary | ICD-10-CM

## 2021-11-11 DIAGNOSIS — I10 Essential (primary) hypertension: Secondary | ICD-10-CM

## 2021-11-11 DIAGNOSIS — R809 Proteinuria, unspecified: Secondary | ICD-10-CM | POA: Diagnosis not present

## 2021-11-11 DIAGNOSIS — E559 Vitamin D deficiency, unspecified: Secondary | ICD-10-CM | POA: Diagnosis not present

## 2021-11-11 DIAGNOSIS — E1129 Type 2 diabetes mellitus with other diabetic kidney complication: Secondary | ICD-10-CM

## 2021-11-11 MED ORDER — BLOOD PRESSURE MONITOR DEVI: 0 refills | Status: DC

## 2021-11-11 MED ORDER — VITAMIN D (ERGOCALCIFEROL) 1.25 MG (50000 UNIT) PO CAPS: 50000.0000 [IU] | ORAL_CAPSULE | ORAL | 0 refills | Status: DC

## 2021-11-11 MED ORDER — ROSUVASTATIN CALCIUM 20 MG PO TABS: 20.0000 mg | ORAL_TABLET | Freq: Every day | ORAL | 1 refills | Status: DC

## 2021-11-11 MED ORDER — SENNA-DOCUSATE SODIUM 8.6-50 MG PO TABS: 2.0000 | ORAL_TABLET | Freq: Every day | ORAL | 6 refills | Status: DC

## 2021-11-11 MED ORDER — VALSARTAN 40 MG PO TABS: 40.0000 mg | ORAL_TABLET | Freq: Every day | ORAL | 1 refills | Status: DC

## 2021-11-11 NOTE — Progress Notes (Addendum)
Assessment & Plan:  Lindsey Bowen was seen today for bloated, establish care and hypertension.  Diagnoses and all orders for this visit:  Primary hypertension Virtual visit in a few weeks for BP check with new monitor If continues elevated based on home readings will increase valsartan  -     Blood Pressure Monitor DEVI; Please provide patient with insurance approved blood pressure monitor ICD I 10.0 -     valsartan (DIOVAN) 40 MG tablet; Take 1 tablet (40 mg total) by mouth daily. -     CMP14+EGFR  Type 2 diabetes mellitus with microalbuminuria, with long-term current use of insulin ([place]) -     Ambulatory referral to Ophthalmology  Vitamin D deficiency -     Vitamin D, Ergocalciferol, (DRISDOL) 1.25 MG (50000 UNIT) CAPS capsule; Take 1 capsule (50,000 Units total) by mouth every 7 (seven) days.  Slow transit constipation Follow up in a few weeks for bloating and relief of constipation -     sennosides-docusate sodium (SENOKOT-S) 8.6-50 MG tablet; Take 2 tablets by mouth daily.  Hyperlipidemia LDL goal <70 -     rosuvastatin (CRESTOR) 20 MG tablet; Take 1 tablet (20 mg total) by mouth at bedtime.    Patient has been counseled on age-appropriate routine health concerns for screening and prevention. These are reviewed and up-to-date. Referrals have been placed accordingly. Immunizations are up-to-date or declined.    Subjective:   Chief Complaint  Patient presents with   Bloated   Establish Care   Hypertension   HPI  Lindsey Bowen 51 y.o. female presents to office today for HTN and with complaints of abdominal bloating.  HTN Blood pressure is elevated today. Her blood pressure device is no longer working. I did verify this by checking it today in the office. She has not been able to monitor her blood pressure at home. Now taking valsartan 40 mg daily.  BP Readings from Last 3 Encounters:  11/11/21 (!) 152/91  11/05/21 124/73  10/26/21 136/67     She is requesting to  start ozempic today. I have instructed her to reach out to Dr. Hartford Poli via [facility] Ping as she does not want to wait until her next appt to start this medication. LDL not at goal with rosuvastatin 20 mg daily.  Lab Results  Component Value Date   HGBA1C 8.8 (H) 09/23/2021   Lab Results  Component Value Date   LDLCALC 109 (H) 09/23/2021     Abdominal bloating She does not have daily bowel movements. She notes ongoing abdominal discomfort, early satiety, bloating and gas for several years now. Bloating is present without regard to eating or not eating. In the past she has taken stool softeners and cinnamon which was effective in relieving constipation but not her abdominal discomfort. There has been no significant weight loss despite her endorsement of decreased appetite. She does not endorse chronic upper GI symptoms such as heartburn, acid regurgitation, nausea/vomiting or dysphagia. She saw GI last year 01-29-2021: Colonoscopy normal(repeat 10 years) Hpylori negative Endoscopy: normal MRI ABDOMEN Severe hepatic steatosis with areas of focal fatty sparing, corresponding with findings on prior CT. No suspicious hepatic lesion. 2. Resolution of the tiny cystic focus in the pancreatic head. No pancreatic ductal dilation.  OSA She endorses excessive daytime sleepiness. States was diagnosed with "mild" OSA years ago but did not require a CPAP at that time.  She had another sleep study performed 04-2019 which showed mild OSA however she did not follow up for the CPAP titration  study or autopap.   She does snore and endorses shortness of breath with minimal exertion.          Review of Systems  Constitutional:  Negative for fever, malaise/fatigue and weight loss.  HENT: Negative.  Negative for nosebleeds.   Eyes: Negative.  Negative for blurred vision, double vision and photophobia.  Respiratory: Negative.  Negative for cough and shortness of breath.   Cardiovascular: Negative.  Negative for  chest pain, palpitations and leg swelling.  Gastrointestinal:  Positive for abdominal pain. Negative for blood in stool, constipation, diarrhea, heartburn, melena, nausea and vomiting.       SEE HPI  Musculoskeletal: Negative.  Negative for myalgias.  Neurological: Negative.  Negative for dizziness, focal weakness, seizures and headaches.  Psychiatric/Behavioral: Negative.  Negative for suicidal ideas.    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/2017   Diabetes mellitus without complication ([place])    Gallstones    Hypothyroidism    Joint pain    Lumbar pain    Migraine    Onychodystrophy    Onychomycosis    Other fatigue    Paresthesia 05/17/2017   Shortness of breath on exertion    Sickle cell trait ([place])    Sleep apnea    TIA (transient ischemic attack) 05/26/2017   Type 2 diabetes mellitus with microalbuminuria ([place]) 05/27/2017    Past Surgical History:  Procedure Laterality Date   CHOLECYSTECTOMY     DILATION AND CURETTAGE OF UTERUS     HERNIA REPAIR      Family History  Problem Relation Age of Onset   Diabetes Mother    Hypertension Mother    Addison's disease Mother    Asthma Mother    Colon polyps Mother    Irritable bowel syndrome Mother    Glaucoma Father    Mental retardation Sister    Glaucoma Sister    Heart attack Maternal Aunt    Heart attack Maternal Uncle    Diabetes Paternal Uncle    Heart attack Maternal Grandmother    Stroke Maternal Grandmother    Ulcerative colitis Maternal Grandfather    Diabetes Maternal Grandfather    Stomach cancer Maternal Grandfather    Diabetes Paternal Grandmother    Irritable bowel syndrome Daughter    Diabetes Son    Colon cancer Neg Hx    Esophageal cancer Neg Hx    Rectal cancer Neg Hx     Social History  Reviewed with no changes to be made today.   Outpatient Medications Prior to Visit  Medication Sig Dispense Refill   acetaminophen (TYLENOL) 650 MG CR tablet Take 650 mg by mouth every 8 (eight) hours as needed for pain.     aspirin-acetaminophen-caffeine (EXCEDRIN MIGRAINE) 250-250-65 MG tablet Take 2 tablets by mouth every 6 (six) hours as needed for headache.     BD PEN NEEDLE NANO 2ND GEN 32G X 4 MM MISC ONE EACH BY OTHER ROUTE DAILY.     Continuous Blood Gluc Sensor (DEXCOM G6 SENSOR) MISC 1 each by Other route See admin instructions. Use one sensor once every 10 days to monitor blood sugars.     Continuous Blood Gluc Transmit (DEXCOM G6 TRANSMITTER) MISC 1 each by Other route See  admin instructions. Use one transmitter once every 90 days to transmit blood sugars.     EPINEPHrine 0.3 mg/0.3 mL IJ SOAJ injection Inject 0.3 mg into the muscle as needed for anaphylaxis. 1 each 1   fluticasone (FLONASE) 50 MCG/ACT nasal spray Place 2 sprays into both nostrils daily as needed for allergies or rhinitis.     ibuprofen (ADVIL) 200 MG tablet Take 400 mg by mouth every 6 (six) hours as needed.     insulin regular human CONCENTRATED (HUMULIN R U-500 KWIKPEN) 500 UNIT/ML kwikpen Inject into the skin 3 (three) times daily with meals. Inject 85 units under the skin before breakfast and 60 units before lunch and dinner.     levocetirizine (XYZAL) 5 MG tablet Take 5 mg by mouth daily as needed.     levonorgestrel (LILETTA, 52 MG,) 20.1 MCG/DAY IUD 1 each by Intrauterine route once.     levothyroxine (SYNTHROID) 175 MCG tablet Take 175 mcg by mouth daily before breakfast. Take 1 tablet by mouth 6 days per week.     methocarbamol (ROBAXIN) 500 MG tablet Take 500 mg by mouth daily as needed for muscle spasms.     PROAIR HFA 108 (90 Base) MCG/ACT inhaler INHALE 1-2 PUFFS BY MOUTH EVERY 6 HOURS AS NEEDED FOR WHEEZE OR SHORTNESS OF BREATH 18 g 1   rosuvastatin (CRESTOR) 20 MG tablet Take 20 mg by mouth at bedtime.      valsartan (DIOVAN) 40 MG tablet Take 0.5 tablets (20 mg total) by mouth daily. Take half a tablet daily. DO NOT TAKE A WHOLE TABLET 15 tablet 1   Vitamin D, Ergocalciferol, (DRISDOL) 1.25 MG (50000 UNIT) CAPS capsule Take 1 capsule (50,000 Units total) by mouth every 7 (seven) days. 4 capsule 0   Blood Pressure Monitor DEVI Please provide patient with insurance approved blood pressure monitor ICD I 10.0 1 each 0   No facility-administered medications prior to visit.    Allergies  Allergen Reactions   Dust Mite Extract Hives and Itching   Shellfish Allergy Anaphylaxis       Objective:    BP (!) 152/91   Pulse 77   Temp 98.3 F (36.8 C) (Oral)   Resp 16   Wt 249 lb (112.9 kg)   LMP 10/12/2021 (Approximate)   SpO2 95%   BMI 42.74 kg/m  Wt Readings from Last 3 Encounters:  11/11/21 249 lb (112.9 kg)  11/05/21 245 lb (111.1 kg)  10/26/21 248 lb 14.4 oz (112.9 kg)    Physical Exam Vitals and nursing note reviewed.  Constitutional:      Appearance: She is well-developed.  HENT:     Head: Normocephalic and atraumatic.  Cardiovascular:     Rate and Rhythm: Normal rate and regular rhythm.     Heart sounds: Normal heart sounds. No murmur heard.   No friction rub. No gallop.  Pulmonary:     Effort: Pulmonary effort is normal. No tachypnea or respiratory distress.     Breath sounds: Normal breath sounds. No decreased breath sounds, wheezing, rhonchi or rales.  Chest:     Chest wall: No tenderness.  Abdominal:     General: Bowel sounds are normal.     Palpations: Abdomen is soft.  Musculoskeletal:        General: Normal range of motion.     Cervical back: Normal range of motion.  Skin:    General: Skin is warm and dry.  Neurological:     Mental Status: She is alert  and oriented to person, place, and time.     Coordination: Coordination normal.  Psychiatric:        Behavior: Behavior normal. Behavior is cooperative.        Thought Content: Thought content normal.         Judgment: Judgment normal.         Patient has been counseled extensively about nutrition and exercise as well as the importance of adherence with medications and regular follow-up. The patient was given clear instructions to go to ER or return to medical center if symptoms don't improve, worsen or new problems develop. The patient verbalized understanding.   Follow-up: Return in about 3 weeks (around 12/02/2021) for TUESDAY virtual visit: abdominal bloating /bp check.   Gildardo Pounds, FNP-BC [facility], [place]   11/11/2021, 10:36 PM

## 2021-11-11 NOTE — Progress Notes (Signed)
Bloating and stomach concerns ? ?CBG 123 ?

## 2021-11-12 ENCOUNTER — Encounter (INDEPENDENT_AMBULATORY_CARE_PROVIDER_SITE_OTHER): Payer: Self-pay | Admitting: Family Medicine

## 2021-11-12 LAB — CMP14+EGFR
ALT: 31 IU/L (ref 0–32)
AST: 21 IU/L (ref 0–40)
Albumin/Globulin Ratio: 1.8 (ref 1.2–2.2)
Albumin: 4.4 g/dL (ref 3.8–4.8)
Alkaline Phosphatase: 78 IU/L (ref 44–121)
BUN/Creatinine Ratio: 10 (ref 9–23)
BUN: 9 mg/dL (ref 6–24)
Bilirubin Total: 0.3 mg/dL (ref 0.0–1.2)
CO2: 27 mmol/L (ref 20–29)
Calcium: 9.8 mg/dL (ref 8.7–10.2)
Chloride: 102 mmol/L (ref 96–106)
Creatinine, Ser: 0.9 mg/dL (ref 0.57–1.00)
Globulin, Total: 2.4 g/dL (ref 1.5–4.5)
Glucose: 90 mg/dL (ref 70–99)
Potassium: 3.7 mmol/L (ref 3.5–5.2)
Sodium: 142 mmol/L (ref 134–144)
Total Protein: 6.8 g/dL (ref 6.0–8.5)
eGFR: 78 mL/min/{1.73_m2} (ref >59)

## 2021-11-12 NOTE — Telephone Encounter (Signed)
Dr.Opalski ?

## 2021-11-12 NOTE — Telephone Encounter (Signed)
She Dr. Dalbert Garnet at last OV

## 2021-11-14 ENCOUNTER — Encounter: Payer: Self-pay | Admitting: Nurse Practitioner

## 2021-11-16 NOTE — Addendum Note (Signed)
Addended by: [name] on: 11/16/2021 08:32 AM   Modules accepted: Orders

## 2021-11-18 NOTE — Progress Notes (Addendum)
TeleHealth Visit:  This visit was completed with telemedicine (audio/video) technology. Lindsey Bowen has verbally consented to this TeleHealth visit. The patient is located at home, the provider is located at home. The participants in this visit include the listed provider and patient. The visit was conducted today via MyChart video.  OBESITY Lindsey Bowen is here to discuss her progress with her obesity treatment plan along with follow-up of her obesity related diagnoses.   Today's visit was # 4 Starting weight: 247 lbs Starting date: 09/23/2021 Weight at last in office visit: 245 lbs on 11/05/21 Total weight loss: 2 lbs at last in office visit on 11/05/21. Today's reported weight: No weight reported.  Nutrition Plan: keeping a food journal and adhering to recommended goals of 1400-1600 calories and 100+gms protein. 80 % adherence. Hunger is well controlled. Cravings are well controlled.  Current exercise: walks on day off. Averages 3000 steps on work days.  Interim History: Lindsey Bowen is journaling consistently.  She is keeping calories at goal but did not know how to check her protein in My Fitness Pal.  I explained this to her. She feels she is doing a good job with protein intake however. Has orange juice when her sugars are low. She feels sometimes her portions are too large.   She has now tried the fair life milk and says it is okay.   Assessment/Plan:  1. Type II Diabetes HgbA1c is not at goal.  Managed by Dr. Darel Hong endocrinologist.  Both parents have diabetes. CBGs: Fasting 150-170s. 2 hour PP 180-200. Has Dexcom. Episodes of hypoglycemia? yes - 50s - more frequently since starting the meal plan. Medication(s): Humulin 3 x daily 70-50-50, dosages recently reduced by Dr. Shawnee Knapp.  Dr. Shawnee Knapp thought that we were going to start Ozempic.  Lab Results  Component Value Date   HGBA1C 8.8 (H) 09/23/2021   HGBA1C 9.8 (A) 08/13/2021   HGBA1C 9.9 (H) 05/27/2017   Lab Results   Component Value Date   LDLCALC 109 (H) 09/23/2021   CREATININE 0.90 11/11/2021    Plan: Continue Humulin at doses listed above. Contact Dr. Shawnee Knapp to make him aware of hypoglycemia and to have him start Ozempic.   2.  Hypertension associated with type 2 diabetes Hypertension needs improvement.  Needs new blood pressure cuff to monitor pressures at home. Medication(s): Valsartan 40 mg daily. Denies chest pain and SOB.  BP Readings from Last 3 Encounters:  11/11/21 (!) 152/91  11/05/21 124/73  10/26/21 136/67   Lab Results  Component Value Date   CREATININE 0.90 11/11/2021   CREATININE 0.89 09/23/2021   CREATININE 1.06 (H) 10/20/2020    Plan: Continue valsartan at current dose.   Obtain new blood pressure cuff and check 3-4 times per week and log. Take log to PCP visit next month.   3. Obesity: Current BMI 42.03 Lindsey Bowen is currently in the action stage of change. As such, her goal is to continue with weight loss efforts.  She has agreed to keeping a food journal and adhering to recommended goals of 1400-1600 calories and 100+gms protein.   Told her about the Lose It! App which has a free barcode scanner.  Exercise goals: as is.  Behavioral modification strategies: increasing lean protein intake, decreasing simple carbohydrates, and keeping a strict food journal.  Lazaya has agreed to follow-up with our clinic in 2 weeks.   No orders of the defined types were placed in this encounter.   There are no discontinued medications.   No orders of the  defined types were placed in this encounter.     Objective:   VITALS: Per patient if applicable, see vitals. GENERAL: Alert and in no acute distress. CARDIOPULMONARY: No increased WOB. Speaking in clear sentences.  PSYCH: Pleasant and cooperative. Speech normal rate and rhythm. Affect is appropriate. Insight and judgement are appropriate. Attention is focused, linear, and appropriate.  NEURO: Oriented as arrived to  appointment on time with no prompting.   Lab Results  Component Value Date   CREATININE 0.90 11/11/2021   BUN 9 11/11/2021   NA 142 11/11/2021   K 3.7 11/11/2021   CL 102 11/11/2021   CO2 27 11/11/2021   Lab Results  Component Value Date   ALT 31 11/11/2021   AST 21 11/11/2021   ALKPHOS 78 11/11/2021   BILITOT 0.3 11/11/2021   Lab Results  Component Value Date   HGBA1C 8.8 (H) 09/23/2021   HGBA1C 9.8 (A) 08/13/2021   HGBA1C 9.9 (H) 05/27/2017   No results found for: INSULIN Lab Results  Component Value Date   TSH 4.200 09/23/2021   Lab Results  Component Value Date   CHOL 173 09/23/2021   HDL 47 09/23/2021   LDLCALC 109 (H) 09/23/2021   TRIG 95 09/23/2021   CHOLHDL 5.0 (H) 10/20/2020   Lab Results  Component Value Date   WBC 6.8 09/23/2021   HGB 12.3 09/23/2021   HCT 39.1 09/23/2021   MCV 72 (L) 09/23/2021   PLT 305 09/23/2021   Lab Results  Component Value Date   IRON 52 01/29/2021   FERRITIN 59.7 01/29/2021   FERRITIN 59.7 01/29/2021   Lab Results  Component Value Date   VD25OH 13.8 (L) 09/23/2021   VD25OH 19.1 (L) 03/28/2020    Attestation Statements:   Reviewed by clinician on day of visit: allergies, medications, problem list, medical history, surgical history, family history, social history, and previous encounter notes.  Time spent on visit including pre-visit chart review and post-visit charting and care was 35 minutes.

## 2021-11-19 ENCOUNTER — Telehealth (INDEPENDENT_AMBULATORY_CARE_PROVIDER_SITE_OTHER): Payer: Medicaid Other | Admitting: Family Medicine

## 2021-11-19 ENCOUNTER — Encounter (INDEPENDENT_AMBULATORY_CARE_PROVIDER_SITE_OTHER): Payer: Self-pay | Admitting: Family Medicine

## 2021-11-19 DIAGNOSIS — I152 Hypertension secondary to endocrine disorders: Secondary | ICD-10-CM | POA: Diagnosis not present

## 2021-11-19 DIAGNOSIS — E1159 Type 2 diabetes mellitus with other circulatory complications: Secondary | ICD-10-CM | POA: Diagnosis not present

## 2021-11-19 DIAGNOSIS — E669 Obesity, unspecified: Secondary | ICD-10-CM

## 2021-11-19 DIAGNOSIS — Z794 Long term (current) use of insulin: Secondary | ICD-10-CM | POA: Diagnosis not present

## 2021-11-19 DIAGNOSIS — E1129 Type 2 diabetes mellitus with other diabetic kidney complication: Secondary | ICD-10-CM

## 2021-11-19 DIAGNOSIS — Z6841 Body Mass Index (BMI) 40.0 and over, adult: Secondary | ICD-10-CM | POA: Diagnosis not present

## 2021-11-19 NOTE — Progress Notes (Signed)
Chief Complaint:   OBESITY Lindsey Bowen is here to discuss her progress with her obesity treatment plan along with follow-up of her obesity related diagnoses. Lindsey Bowen is on the Category 3 Plan and states she is following her eating plan approximately 30% of the time. Lindsey Bowen states she is walking for 30 minutes 1 time per week.  Today's visit was #: 4 Starting weight: 247 lbs Starting date: 09/23/2021 Today's weight: 245 lbs Today's date: 11/05/2021 Total lbs lost to date: 2 Total lbs lost since last in-office visit: 0  Interim History: Lindsey Bowen is working on weight loss but she is struggling to meal plan. She has trouble eating all of her food at times, and her RMR has likely decreased.   Subjective:   1. Hepatic steatosis Lindsey Bowen had a MRI done and she has not heard back. She read the report herself and she is worried about the results.   Assessment/Plan:   1. Hepatic steatosis We discussed her MRI report and the diagnoses of non-alcoholic fatty liver disease. Weight loss is critical for the treatment. Lindsey Bowen is to follow up with the ordering physician to review test results otherwise.   2. Obesity, Current BMI 42.2 Lindsey Bowen is currently in the action stage of change. As such, her goal is to continue with weight loss efforts. She has agreed to change to keeping a food journal and adhering to recommended goals of 1400-1600 calories and 100+ grams of protein daily.   Exercise goals: As is.  Behavioral modification strategies: increasing lean protein intake and keeping a strict food journal.  Lindsey Bowen has agreed to follow-up with our clinic in 2 weeks. She was informed of the importance of frequent follow-up visits to maximize her success with intensive lifestyle modifications for her multiple health conditions.   Objective:   Blood pressure 124/73, pulse 81, temperature 98.3 F (36.8 C), height 5\' 4"  (1.626 m), weight 245 lb (111.1 kg), last menstrual period 10/12/2021, SpO2 98  %. Body mass index is 42.05 kg/m.  General: Cooperative, alert, well developed, in no acute distress. HEENT: Conjunctivae and lids unremarkable. Cardiovascular: Regular rhythm.  Lungs: Normal work of breathing. Neurologic: No focal deficits.   Lab Results  Component Value Date   CREATININE 0.90 11/11/2021   BUN 9 11/11/2021   NA 142 11/11/2021   K 3.7 11/11/2021   CL 102 11/11/2021   CO2 27 11/11/2021   Lab Results  Component Value Date   ALT 31 11/11/2021   AST 21 11/11/2021   ALKPHOS 78 11/11/2021   BILITOT 0.3 11/11/2021   Lab Results  Component Value Date   HGBA1C 8.8 (H) 09/23/2021   HGBA1C 9.8 (A) 08/13/2021   HGBA1C 9.9 (H) 05/27/2017   No results found for: INSULIN Lab Results  Component Value Date   TSH 4.200 09/23/2021   Lab Results  Component Value Date   CHOL 173 09/23/2021   HDL 47 09/23/2021   LDLCALC 109 (H) 09/23/2021   TRIG 95 09/23/2021   CHOLHDL 5.0 (H) 10/20/2020   Lab Results  Component Value Date   VD25OH 13.8 (L) 09/23/2021   VD25OH 19.1 (L) 03/28/2020   Lab Results  Component Value Date   WBC 6.8 09/23/2021   HGB 12.3 09/23/2021   HCT 39.1 09/23/2021   MCV 72 (L) 09/23/2021   PLT 305 09/23/2021   Lab Results  Component Value Date   IRON 52 01/29/2021   FERRITIN 59.7 01/29/2021   FERRITIN 59.7 01/29/2021   Attestation Statements:  Reviewed by clinician on day of visit: allergies, medications, problem list, medical history, surgical history, family history, social history, and previous encounter notes.  Time spent on visit including pre-visit chart review and post-visit care and charting was 35 minutes.    I, Trixie Dredge, am acting as transcriptionist for Dennard Nip, MD.  I have reviewed the above documentation for accuracy and completeness, and I agree with the above. -  Dennard Nip, MD

## 2021-11-30 ENCOUNTER — Encounter: Payer: Self-pay | Admitting: Nurse Practitioner

## 2021-12-01 ENCOUNTER — Encounter: Payer: Self-pay | Admitting: Nurse Practitioner

## 2021-12-01 ENCOUNTER — Ambulatory Visit: Payer: Medicaid Other | Admitting: Nurse Practitioner

## 2021-12-01 ENCOUNTER — Ambulatory Visit: Payer: Medicaid Other | Attending: Nurse Practitioner | Admitting: Nurse Practitioner

## 2021-12-01 ENCOUNTER — Telehealth: Payer: Self-pay | Admitting: Nurse Practitioner

## 2021-12-01 DIAGNOSIS — I10 Essential (primary) hypertension: Secondary | ICD-10-CM | POA: Diagnosis not present

## 2021-12-01 MED ORDER — VALSARTAN 40 MG PO TABS: 60.0000 mg | ORAL_TABLET | Freq: Every day | ORAL | 0 refills | Status: DC

## 2021-12-01 NOTE — Progress Notes (Signed)
Virtual Visit via Telephone Note  I discussed the limitations, risks, security and privacy concerns of performing an evaluation and management service by telephone and the availability of in person appointments. I also discussed with the patient that there may be a patient responsible charge related to this service. The patient expressed understanding and agreed to proceed.    I connected with Lindsey Bowen on 12/01/21  at   3:10 PM EDT  EDT by telephone and verified that I am speaking with the correct person using two identifiers.  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    HTN We increased her valsartan from 20 mg to 40 mg on 11-11-2021 due to poorly controlled HTN. Today she states her blood pressure readings at home are still elevated with averages 130/90s. At this time we will increase valsartan to 60 mg daily.  BP Readings from Last 3 Encounters:  11/11/21 (!) 152/91  11/05/21 124/73  10/26/21 136/67       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   Diabetes mellitus without complication (HCC)    Gallstones    Hypothyroidism    Joint pain    Lumbar pain    Migraine     Onychodystrophy    Onychomycosis    Other fatigue    Paresthesia 05/17/2017   Shortness of breath on exertion    Sickle cell trait (HCC)    Sleep apnea    TIA (transient ischemic attack) 05/26/2017   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    Past Surgical History:  Procedure Laterality Date   CHOLECYSTECTOMY     DILATION AND CURETTAGE OF UTERUS     HERNIA REPAIR      Family History  Problem Relation Age of Onset   Diabetes Mother    Hypertension Mother    Addison's disease Mother    Asthma Mother    Colon polyps Mother    Irritable bowel syndrome Mother    Glaucoma Father    Mental retardation Sister    Glaucoma Sister    Heart attack Maternal Aunt    Heart attack Maternal Uncle    Diabetes Paternal Uncle    Heart attack Maternal Grandmother    Stroke Maternal Grandmother    Ulcerative colitis Maternal Grandfather    Diabetes Maternal Grandfather    Stomach cancer Maternal Grandfather    Diabetes Paternal Grandmother    Irritable bowel syndrome Daughter    Diabetes Son    Colon cancer Neg Hx    Esophageal cancer Neg Hx    Rectal cancer Neg Hx     Social History   Socioeconomic History   Marital status: Single    Spouse name: Not on file   Number of children: 3  Years of education: Not on file   Highest education level: Not on file  Occupational History   Occupation: Medical Courrier    Employer: [employer]    Comment: Works nights  Tobacco Use   Smoking status: Never   Smokeless tobacco: Never  Vaping Use   Vaping Use: Never used  Substance and Sexual Activity   Alcohol use: No   Drug use: Not Currently   Sexual activity: Yes    Partners: Male    Birth control/protection: I.U.D.    Comment: Liletta  Other Topics Concern   Not on file  Social History Narrative   Lives    Caffeine use:    Social Determinants of Health   Financial Resource Strain: Medium Risk   Difficulty of Paying Living Expenses: Somewhat hard   Food Insecurity: Not on file  Transportation Needs: Not on file  Physical Activity: Not on file  Stress: Not on file  Social Connections: Not on file     Observations/Objective: Awake, alert and oriented x 3   Review of Systems  Constitutional:  Negative for fever, malaise/fatigue and weight loss.  HENT: Negative.  Negative for nosebleeds.   Eyes: Negative.  Negative for blurred vision, double vision and photophobia.  Respiratory: Negative.  Negative for cough and shortness of breath.   Cardiovascular: Negative.  Negative for chest pain, palpitations and leg swelling.  Gastrointestinal: Negative.  Negative for heartburn, nausea and vomiting.  Musculoskeletal: Negative.  Negative for myalgias.  Neurological: Negative.  Negative for dizziness, focal weakness, seizures and headaches.  Psychiatric/Behavioral: Negative.  Negative for suicidal ideas.    Assessment and Plan: Diagnoses and all orders for this visit:  Primary hypertension -     valsartan (DIOVAN) 40 MG tablet; Take 1.5 tablets (60 mg total) by mouth daily. She will log her BP readings for the next 2 weeks and send them via mychart for review. If still elevated we will increase valsartan to 80mg  at that time.   Follow Up Instructions Return in about 3 months (around 03/03/2022).     I discussed the assessment and treatment plan with the patient. The patient was provided an opportunity to ask questions and all were answered. The patient agreed with the plan and demonstrated an understanding of the instructions.   The patient was advised to call back or seek an in-person evaluation if the symptoms worsen or if the condition fails to improve as anticipated.  I provided 11 minutes of non-face-to-face time during this encounter including median intraservice time, reviewing previous notes, labs, imaging, medications and explaining diagnosis and management.  05/03/2022, FNP-BC

## 2021-12-01 NOTE — Telephone Encounter (Signed)
Sent link to connect to virtual visit. Patient never signed on after waiting 10 minutes.   Called patient home number and LVM regarding virtual visit and to return call to office.

## 2021-12-01 NOTE — Progress Notes (Signed)
No answer. LVM and also waited on video call for 

## 2021-12-03 ENCOUNTER — Ambulatory Visit (INDEPENDENT_AMBULATORY_CARE_PROVIDER_SITE_OTHER): Payer: Medicaid Other | Admitting: Nurse Practitioner

## 2021-12-07 ENCOUNTER — Other Ambulatory Visit: Payer: Self-pay | Admitting: Pharmacist

## 2021-12-07 VITALS — BP 132/87

## 2021-12-07 DIAGNOSIS — I152 Hypertension secondary to endocrine disorders: Secondary | ICD-10-CM

## 2021-12-07 DIAGNOSIS — E1159 Type 2 diabetes mellitus with other circulatory complications: Secondary | ICD-10-CM

## 2021-12-07 NOTE — Patient Instructions (Signed)
Lindsey Bowen,   It was great talking to you today!  Communicate any patterns of low blood sugar to Dr. Hartford Poli so he can advise you on how to further reduce your insulin dosing as the Ozempic takes a greater effect.   Our goal Time In Range 80-180 is greater than 70%.   Check your blood pressure once daily, and any time you have concerning symptoms like headache, chest pain, dizziness, shortness of breath, or vision changes.   Our goal is less than 130/80.  To appropriately check your blood pressure, make sure you do the following:  1) Avoid caffeine, exercise, or tobacco products for 30 minutes before checking. Empty your bladder. 2) Sit with your back supported in a flat-backed chair. Rest your arm on something flat (arm of the chair, table, etc). 3) Sit still with your feet flat on the floor, resting, for at least 5 minutes.  4) Check your blood pressure. Take 1-2 readings.  5) Write down these readings and bring with you to any provider appointments.  Bring your home blood pressure machine with you to a provider's office for accuracy comparison at least once a year.   Make sure you take your blood pressure medications before you come to any office visit, even if you were asked to fast for labs.  Take care!  Catie Hedwig Morton, PharmD, [facility] [phone]

## 2021-12-07 NOTE — Chronic Care Management (AMB) (Signed)
Chief Complaint  Patient presents with   Diabetes   Hypertension   Hyperlipidemia    Lindsey Bowen is a 51 y.o. year old female who presented for a telephone visit.   They were referred to the pharmacist by a quality report for assistance in managing diabetes and hypertension.   Patient is participating in a Managed Medicaid Plan:  Yes  Subjective:  Care Team: Primary Care Provider: Claiborne RiggFleming, Zelda W, NP ; Next Scheduled Visit: 02/12/22 Endocrinologist Shawnee KnappLevy; Next Scheduled Visit: 02/04/22  Medication Access/Adherence  Current Pharmacy:  [facility] [place] - [address] [place] [id number] Phone: [phone] Fax: [phone]   Patient reports affordability concerns with their medications: No  Patient reports access/transportation concerns to their pharmacy: No  Patient reports adherence concerns with their medications:  No     Diabetes:  Current medications: Ozempic 0.25 mg weekly, Humulin R U500 70 units with breakfast, 50 units with lunch and supper  Current glucose readings: reports post prandials after breakfast are occasionally >180. Reports some concerns with hypoglycemia if she doesn't eat. Variable meal schedule due to when she is out on the road.   Hypertension:  Current medications: valsartan 60 mg daily  Patient has a validated, automated, upper arm home BP cuff Current blood pressure readings readings: 130s/80s per patient report  Patient denies hypotensive s/sx including dizziness, lightheadedness.  Patient denies hypertensive symptoms including headache, chest pain, shortness of breath    Health Maintenance  Health Maintenance Due  Topic Date Due   COVID-19 Vaccine (1) Never done   OPHTHALMOLOGY EXAM  Never done   FOOT EXAM  12/16/2020     Objective: Lab Results  Component Value Date   HGBA1C 8.8 (H) 09/23/2021    Lab Results  Component Value Date   CREATININE 0.90  11/11/2021   BUN 9 11/11/2021   NA 142 11/11/2021   K 3.7 11/11/2021   CL 102 11/11/2021   CO2 27 11/11/2021    Lab Results  Component Value Date   CHOL 173 09/23/2021   HDL 47 09/23/2021   LDLCALC 109 (H) 09/23/2021   TRIG 95 09/23/2021   CHOLHDL 5.0 (H) 10/20/2020    Medications Reviewed Today     Reviewed by Alden HippHarper, Breezy Hertenstein T, RPH-CPP (Pharmacist) on 12/07/21 at 1512  Med List Status: <None>   Medication Order Taking? Sig Documenting Provider Last Dose Status Informant  acetaminophen (TYLENOL) 650 MG CR tablet [id number] Yes Take 650 mg by mouth every 8 (eight) hours as needed for pain. [provider] Taking Active Self  aspirin-acetaminophen-caffeine (EXCEDRIN MIGRAINE) [id number] MG tablet [id number] Yes Take 2 tablets by mouth every 6 (six) hours as needed for headache. [provider] Taking Active   BD PEN NEEDLE NANO 2ND GEN 32G X 4 MM MISC [id number] Yes ONE EACH BY OTHER ROUTE DAILY. [provider] Taking Active   Blood Pressure Monitor DEVI [id number] Yes Please provide patient with insurance approved blood pressure monitor ICD I 10.0 Claiborne RiggFleming, Zelda W, NP Taking Active   Continuous Blood Gluc Sensor (DEXCOM G6 SENSOR) MISC [id number] Yes 1 each by Other route See admin instructions. Use one sensor once every 10 days to monitor blood sugars. [provider] Taking Active   Continuous Blood Gluc Transmit (DEXCOM G6 TRANSMITTER) MISC [id number] Yes 1 each by Other route See admin instructions. Use one transmitter once every 90 days to transmit blood sugars. [provider] Taking  Active   EPINEPHrine 0.3 mg/0.3 mL IJ SOAJ injection [id number] Yes Inject 0.3 mg into the muscle as needed for anaphylaxis. Claiborne Rigg, NP Taking Active            Med Note [facility], Bary Castilla   Wed Sep 23, 2021 10:29 AM)    fluticasone Aleda Grana) 50 MCG/ACT nasal spray [id number] Yes Place 2 sprays into both nostrils daily as needed for allergies or rhinitis.  [provider] Taking Active   ibuprofen (ADVIL) 200 MG tablet [id number]  Take 400 mg by mouth every 6 (six) hours as needed. [provider]  Active   insulin regular human CONCENTRATED (HUMULIN R U-500 KWIKPEN) 500 UNIT/ML Stephanie Coup [id number] Yes Inject into the skin 3 (three) times daily with meals. Inject 85 units under the skin before breakfast and 60 units before lunch and dinner. [provider] Taking Active            Med Note Clearance Coots, Mel Almond Dec 07, 2021  3:06 PM) 70 am, 50 with lunch and supper  levocetirizine (XYZAL) 5 MG tablet [id number] Yes Take 5 mg by mouth daily as needed. [provider] Taking Active   levonorgestrel (LILETTA, 52 MG,) 20.1 MCG/DAY IUD [id number] Yes 1 each by Intrauterine route once. [provider] Taking Active   levothyroxine (SYNTHROID) 175 MCG tablet [id number] Yes Take 175 mcg by mouth daily before breakfast. Take 1 tablet by mouth 6 days per week. [provider] Taking Active   methocarbamol (ROBAXIN) 500 MG tablet [id number] Yes Take 500 mg by mouth daily as needed for muscle spasms. [provider] Taking Active   OZEMPIC, 0.25 OR 0.5 MG/DOSE, 2 MG/3ML SOPN [id number] Yes Inject 0.25 mg into the skin once a week. [provider] Taking Active   PROAIR HFA 108 [phone] Base) MCG/ACT inhaler [id number] Yes INHALE 1-2 PUFFS BY MOUTH EVERY 6 HOURS AS NEEDED FOR WHEEZE OR SHORTNESS OF Tonette Bihari, NP Taking Active   rosuvastatin (CRESTOR) 20 MG tablet [id number] Yes Take 1 tablet (20 mg total) by mouth at bedtime. Claiborne Rigg, NP Taking Active   sennosides-docusate sodium (SENOKOT-S) 8.6-50 MG tablet [id number] Yes Take 2 tablets by mouth daily. Claiborne Rigg, NP Taking Active   valsartan (DIOVAN) 40 MG tablet [id number] Yes Take 1.5 tablets (60 mg total) by mouth daily. Claiborne Rigg, NP Taking Active   Vitamin D, Ergocalciferol, (DRISDOL) 1.25 MG (50000 UNIT) CAPS  capsule [id number] Yes Take 1 capsule (50,000 Units total) by mouth every 7 (seven) days. Claiborne Rigg, NP Taking Active             SDOH Interventions    Flowsheet Row Most Recent Value  SDOH Interventions   Financial Strain Interventions Intervention Not Indicated       Assessment/Plan:    Diabetes: - Currently uncontrolled but improving - Reviewed goal A1c, goal fasting, and goal 2 hour post prandial glucose as well as time in range - Reviewed dietary modifications including importance of protein with meals - Encouraged to continue to communicate any low blood sugar patterns to Dr. Shawnee Knapp, especially before she is due to step up to 0.5 mg week.y - Reviewed lifestyle modifications including: - Recommend to continue current regimen at this time   Hypertension: - Currently uncontrolled but improving.  - Reviewed long term cardiovascular and renal outcomes of uncontrolled blood pressure - Reviewed appropriate blood pressure monitoring technique and reviewed goal blood pressure. Recommended to check  home blood pressure and heart rate daily - Recommend to continue current regimen at this time.    Health Maintenance - Discussed CDC/ACIP recommendations for COVID vaccines. Patient declines - Discussed screening recommendations for foot exam and eye exam. Reports she sees podiatry and has an eye doctor ([facility]), but needs to reschedule. Reminded to have those providers send results to Dr. Shawnee Knapp and Shon Hale Fleming's office for documentation   Follow Up Plan: phone call in 6 weeks  Catie TClearance Coots, PharmD, [facility] [phone]

## 2021-12-17 NOTE — Progress Notes (Signed)
TeleHealth Visit:  This visit was completed with telemedicine (audio/video) technology. Lindsey Bowen has verbally consented to this TeleHealth visit. The patient is located at home, the provider is located at home. The participants in this visit include the listed provider and patient. The visit was conducted today via MyChart video.  OBESITY Lindsey Bowen is here to discuss her progress with her obesity treatment plan along with follow-up of her obesity related diagnoses.   Today's visit was # 5 Starting weight: 247 lbs Starting date: 09/23/2021 Weight at last in office visit: 245 lbs on 11/05/21 Total weight loss: 2 lbs at last in office visit on 11/05/21. Today's reported weight: 244 lbs   Nutrition Plan: keeping a food journal and adhering to recommended goals of 1400-1600 calories and 100+ gms protein.  Hunger is well controlled.  Current exercise: none  Interim History: She has been caring for her handicapped sister the past few weeks and notes quite a bit of stress.  She has not not journaled recently.  She is eating regular meals-3 times per day and having protein at every meal. She would rather not switch back to the category 3 plan because of the expense of groceries.  She will eat eggs and drink milk but does not like yogurt or cheese.  Assessment/Plan:  1. Type II Diabetes HgbA1c is not at goal.  Last A1c was elevated at 8.8.  Managed by Dr. Shawnee Knapp- [facility]. CBGs: has Dexcom Episodes of hypoglycemia? yes - multiple times per week. No pattern to this, it can occur sometimes when she is late eating a meal or sometimes after she has had a meal. Sometimes is down in 40s. Highest is 240 - no pattern. Medication(s): Ozempic 0.25 mg weekly, Humulin breakfast 70, lunch 50, dinner 50.  Lab Results  Component Value Date   HGBA1C 8.8 (H) 09/23/2021   HGBA1C 9.8 (A) 08/13/2021   HGBA1C 9.9 (H) 05/27/2017   Lab Results  Component Value Date   LDLCALC 109 (H) 09/23/2021   CREATININE 0.90  11/11/2021    Plan: Change Humulin dose to breakfast 65 units, lunch 45 units, dinner 45 units. Call PCP or our office if she continues to have hypoglycemia. Continue Ozempic at 0.25 mg weekly.  2.  Hypertension related to type 2 diabetes Valsartan dose increased from 40 to 60 mg recently by PCP.  She reports home blood pressures have improved. BP Readings from Last 3 Encounters:  12/07/21 132/87  11/11/21 (!) 152/91  11/05/21 124/73   Plan: Refill: valsartan (DIOVAN) 40 MG tablet   Sig: Take 1.5 tablets (60 mg total) by mouth daily.   Dispense:  135 tablet   Refill:  0    3. Obesity: Current BMI 42.03 Lindsey Bowen is currently in the action stage of change. As such, her goal is to continue with weight loss efforts.  She has agreed to keeping a food journal and adhering to recommended goals of 1400-1600 calories and 100 gms protein.   Exercise goals: All adults should avoid inactivity. Some physical activity is better than none, and adults who participate in any amount of physical activity gain some health benefits.  Behavioral modification strategies: increasing lean protein intake and decreasing simple carbohydrates.  Lindsey Bowen has agreed to follow-up with our clinic in 3 weeks.   No orders of the defined types were placed in this encounter.   Medications Discontinued During This Encounter  Medication Reason   valsartan (DIOVAN) 40 MG tablet Reorder   insulin regular human CONCENTRATED (HUMULIN R U-500 KWIKPEN)  500 UNIT/ML [employer] ordered this encounter  Medications   valsartan (DIOVAN) 40 MG tablet    Sig: Take 1.5 tablets (60 mg total) by mouth daily.    Dispense:  135 tablet    Refill:  0    Order Specific Question:   Supervising Provider    Answer:   Quillian Quince D [[id number]]   insulin regular human CONCENTRATED (HUMULIN R U-500 KWIKPEN) 500 UNIT/ML KwikPen    Sig: Inject 65 units under the skin before breakfast and 45 units before lunch and dinner.     Order Specific Question:   Supervising Provider    Answer:   Wilder Glade [[id number]]      Objective:   VITALS: Per patient if applicable, see vitals. GENERAL: Alert and in no acute distress. CARDIOPULMONARY: No increased WOB. Speaking in clear sentences.  PSYCH: Pleasant and cooperative. Speech normal rate and rhythm. Affect is appropriate. Insight and judgement are appropriate. Attention is focused, linear, and appropriate.  NEURO: Oriented as arrived to appointment on time with no prompting.   Lab Results  Component Value Date   CREATININE 0.90 11/11/2021   BUN 9 11/11/2021   NA 142 11/11/2021   K 3.7 11/11/2021   CL 102 11/11/2021   CO2 27 11/11/2021   Lab Results  Component Value Date   ALT 31 11/11/2021   AST 21 11/11/2021   ALKPHOS 78 11/11/2021   BILITOT 0.3 11/11/2021   Lab Results  Component Value Date   HGBA1C 8.8 (H) 09/23/2021   HGBA1C 9.8 (A) 08/13/2021   HGBA1C 9.9 (H) 05/27/2017   No results found for: "INSULIN" Lab Results  Component Value Date   TSH 4.200 09/23/2021   Lab Results  Component Value Date   CHOL 173 09/23/2021   HDL 47 09/23/2021   LDLCALC 109 (H) 09/23/2021   TRIG 95 09/23/2021   CHOLHDL 5.0 (H) 10/20/2020   Lab Results  Component Value Date   WBC 6.8 09/23/2021   HGB 12.3 09/23/2021   HCT 39.1 09/23/2021   MCV 72 (L) 09/23/2021   PLT 305 09/23/2021   Lab Results  Component Value Date   IRON 52 01/29/2021   FERRITIN 59.7 01/29/2021   FERRITIN 59.7 01/29/2021   Lab Results  Component Value Date   VD25OH 13.8 (L) 09/23/2021   VD25OH 19.1 (L) 03/28/2020    Attestation Statements:   Reviewed by clinician on day of visit: allergies, medications, problem list, medical history, surgical history, family history, social history, and previous encounter notes.

## 2021-12-21 ENCOUNTER — Encounter (INDEPENDENT_AMBULATORY_CARE_PROVIDER_SITE_OTHER): Payer: Self-pay | Admitting: Family Medicine

## 2021-12-21 ENCOUNTER — Telehealth (INDEPENDENT_AMBULATORY_CARE_PROVIDER_SITE_OTHER): Payer: Medicaid Other | Admitting: Family Medicine

## 2021-12-21 ENCOUNTER — Telehealth (INDEPENDENT_AMBULATORY_CARE_PROVIDER_SITE_OTHER): Payer: Self-pay | Admitting: Family Medicine

## 2021-12-21 DIAGNOSIS — E669 Obesity, unspecified: Secondary | ICD-10-CM

## 2021-12-21 DIAGNOSIS — Z7985 Long-term (current) use of injectable non-insulin antidiabetic drugs: Secondary | ICD-10-CM

## 2021-12-21 DIAGNOSIS — E1159 Type 2 diabetes mellitus with other circulatory complications: Secondary | ICD-10-CM | POA: Diagnosis not present

## 2021-12-21 DIAGNOSIS — Z794 Long term (current) use of insulin: Secondary | ICD-10-CM

## 2021-12-21 DIAGNOSIS — Z6841 Body Mass Index (BMI) 40.0 and over, adult: Secondary | ICD-10-CM | POA: Diagnosis not present

## 2021-12-21 DIAGNOSIS — R809 Proteinuria, unspecified: Secondary | ICD-10-CM

## 2021-12-21 DIAGNOSIS — I152 Hypertension secondary to endocrine disorders: Secondary | ICD-10-CM | POA: Diagnosis not present

## 2021-12-21 MED ORDER — VALSARTAN 40 MG PO TABS: 60.0000 mg | ORAL_TABLET | Freq: Every day | ORAL | 0 refills | Status: DC

## 2021-12-21 MED ORDER — HUMULIN R U-500 KWIKPEN 500 UNIT/ML ~~LOC~~ SOPN: PEN_INJECTOR | SUBCUTANEOUS | Status: DC

## 2021-12-21 NOTE — Telephone Encounter (Signed)
Patient was a no-show for virtual visit. Left VM advising patient to call to reschedule.

## 2022-01-09 DIAGNOSIS — Z1231 Encounter for screening mammogram for malignant neoplasm of breast: Secondary | ICD-10-CM | POA: Diagnosis not present

## 2022-01-11 ENCOUNTER — Ambulatory Visit (INDEPENDENT_AMBULATORY_CARE_PROVIDER_SITE_OTHER): Payer: Medicaid Other | Admitting: Nurse Practitioner

## 2022-01-11 ENCOUNTER — Encounter (INDEPENDENT_AMBULATORY_CARE_PROVIDER_SITE_OTHER): Payer: Self-pay

## 2022-01-13 ENCOUNTER — Ambulatory Visit (INDEPENDENT_AMBULATORY_CARE_PROVIDER_SITE_OTHER): Payer: Medicaid Other | Admitting: Bariatrics

## 2022-01-15 ENCOUNTER — Encounter: Payer: Self-pay | Admitting: Podiatry

## 2022-01-15 ENCOUNTER — Ambulatory Visit: Payer: Medicaid Other | Admitting: Podiatry

## 2022-01-15 DIAGNOSIS — L600 Ingrowing nail: Secondary | ICD-10-CM | POA: Diagnosis not present

## 2022-01-15 DIAGNOSIS — B353 Tinea pedis: Secondary | ICD-10-CM

## 2022-01-15 DIAGNOSIS — L03031 Cellulitis of right toe: Secondary | ICD-10-CM | POA: Diagnosis not present

## 2022-01-15 MED ORDER — CEPHALEXIN 500 MG PO CAPS: 500.0000 mg | ORAL_CAPSULE | Freq: Three times a day (TID) | ORAL | 0 refills | Status: DC

## 2022-01-15 MED ORDER — CICLOPIROX 0.77 % EX GEL: 1.0000 | Freq: Two times a day (BID) | CUTANEOUS | 0 refills | Status: DC

## 2022-01-15 NOTE — Progress Notes (Unsigned)
Subjective: Chief Complaint  Patient presents with   Ingrown Toenail    Pt came in today for an ingrown toenail on the right foot hallux medial border, which started a mth ago, pt has had some drainage swelling, pt also has fungus bilaterally between the 4 and 5th interspace of her toes no pain, TX: pt has been to the nail salon to take out the ingrown    51 year old female presents to the office for the above complaint. She states the ingrown has been ongoing for a month but worsening over last week. She clipped a small piece of skin trying to get the ingrown out. She states there has been pus and blood from the corner.   Last A1c was 8.8 on 09/23/2021  Objective: AAO x3, NAD DP/PT pulses palpable bilaterally, CRT less than 3 seconds Incurvation present to the medial aspect of right hallux toenail with localized edema and erythema.  In the procedures, purulence was expressed.  See procedure note below.  Tenderness palpation localized edema erythema.  No ascending cellulitis.  No open lesions. Between the fourth and fifth toes bilaterally there is macerated tissue.  There is no skin breakdown, drainage or pus or signs of infection. No pain with calf compression, swelling, warmth, erythema  Assessment: Right medial hallux ingrown toenail ; likely tinea  Plan: -All treatment options discussed with the patient including all alternatives, risks, complications.  -At this time, recommended partial nail removal without chemical matricectomy, I&D to the right medial due to infection. Risks and complications were discussed with the patient for which they understand and  verbally consent to the procedure. Under sterile conditions a total of 3 mL of a mixture of 2% lidocaine plain and 0.5% Marcaine plain was infiltrated in a hallux block fashion. Once anesthetized, the skin was prepped in sterile fashion. A tourniquet was then applied. Next the medial border of the hallux nail border was sharply excised  making sure to remove the entire offending nail border.  Some of the purulence was expressed.  Culture was obtained.  Once the nail was removed, the area was debrided and the underlying skin was intact no further purulence noted. The area was irrigated and hemostasis was obtained.  A dry sterile dressing was applied. After application of the dressing the tourniquet was removed and there is found to be an immediate capillary refill time to the digit. The patient tolerated the procedure well any complications. Post procedure instructions were discussed the patient for which he verbally understood. Follow-up in one week for nail check or sooner if any problems are to arise. Discussed signs/symptoms of worsening infection and directed to call the office immediately should any occur or go directly to the emergency room. In the meantime, encouraged to call the office with any questions, concerns, changes symptoms. -Keflex -Ciclopirox gel -Patient encouraged to call the office with any questions, concerns, change in symptoms.   Vivi Barrack DPM

## 2022-01-15 NOTE — Patient Instructions (Signed)

## 2022-01-18 ENCOUNTER — Other Ambulatory Visit: Payer: Self-pay | Admitting: Pharmacist

## 2022-01-18 NOTE — Chronic Care Management (AMB) (Unsigned)
Chief Complaint  Patient presents with   Hypertension    Lindsey Bowen is a 51 y.o. year old female who presented for a telephone visit.   They were referred to the pharmacist by a quality report for assistance in managing hypertension.   Patient is participating in a Managed Medicaid Plan:  Yes  Subjective:  Care Team: Primary Care Provider: Claiborne Rigg, NP ; Next Scheduled Visit: 02/12/22 Endocrinologist Shawnee Knapp [facility]); Next Scheduled Visit: 02/04/22  Medication Access/Adherence  Current Pharmacy:  [facility] - [place], [place] - [address] [place] [address] Phone: [phone] Fax: [phone]   Patient reports affordability concerns with their medications: No  Patient reports access/transportation concerns to their pharmacy: No  Patient reports adherence concerns with their medications:  No     Diabetes:  Current medications: Ozempic 0.5 mg weekly; Humulin R U500 65 units with breakfast, 45 units with lunch and supper Previous medications: metformin IR and XR - GI upset; Victoza - injection site reaction; Trulicity - tolerated, but little clinical benefit on most recent trial  Notes a decreased appetite. Sometimes 1-2 meals daily, smaller snack meals or fluids. Tries to focus on fruits (apples, bananas, watermelon, grapes);    Current glucose readings: reports some spikes, even when she doesn't eat. 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   Hypertension:  Current medications: valsartan 60 mg daily   Patient has a validated, automated, upper arm home BP cuff; has not checked BP recently, but notes that she feels like it is normal.    Hyperlipidemia/ASCVD Risk Reduction  Current lipid lowering medications: rosuvastatin 20 mg daily    Health Maintenance  Health Maintenance Due  Topic Date Due   COVID-19 Vaccine  (1) Never done   OPHTHALMOLOGY EXAM  Never done   FOOT EXAM  12/16/2020   MAMMOGRAM  01/03/2022     Objective: Lab Results  Component Value Date   HGBA1C 8.8 (H) 09/23/2021    Lab Results  Component Value Date   CREATININE 0.90 11/11/2021   BUN 9 11/11/2021   NA 142 11/11/2021   K 3.7 11/11/2021   CL 102 11/11/2021   CO2 27 11/11/2021    Lab Results  Component Value Date   CHOL 173 09/23/2021   HDL 47 09/23/2021   LDLCALC 109 (H) 09/23/2021   TRIG 95 09/23/2021   CHOLHDL 5.0 (H) 10/20/2020    Medications Reviewed Today     Reviewed by Alden Hipp, RPH-CPP (Pharmacist) on 01/19/22 at [phone]  Med List Status: <None>   Medication Order Taking? Sig Documenting Provider Last Dose Status Informant  acetaminophen (TYLENOL) 650 MG CR tablet [id number] Yes Take 650 mg by mouth every 8 (eight) hours as needed for pain. [provider] Taking Active Self  aspirin-acetaminophen-caffeine (EXCEDRIN MIGRAINE) [phone] MG tablet [id number] No Take 2 tablets by mouth every 6 (six) hours as needed for headache.  Patient not taking: Reported on 01/18/2022   [provider] Not Taking Active   BD PEN NEEDLE NANO 2ND GEN 32G X 4 MM MISC [id number] Yes ONE EACH BY OTHER ROUTE DAILY. [provider] Taking Active   Blood Pressure Monitor DEVI [id number]  Please provide patient with insurance approved blood pressure monitor ICD I 10.0 Claiborne Rigg, NP  Active   cephALEXin (KEFLEX) 500 MG capsule [id number] Yes Take 1 capsule (500 mg total) by  mouth 3 (three) times daily. Vivi Barrack, DPM Taking Active   Ciclopirox 0.77 % gel [id number] No Apply 1 Application topically 2 (two) times daily.  Patient not taking: Reported on 01/18/2022   Vivi Barrack, DPM Not Taking Active   Continuous Blood Gluc Sensor (DEXCOM G6 SENSOR) MISC [id number] Yes 1 each by Other route See admin instructions. Use one sensor once every 10 days to monitor blood sugars. [provider] Taking Active   Continuous Blood Gluc Transmit (DEXCOM G6 TRANSMITTER) MISC [id number] Yes 1 each by Other route See admin instructions. Use one transmitter once every 90 days to transmit blood sugars. [provider] Taking Active   EPINEPHrine 0.3 mg/0.3 mL IJ SOAJ injection [id number]  Inject 0.3 mg into the muscle as needed for anaphylaxis. Claiborne Rigg, NP  Active            Med Note [facility], NOAH F   Wed Sep 23, 2021 10:29 AM)    fluticasone Aleda Grana) 50 MCG/ACT nasal spray [id number] Yes Place 2 sprays into both nostrils daily as needed for allergies or rhinitis. [provider] Taking Active   ibuprofen (ADVIL) 200 MG tablet [id number] Yes Take 400 mg by mouth every 6 (six) hours as needed. [provider] Taking Active   insulin regular human CONCENTRATED (HUMULIN R U-500 KWIKPEN) 500 UNIT/ML KwikPen [id number] Yes Inject 65 units under the skin before breakfast and 45 units before lunch and dinner. Whitmire, Thermon Leyland, FNP Taking Active   levocetirizine (XYZAL) 5 MG tablet [id number] Yes Take 5 mg by mouth daily as needed. [provider] Taking Active   levonorgestrel (LILETTA, 52 MG,) 20.1 MCG/DAY IUD [id number] Yes 1 each by Intrauterine route once. [provider] Taking Active   levothyroxine (SYNTHROID) 175 MCG tablet [id number] Yes Take 175 mcg by mouth daily before breakfast. Take 1 tablet by mouth 6 days per week. [provider] Taking Active   methocarbamol (ROBAXIN) 500 MG tablet [id number] No Take 500 mg by mouth daily as needed for muscle spasms.  Patient not taking: Reported on 01/18/2022   [provider] Not Taking Active   OZEMPIC, 0.25 OR 0.5 MG/DOSE, 2 MG/3ML SOPN [id number] Yes Inject 0.25 mg into the skin once a week. [provider] Taking Active   PROAIR HFA [phone] 5684 Base) MCG/ACT inhaler [id number] Yes INHALE 1-2 PUFFS BY MOUTH EVERY 6 HOURS AS NEEDED FOR WHEEZE OR SHORTNESS OF Tonette Bihari, NP Taking Active   rosuvastatin (CRESTOR) 20 MG tablet [id number] Yes Take 1 tablet (20 mg total) by mouth at bedtime. Claiborne Rigg, NP Taking Active   sennosides-docusate sodium (SENOKOT-S) 8.6-50 MG tablet [id number] Yes Take 2 tablets by mouth daily. Claiborne Rigg, NP Taking Active   valsartan (DIOVAN) 40 MG tablet [id number] Yes Take 1.5 tablets (60 mg total) by mouth daily. Whitmire, Thermon Leyland, FNP Taking Active   Vitamin D, Ergocalciferol, (DRISDOL) 1.25 MG (50000 UNIT) CAPS capsule [id number] Yes Take 1 capsule (50,000 Units total) by mouth every 7 (seven) days. Claiborne Rigg, NP Taking Active               Assessment/Plan:   Diabetes: - Currently uncontrolled but improving - Reviewed goal A1c, goal fasting, and goal 2 hour post prandial glucose - Reviewed dietary modifications including importance of regular meals that incorporate protein. Discussed sources of protein. Encouraged to discuss with [facility] at next appointment.  - Recommended to continue current regimen  at this time, follow up with Dr. Shawnee Knapp at upcoming appointment. Consider addition of SGLT2.   Hypertension: - Currently controlled - Reviewed appropriate blood pressure monitoring technique and reviewed goal blood pressure. Recommended to check home blood pressure and heart rate periodically at home, document, and bring to provider appointments.   Hyperlipidemia/ASCVD Risk Reduction: - Currently uncontrolled, recommended updated lipid panel - Recommend to continue current regimen at this time   Follow Up Plan: phone call in 6 weeks  [name], PharmD, [facility] [phone]

## 2022-01-19 LAB — WOUND CULTURE
MICRO NUMBER:: 13678891
SPECIMEN QUALITY:: ADEQUATE

## 2022-01-19 NOTE — Patient Instructions (Signed)
Kandyce,   It was great talking to you today!  I recommend incorporating protein into your snacks - no cheese- I'll try to remember that you don't like it! Things like hard boiled eggs, low carb protein shakes, a small serving of peanut butter, greek yogurt, etc. Focusing on proteins, vegetables and fruits, and whole grains. Talk to [facility] about some other options or tips she has.   Check your blood pressure periodically, and any time you have concerning symptoms like headache, chest pain, dizziness, shortness of breath, or vision changes.   Our goal is less than 130/80.  To appropriately check your blood pressure, make sure you do the following:  1) Avoid caffeine, exercise, or tobacco products for 30 minutes before checking. Empty your bladder. 2) Sit with your back supported in a flat-backed chair. Rest your arm on something flat (arm of the chair, table, etc). 3) Sit still with your feet flat on the floor, resting, for at least 5 minutes.  4) Check your blood pressure. Take 1-2 readings.  5) Write down these readings and bring with you to any provider appointments.  Bring your home blood pressure machine with you to a provider's office for accuracy comparison at least once a year.   Make sure you take your blood pressure medications before you come to any office visit, even if you were asked to fast for labs.  Take care!  [name], PharmD, [facility] [phone]

## 2022-01-28 NOTE — Progress Notes (Addendum)
TeleHealth Visit:  This visit was completed with telemedicine (audio/video) technology. Lindsey Bowen has verbally consented to this TeleHealth visit. The patient is located at home, the provider is located at home. The participants in this visit include the listed provider and patient. The visit was conducted today via MyChart video.  OBESITY Lindsey Bowen is here to discuss her progress with her obesity treatment plan along with follow-up of her obesity related diagnoses.   Today's visit was # 6 Starting weight: 247 lbs Starting date: 09/23/2021 Weight at last in office visit: 245 lbs on 11/05/21 Total weight loss: 2 lbs at last in office visit on 11/05/21. Today's reported weight: 240 lbs   Nutrition Plan: keeping a food journal and adhering to recommended goals of 1400-1600 calories and 100 protein.  Hunger is well controlled.  Current exercise: none   Interim History: Lindsey Bowen has lost 4 pounds according to her reported weights last visit and this visit.  Reports that journaling is not working for her.  Category 3 also did not work well for her.  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  She lost most of her food assistance and now only gets $53 per month for groceries.  She does a very good job of shopping in bulk at [employer] and then breaking things down and freezing portions.  Her kids actually cook dinner during the week and then Lindsey Bowen cooks on the weekends if she is not too tired. She is doing better with eating regularly and her blood sugars are more stable.  Typical food intake: Breakfast-eggs Lunch-sandwich Dinner -kids cook, but always has a protein and vegetable.  Lindsey Bowen was late for her visit with Irene Limbo, NP on 01/11/2022.  She was rescheduled with Dr. Manson Passey for 01/13/2022 but forgot about that appointment.  Assessment/Plan:  1. Type II Diabetes with microalbuminuria, with long-term use of insulin. A1c elevated at 8.8  (09/23/2021) but improved from 9.8 (08/13/2021).  Managed by Dr. Darel Hong health. CBGs: Has CGM.  Weekly average 57% in range.  Fasting 150-200. Episodes of hypoglycemia: no.  Much better since insulin dose reduced last office visit. Medication(s): Humulin: breakfast 65 units, lunch 45 units, dinner 45 units. Ozempic 0.25 mg weekly.   Lab Results  Component Value Date   HGBA1C 8.8 (H) 09/23/2021   HGBA1C 9.8 (A) 08/13/2021   HGBA1C 9.9 (H) 05/27/2017   Lab Results  Component Value Date   LDLCALC 109 (H) 09/23/2021   CREATININE 0.90 11/11/2021    Plan: Follow-up with Dr. Shawnee Knapp on 02/04/2022.   Continue insulin and Ozempic at current dose. Continue to eat regular meals.   2. Hyperlipidemia On Crestor 20 mg daily-LDL not at goal.  109 on 09/23/2021. Cardiovascular risk factors: diabetes mellitus, dyslipidemia, hypertension, microalbuminuria, obesity (BMI >= 30 kg/m2), and sedentary lifestyle  Lab Results  Component Value Date   CHOL 173 09/23/2021   HDL 47 09/23/2021   LDLCALC 109 (H) 09/23/2021   TRIG 95 09/23/2021   CHOLHDL 5.0 (H) 10/20/2020   Lab Results  Component Value Date   ALT 31 11/11/2021   AST 21 11/11/2021   ALKPHOS 78 11/11/2021   BILITOT 0.3 11/11/2021   The 10-year ASCVD risk score (Arnett DK, et al., 2019) is: 9.3%   Values used to calculate the score:     Age: 51 years     Sex: Female     Is Non-Hispanic African American: Yes     Diabetic: Yes  Tobacco smoker: No     Systolic Blood Pressure: 132 mmHg     Is BP treated: Yes     HDL Cholesterol: 47 mg/dL     Total Cholesterol: 173 mg/dL  Plan: Continue Crestor 20 mg daily. Check labs within the next few visits if not checked at endocrinology visit.  3. Obesity: Current BMI 42.03 Lindsey Bowen is currently in the action stage of change. As such, her goal is to continue with weight loss efforts.  She has agreed to practicing portion control and making smarter food choices, such as increasing  vegetables and decreasing simple carbohydrates.   Exercise goals: No exercise has been prescribed at this time.  Behavioral modification strategies: increasing lean protein intake, decreasing simple carbohydrates, and no skipping meals.  Lindsey Bowen has agreed to follow-up with our clinic in 3 weeks.   No orders of the defined types were placed in this encounter.   There are no discontinued medications.   No orders of the defined types were placed in this encounter.     Objective:   VITALS: Per patient if applicable, see vitals. GENERAL: Alert and in no acute distress. CARDIOPULMONARY: No increased WOB. Speaking in clear sentences.  PSYCH: Pleasant and cooperative. Speech normal rate and rhythm. Affect is appropriate. Insight and judgement are appropriate. Attention is focused, linear, and appropriate.  NEURO: Oriented as arrived to appointment on time with no prompting.   Lab Results  Component Value Date   CREATININE 0.90 11/11/2021   BUN 9 11/11/2021   NA 142 11/11/2021   K 3.7 11/11/2021   CL 102 11/11/2021   CO2 27 11/11/2021   Lab Results  Component Value Date   ALT 31 11/11/2021   AST 21 11/11/2021   ALKPHOS 78 11/11/2021   BILITOT 0.3 11/11/2021   Lab Results  Component Value Date   HGBA1C 8.8 (H) 09/23/2021   HGBA1C 9.8 (A) 08/13/2021   HGBA1C 9.9 (H) 05/27/2017   No results found for: "INSULIN" Lab Results  Component Value Date   TSH 4.200 09/23/2021   Lab Results  Component Value Date   CHOL 173 09/23/2021   HDL 47 09/23/2021   LDLCALC 109 (H) 09/23/2021   TRIG 95 09/23/2021   CHOLHDL 5.0 (H) 10/20/2020   Lab Results  Component Value Date   WBC 6.8 09/23/2021   HGB 12.3 09/23/2021   HCT 39.1 09/23/2021   MCV 72 (L) 09/23/2021   PLT 305 09/23/2021   Lab Results  Component Value Date   IRON 52 01/29/2021   FERRITIN 59.7 01/29/2021   FERRITIN 59.7 01/29/2021   Lab Results  Component Value Date   VD25OH 13.8 (L) 09/23/2021   VD25OH 19.1  (L) 03/28/2020    Attestation Statements:   Reviewed by clinician on day of visit: allergies, medications, problem list, medical history, surgical history, family history, social history, and previous encounter notes.  Time spent on visit including the items listed below was 34 minutes.  -preparing to see the patient (e.g., review of tests, history, previous notes) -obtaining and/or reviewing separately obtained history -counseling and educating the patient/family/caregiver -documenting clinical information in the electronic or other health record

## 2022-01-29 ENCOUNTER — Telehealth: Payer: Self-pay

## 2022-01-29 ENCOUNTER — Ambulatory Visit: Payer: Medicaid Other | Admitting: Podiatry

## 2022-01-29 DIAGNOSIS — R609 Edema, unspecified: Secondary | ICD-10-CM

## 2022-01-29 DIAGNOSIS — Z79899 Other long term (current) drug therapy: Secondary | ICD-10-CM

## 2022-01-29 DIAGNOSIS — B353 Tinea pedis: Secondary | ICD-10-CM | POA: Diagnosis not present

## 2022-01-29 DIAGNOSIS — B351 Tinea unguium: Secondary | ICD-10-CM | POA: Diagnosis not present

## 2022-01-29 MED ORDER — TERBINAFINE HCL 250 MG PO TABS: 250.0000 mg | ORAL_TABLET | Freq: Every day | ORAL | 0 refills | Status: DC

## 2022-01-29 NOTE — Progress Notes (Signed)
Subjective: 51 year old female presents the office referral for evaluation of right hallux.  This appears to be doing well and is healing.  There is no purulence or drainage.  She is not able to get the topical antifungal due to insurance.  She has noticed some breakdown of the skin on the right foot between the fourth and fifth toes.  No drainage or pus.  Objective: AAO x3, NAD DP/PT pulses palpable bilaterally, CRT less than 3 seconds Status post partial nail avulsion which appears to be healing well.  There is no edema, erythema no drainage or pus.  No signs of infection.  Hallux nails are hypertrophic, dystrophic with yellow, brown discoloration.  No edema, erythema or signs of infection. Macerated skin on the fourth interspace bilaterally and there is superficial skin breakdown on the right foot without any drainage or pus. She has chronic edema she is tried over-the-counter compression socks which helps some. No pain with calf compression, swelling, warmth, erythema  Assessment: 51 year old female with onychomycosis, tinea pedis  Plan: -All treatment options discussed with the patient including all alternatives, risks, complications.  -Procedure site appears to be healing well. -Given the nail fungus we discussed oral, topical as well as alternative treatments for nail fungus.  At this point we will do oral Lamisil.  Discussed side effects.  We will check a CBC and LFT prior to starting the medication. -Discussed drying thoroughly between the digits. -Prescription for compression socks given. -Patient encouraged to call the office with any questions, concerns, change in symptoms.   Vivi Barrack DPM

## 2022-01-29 NOTE — Telephone Encounter (Signed)
01/29/22- [facility] has not replied to your PA request. Turnaround time for review of a PA request is dependent upon insurance plan and can range from 24 hours to 5 calendar days.

## 2022-01-29 NOTE — Telephone Encounter (Signed)
Noted, thanks!

## 2022-01-29 NOTE — Telephone Encounter (Addendum)
Ciclopirox 0.77 % gel    Prior authorization was started today on 01/29/22.   01/29/22-PA Case: [id number], Status: Denied.  Paperwork is in Writer.

## 2022-01-29 NOTE — Patient Instructions (Signed)
Get blood work rechecked in 6 weeks at [facility]  Terbinafine Tablets What is this medication? TERBINAFINE (TER bin a feen) treats fungal infections of the nails. It belongs to a group of medications called antifungals. It will not treat infections caused by bacteria or viruses. This medicine may be used for other purposes; ask your health care provider or pharmacist if you have questions. COMMON BRAND NAME(S): Lamisil, Terbinex What should I tell my care team before I take this medication? They need to know if you have any of these conditions: Liver disease An unusual or allergic reaction to terbinafine, other medications, foods, dyes, or preservatives Pregnant or trying to get pregnant Breast-feeding How should I use this medication? Take this medication by mouth with water. Take it as directed on the prescription label at the same time every day. You can take it with or without food. If it upsets your stomach, take it with food. Keep taking it unless your care team tells you to stop. A special MedGuide will be given to you by the pharmacist with each prescription and refill. Be sure to read this information carefully each time. Talk to your care team regarding the use of this medication in children. Special care may be needed. Overdosage: If you think you have taken too much of this medicine contact a poison control center or emergency room at once. NOTE: This medicine is only for you. Do not share this medicine with others. What if I miss a dose? If you miss a dose, take it as soon as you can unless it is more than 4 hours late. If it is more than 4 hours late, skip the missed dose. Take the next dose at the normal time. What may interact with this medication? Do not take this medication with any of the following: Pimozide Thioridazine This medication may also interact with the following: Beta blockers Caffeine Certain medications for mental health  conditions Cimetidine Cyclosporine Medications for fungal infections like fluconazole and ketoconazole Medications for irregular heartbeat like amiodarone, flecainide and propafenone Rifampin Warfarin This list may not describe all possible interactions. Give your health care provider a list of all the medicines, herbs, non-prescription drugs, or dietary supplements you use. Also tell them if you smoke, drink alcohol, or use illegal drugs. Some items may interact with your medicine. What should I watch for while using this medication? Visit your care team for regular checks on your progress. You may need blood work while you are taking this medication. It may be some time before you see the benefit from this medication. This medication may cause serious skin reactions. They can happen weeks to months after starting the medication. Contact your care team right away if you notice fevers or flu-like symptoms with a rash. The rash may be red or purple and then turn into blisters or peeling of the skin. Or, you might notice a red rash with swelling of the face, lips or lymph nodes in your neck or under your arms. This medication can make you more sensitive to the sun. Keep out of the sun, If you cannot avoid being in the sun, wear protective clothing and sunscreen. Do not use sun lamps or tanning beds/booths. What side effects may I notice from receiving this medication? Side effects that you should report to your care team as soon as possible: Allergic reactions--skin rash, itching, hives, swelling of the face, lips, tongue, or throat Change in sense of smell Change in taste Infection--fever, chills, cough, or sore  throat Liver injury--right upper belly pain, loss of appetite, nausea, light-colored stool, dark yellow or brown urine, yellowing skin or eyes, unusual weakness or fatigue Low red blood cell level--unusual weakness or fatigue, dizziness, headache, trouble breathing Lupus-like  syndrome--joint pain, swelling, or stiffness, butterfly-shaped rash on the face, rashes that get worse in the sun, fever, unusual weakness or fatigue Rash, fever, and swollen lymph nodes Redness, blistering, peeling, or loosening of the skin, including inside the mouth Unusual bruising or bleeding Worsening mood, feelings of depression Side effects that usually do not require medical attention (report to your care team if they continue or are bothersome): Diarrhea Gas Headache Nausea Stomach pain Upset stomach This list may not describe all possible side effects. Call your doctor for medical advice about side effects. You may report side effects to FDA at 1-800-FDA-1088. Where should I keep my medication? Keep out of the reach of children and pets. Store between 20 and 25 degrees C (68 and 77 degrees F). Protect from light. Get rid of any unused medication after the expiration date. To get rid of medications that are no longer needed or have expired: Take the medication to a medication take-back program. Check with your pharmacy or law enforcement to find a location. If you cannot return the medication, check the label or package insert to see if the medication should be thrown out in the garbage or flushed down the toilet. If you are not sure, ask your care team. If it is safe to put it in the trash, take the medication out of the container. Mix the medication with cat litter, dirt, coffee grounds, or other unwanted substance. Seal the mixture in a bag or container. Put it in the trash. NOTE: This sheet is a summary. It may not cover all possible information. If you have questions about this medicine, talk to your doctor, pharmacist, or health care provider.  2023 Elsevier/Gold Standard (2021-01-06 00:00:00)

## 2022-02-01 ENCOUNTER — Telehealth (INDEPENDENT_AMBULATORY_CARE_PROVIDER_SITE_OTHER): Payer: Medicaid Other | Admitting: Family Medicine

## 2022-02-01 ENCOUNTER — Encounter (INDEPENDENT_AMBULATORY_CARE_PROVIDER_SITE_OTHER): Payer: Self-pay | Admitting: Family Medicine

## 2022-02-01 DIAGNOSIS — E1169 Type 2 diabetes mellitus with other specified complication: Secondary | ICD-10-CM | POA: Diagnosis not present

## 2022-02-01 DIAGNOSIS — Z7985 Long-term (current) use of injectable non-insulin antidiabetic drugs: Secondary | ICD-10-CM

## 2022-02-01 DIAGNOSIS — Z794 Long term (current) use of insulin: Secondary | ICD-10-CM | POA: Diagnosis not present

## 2022-02-01 DIAGNOSIS — R809 Proteinuria, unspecified: Secondary | ICD-10-CM | POA: Diagnosis not present

## 2022-02-01 DIAGNOSIS — E1129 Type 2 diabetes mellitus with other diabetic kidney complication: Secondary | ICD-10-CM | POA: Diagnosis not present

## 2022-02-01 DIAGNOSIS — Z6841 Body Mass Index (BMI) 40.0 and over, adult: Secondary | ICD-10-CM | POA: Diagnosis not present

## 2022-02-01 DIAGNOSIS — E785 Hyperlipidemia, unspecified: Secondary | ICD-10-CM | POA: Diagnosis not present

## 2022-02-01 DIAGNOSIS — E669 Obesity, unspecified: Secondary | ICD-10-CM | POA: Diagnosis not present

## 2022-02-03 ENCOUNTER — Encounter (INDEPENDENT_AMBULATORY_CARE_PROVIDER_SITE_OTHER): Payer: Self-pay

## 2022-02-04 DIAGNOSIS — E1159 Type 2 diabetes mellitus with other circulatory complications: Secondary | ICD-10-CM | POA: Diagnosis not present

## 2022-02-04 DIAGNOSIS — E039 Hypothyroidism, unspecified: Secondary | ICD-10-CM | POA: Diagnosis not present

## 2022-02-04 DIAGNOSIS — E1129 Type 2 diabetes mellitus with other diabetic kidney complication: Secondary | ICD-10-CM | POA: Diagnosis not present

## 2022-02-04 DIAGNOSIS — E785 Hyperlipidemia, unspecified: Secondary | ICD-10-CM | POA: Diagnosis not present

## 2022-02-04 DIAGNOSIS — Z794 Long term (current) use of insulin: Secondary | ICD-10-CM | POA: Diagnosis not present

## 2022-02-04 DIAGNOSIS — E1169 Type 2 diabetes mellitus with other specified complication: Secondary | ICD-10-CM | POA: Diagnosis not present

## 2022-02-04 DIAGNOSIS — R809 Proteinuria, unspecified: Secondary | ICD-10-CM | POA: Diagnosis not present

## 2022-02-04 DIAGNOSIS — I152 Hypertension secondary to endocrine disorders: Secondary | ICD-10-CM | POA: Diagnosis not present

## 2022-02-12 ENCOUNTER — Ambulatory Visit: Payer: Medicaid Other | Attending: Nurse Practitioner | Admitting: Nurse Practitioner

## 2022-02-12 ENCOUNTER — Encounter: Payer: Self-pay | Admitting: Nurse Practitioner

## 2022-02-12 VITALS — BP 136/85 | HR 85 | Temp 98.0°F | Ht 64.0 in | Wt 238.0 lb

## 2022-02-12 DIAGNOSIS — G4733 Obstructive sleep apnea (adult) (pediatric): Secondary | ICD-10-CM

## 2022-02-12 DIAGNOSIS — I10 Essential (primary) hypertension: Secondary | ICD-10-CM

## 2022-02-12 DIAGNOSIS — K5909 Other constipation: Secondary | ICD-10-CM

## 2022-02-12 MED ORDER — LINACLOTIDE 72 MCG PO CAPS
72.0000 ug | ORAL_CAPSULE | Freq: Every day | ORAL | 3 refills | Status: DC
Start: 2022-02-12 — End: 2023-08-29

## 2022-02-12 NOTE — Progress Notes (Unsigned)
Assessment & Plan:  There are no diagnoses linked to this encounter.  Patient has been counseled on age-appropriate routine health concerns for screening and prevention. These are reviewed and up-to-date. Referrals have been placed accordingly. Immunizations are up-to-date or declined.    Subjective:   Chief Complaint  Patient presents with   Hypertension   HPI Lindsey Bowen 51 y.o. female presents to office today for HTN   Constipation with ozempic. Taking senna 2 tablets in the morning. Work hours 7-730:430-600am. 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    Past Surgical History:  Procedure Laterality Date   CHOLECYSTECTOMY     DILATION AND CURETTAGE OF UTERUS     HERNIA REPAIR      Family History  Problem Relation Age of Onset   Diabetes Mother    Hypertension Mother    Addison's disease Mother    Asthma Mother    Colon polyps Mother    Irritable bowel syndrome Mother    Glaucoma Father    Mental retardation Sister    Glaucoma Sister    Heart attack Maternal Aunt    Heart attack Maternal Uncle    Diabetes Paternal Uncle    Heart attack Maternal Grandmother    Stroke Maternal Grandmother    Ulcerative colitis Maternal Grandfather    Diabetes Maternal  Grandfather    Stomach cancer Maternal Grandfather    Diabetes Paternal Grandmother    Irritable bowel syndrome Daughter    Diabetes Son    Colon cancer Neg Hx    Esophageal cancer Neg Hx    Rectal cancer Neg Hx     Social History Reviewed with no changes to be made today.   Outpatient Medications Prior to Visit  Medication Sig Dispense Refill   acetaminophen (TYLENOL) 650 MG CR tablet Take 650 mg by mouth every 8 (eight) hours as needed for pain.     aspirin-acetaminophen-caffeine (EXCEDRIN MIGRAINE) 250-250-65 MG tablet Take 2 tablets by mouth every 6 (six) hours as needed for headache. (Patient not taking: Reported on 01/18/2022)     BD PEN NEEDLE NANO 2ND GEN 32G X 4 MM MISC ONE EACH BY OTHER ROUTE DAILY.     Blood Pressure Monitor DEVI Please provide patient with insurance approved blood pressure monitor ICD I 10.0 1 each 0   cephALEXin (KEFLEX) 500 MG capsule Take 1 capsule (500 mg total) by mouth 3 (three) times daily. (Patient not taking: Reported on 02/12/2022) 21 capsule 0   Ciclopirox 0.77 % gel Apply 1 Application topically 2 (two) times daily. (Patient not taking: Reported on 01/18/2022) 45 g  0   Continuous Blood Gluc Sensor (DEXCOM G6 SENSOR) MISC 1 each by Other route See admin instructions. Use one sensor once every 10 days to monitor blood sugars. (Patient not taking: Reported on 02/12/2022)     Continuous Blood Gluc Transmit (DEXCOM G6 TRANSMITTER) MISC 1 each by Other route See admin instructions. Use one transmitter once every 90 days to transmit blood sugars. (Patient not taking: Reported on 02/12/2022)     EPINEPHrine 0.3 mg/0.3 mL IJ SOAJ injection Inject 0.3 mg into the muscle as needed for anaphylaxis. 1 each 1   fluticasone (FLONASE) 50 MCG/ACT nasal spray Place 2 sprays into both nostrils daily as needed for allergies or rhinitis.     ibuprofen (ADVIL) 200 MG tablet Take 400 mg by mouth every 6 (six) hours as needed.     insulin regular human CONCENTRATED (HUMULIN R  U-500 KWIKPEN) 500 UNIT/ML KwikPen Inject 65 units under the skin before breakfast and 45 units before lunch and dinner.     levocetirizine (XYZAL) 5 MG tablet Take 5 mg by mouth daily as needed.     levonorgestrel (LILETTA, 52 MG,) 20.1 MCG/DAY IUD 1 each by Intrauterine route once.     levothyroxine (SYNTHROID) 175 MCG tablet Take 175 mcg by mouth daily before breakfast. Take 1 tablet by mouth 6 days per week.     methocarbamol (ROBAXIN) 500 MG tablet Take 500 mg by mouth daily as needed for muscle spasms. (Patient not taking: Reported on 01/18/2022)     OZEMPIC, 0.25 OR 0.5 MG/DOSE, 2 MG/3ML SOPN Inject 0.25 mg into the skin once a week.     PROAIR HFA 108 (90 Base) MCG/ACT inhaler INHALE 1-2 PUFFS BY MOUTH EVERY 6 HOURS AS NEEDED FOR WHEEZE OR SHORTNESS OF BREATH 18 g 1   rosuvastatin (CRESTOR) 20 MG tablet Take 1 tablet (20 mg total) by mouth at bedtime. 90 tablet 1   sennosides-docusate sodium (SENOKOT-S) 8.6-50 MG tablet Take 2 tablets by mouth daily. 60 tablet 6   terbinafine (LAMISIL) 250 MG tablet Take 1 tablet (250 mg total) by mouth daily. 90 tablet 0   valsartan (DIOVAN) 40 MG tablet Take 1.5 tablets (60 mg total) by mouth daily. 135 tablet 0   Vitamin D, Ergocalciferol, (DRISDOL) 1.25 MG (50000 UNIT) CAPS capsule Take 1 capsule (50,000 Units total) by mouth every 7 (seven) days. 12 capsule 0   No facility-administered medications prior to visit.    Allergies  Allergen Reactions   Dust Mite Extract Hives and Itching   Shellfish Allergy Anaphylaxis       Objective:    BP 136/85   Pulse 85   Temp 98 F (36.7 C) (Oral)   Ht 5\' 4"  (1.626 m)   Wt 238 lb (108 kg)   SpO2 98%   BMI 40.85 kg/m  Wt Readings from Last 3 Encounters:  02/12/22 238 lb (108 kg)  11/11/21 249 lb (112.9 kg)  11/05/21 245 lb (111.1 kg)    Physical Exam       Patient has been counseled extensively about nutrition and exercise as well as the importance of adherence with medications and regular  follow-up. The patient was given clear instructions to go to ER or return to medical center if symptoms don't improve, worsen or new problems develop. The patient verbalized understanding.   Follow-up: No follow-ups on file.   01/05/22, FNP-BC [facility] [place], [place]   02/12/2022, 3:48 PM

## 2022-02-13 ENCOUNTER — Encounter: Payer: Self-pay | Admitting: Nurse Practitioner

## 2022-02-22 ENCOUNTER — Ambulatory Visit: Payer: Medicaid Other

## 2022-02-22 ENCOUNTER — Other Ambulatory Visit: Payer: Medicaid Other | Admitting: Pharmacist

## 2022-02-22 DIAGNOSIS — E118 Type 2 diabetes mellitus with unspecified complications: Secondary | ICD-10-CM

## 2022-02-22 DIAGNOSIS — E1159 Type 2 diabetes mellitus with other circulatory complications: Secondary | ICD-10-CM

## 2022-02-22 NOTE — Progress Notes (Unsigned)
Chief Complaint  Patient presents with   Medication Management   Hypertension   Diabetes    Lindsey Bowen is a 51 y.o. year old female who presented for a telephone visit.   They were referred to the pharmacist by a quality report for assistance in managing hypertension.   Patient is participating in a Managed Medicaid Plan:  Yes  Subjective:  Care Team: Primary Care Provider: Claiborne Rigg, NP ; Next Scheduled Visit: not scheduled Endocrinologist Shawnee Knapp [facility]); Next Scheduled Visit: 05/18/22 [facility]; Next Scheduled Visit: 8/30; 03/17/22  Medication Access/Adherence  Current Pharmacy:  [facility] - [place], [place] - [address] [place] [address] Phone: [phone] Fax: [phone]   Patient reports affordability concerns with their medications: No  Patient reports access/transportation concerns to their pharmacy: No  Patient reports adherence concerns with their medications:  No    Stress lately due to registration issues with her daughter.   Diabetes:  Current medications: Ozempic 0.5 mg weekly, Humulin R U500 - 65/55/55 Medications tried in the past: metformin IR and XR - GI upset; Victoza - injection site reaction; Trulicity - tolerated, but little clinical benefit on most recent trial  Does report stomach issues prior to going on Ozempic, constipation improved with senna. Bowel movements were normal when first starting Ozempic. Lindsey started on Linzess last week.   Current glucose readings: fasting: low-mid 100s, more often higher 100s  DexCom G7 - reports GMI ~ 8.6%  Physical activity: has restarted dancing; most exercise lately is 3-6k steps at work  Hypertension:  Current medications: valsartan 80 mg daily  Patient has a validated, automated, upper arm home BP cuff Current blood pressure readings readings: 130/80s    Hyperlipidemia/ASCVD Risk Reduction  Current lipid  lowering medications: rosuvastatin 20 mg    Health Maintenance  Health Maintenance Due  Topic Date Due   COVID-19 Vaccine (1) Never done   OPHTHALMOLOGY EXAM  Never done     Objective: Lab Results  Component Value Date   HGBA1C 8.8 (H) 09/23/2021     Lab Results  Component Value Date   CREATININE 0.90 11/11/2021   BUN 9 11/11/2021   NA 142 11/11/2021   K 3.7 11/11/2021   CL 102 11/11/2021   CO2 27 11/11/2021    Lab Results  Component Value Date   CHOL 173 09/23/2021   HDL 47 09/23/2021   LDLCALC 109 (H) 09/23/2021   TRIG 95 09/23/2021   CHOLHDL 5.0 (H) 10/20/2020    Medications Reviewed Today     Reviewed by Alden Hipp, RPH-CPP (Pharmacist) on 02/22/22 at 1635  Med List Status: <None>   Medication Order Taking? Sig Documenting Provider Last Dose Status Informant  acetaminophen (TYLENOL) 650 MG CR tablet [id number] Yes Take 650 mg by mouth every 8 (eight) hours as needed for pain. [provider] Taking Active Self  BD PEN NEEDLE NANO 2ND GEN 32G X 4 MM MISC [id number] Yes ONE EACH BY OTHER ROUTE DAILY. [provider] Taking Active   Blood Pressure Monitor DEVI [id number] Yes Please provide patient with insurance approved blood pressure monitor ICD I 10.0 Lindsey Bowen, Lindsey W, NP Taking Active   EPINEPHrine 0.3 mg/0.3 mL IJ SOAJ injection [id number] Yes Inject 0.3 mg into the muscle as needed for anaphylaxis. Claiborne Rigg, NP Taking Active            Med Note [facility], NOAH F   Wed Sep 23, 2021 10:29 AM)    fluticasone (FLONASE) 50 MCG/ACT nasal spray [id number] No Place 2 sprays into both nostrils daily as needed for allergies or rhinitis.  Patient not taking: Reported on 02/22/2022   [provider] Not Taking Active   ibuprofen (ADVIL) 200 MG tablet [id number] No Take 400 mg by mouth every 6 (six) hours as needed.  Patient not taking: Reported on 02/22/2022   [provider] Not Taking Active   insulin regular human  CONCENTRATED (HUMULIN R U-500 KWIKPEN) 500 UNIT/ML KwikPen [id number] Yes Inject 65 units under the skin before breakfast and 45 units before lunch and dinner.  Patient taking differently: Inject 65 units under the skin before breakfast and 55 units before lunch and dinner.   [place], Thermon Leyland, FNP Taking Active   levocetirizine (XYZAL) 5 MG tablet [id number] No Take 5 mg by mouth daily as needed.  Patient not taking: Reported on 02/22/2022   [provider] Not Taking Active   levonorgestrel (LILETTA, 52 MG,) 20.1 MCG/DAY IUD [id number] Yes 1 each by Intrauterine route once. [provider] Taking Active   levothyroxine (SYNTHROID) 175 MCG tablet [id number] Yes Take 175 mcg by mouth daily before breakfast. Take 1 tablet by mouth 6 days per week and 1/2 tablet on Sundays [provider] Taking Active   linaclotide Karlene Einstein) 72 MCG capsule [id number] Yes Take 1 capsule (72 mcg total) by mouth daily before breakfast. Claiborne Rigg, NP Taking Active   methocarbamol (ROBAXIN) 500 MG tablet [id number] Yes Take 500 mg by mouth daily as needed for muscle spasms. [provider] Taking Active   OZEMPIC, 0.25 OR 0.5 MG/DOSE, 2 MG/3ML SOPN [id number] Yes Inject 0.5 mg into the skin once a week. [provider] Taking Active   PROAIR HFA 108 [id number] Base) MCG/ACT inhaler [id number]  INHALE 1-2 PUFFS BY MOUTH EVERY 6 HOURS AS NEEDED FOR WHEEZE OR SHORTNESS OF BREATH Claiborne Rigg, NP  Active   rosuvastatin (CRESTOR) 20 MG tablet [id number] Yes Take 1 tablet (20 mg total) by mouth at bedtime. Claiborne Rigg, NP Taking Active   sennosides-docusate sodium (SENOKOT-S) 8.6-50 MG tablet [id number] Yes Take 2 tablets by mouth daily. Claiborne Rigg, NP Taking Active   terbinafine (LAMISIL) 250 MG tablet [id number] Yes Take 1 tablet (250 mg total) by mouth daily. Vivi Barrack, DPM Taking Active   valsartan (DIOVAN) 80 MG tablet [id number] Yes Take 80 mg by mouth daily. [provider] Taking Active   Vitamin D, Ergocalciferol, (DRISDOL) 1.25 MG (50000 UNIT) CAPS capsule [id number] Yes Take 1 capsule (50,000 Units total) by mouth every 7 (seven) days. Claiborne Rigg, NP Taking Active               Assessment/Plan:   Diabetes: - Currently uncontrolled - Reviewed long term cardiovascular and renal outcomes of uncontrolled blood sugar - Reviewed goal A1c, goal fasting, and goal 2 hour post prandial glucose - Discussed that delayed gastric emptying is most pronounced in first few weeks of increasing dose of a GLP1. Reviewed importance of fluids, fiber, physical activity to help with constipation. Patient verbalized understanding. She notes Dr. Shawnee Knapp wanted to increase Ozempic to 1 mg when constipation was more manageable. Encouraged patient to communicate with Dr. Shawnee Knapp, as I would recommend dose increase when able. Could alternatively consider Mounjaro - most Medicaid plans will cover with trial and failure of 2+ preferred agents (patient has tried Victoza, Trulicity, and now Ozempic). She will communicate with endocrinology.  Hypertension: - Currently uncontrolled - Reviewed long term cardiovascular and renal outcomes of uncontrolled blood pressure - Reviewed appropriate blood pressure monitoring technique and reviewed goal blood pressure. Recommended to check home blood pressure and heart rate periodically - Recommend to continue current regimen. Agree with PCP to consider dose increase of valsartan if BP remains >130/80  Hyperlipidemia/ASCVD Risk Reduction: - Currently uncontrolled, due for updated lipid panel - Recommend to continue current regimen  Follow Up Plan: phone call in ~ 6 weeks  [name], PharmD, [facility] [phone]

## 2022-02-23 NOTE — Patient Instructions (Addendum)
Dynasia,   It was great talking with you!  I agree with Dr. Shawnee Knapp - increasing the Ozempic dose moving forward would be preferable to improve your sugar control, if tolerated. I encouraged you to focus on staying hydrated, incorporating fibers into your diet (fruits and vegetables, whole grains, even fiber supplements like Metamucil if needed) to help with the constipation.  You could also see how he feels about Mounjaro. It causes the delayed gastric emptying/constipation as well, but is generally more effective than Ozempic so we may see better glucose lowering with lower doses, so possibly less constipation. Medicaid generally covers it if you have tried multiple "preferred" options, and you have - Victoza, Trulicity, and Ozempic now.   Check your blood pressure twice weekly, and any time you have concerning symptoms like headache, chest pain, dizziness, shortness of breath, or vision changes.   Our goal is less than 130/80.  To appropriately check your blood pressure, make sure you do the following:  1) Avoid caffeine, exercise, or tobacco products for 30 minutes before checking. Empty your bladder. 2) Sit with your back supported in a flat-backed chair. Rest your arm on something flat (arm of the chair, table, etc). 3) Sit still with your feet flat on the floor, resting, for at least 5 minutes.  4) Check your blood pressure. Take 1-2 readings.  5) Write down these readings and bring with you to any provider appointments.  Bring your home blood pressure machine with you to a provider's office for accuracy comparison at least once a year.   Make sure you take your blood pressure medications before you come to any office visit, even if you were asked to fast for labs.  Take care!  [name], PharmD, [facility] [phone]

## 2022-02-24 ENCOUNTER — Encounter (INDEPENDENT_AMBULATORY_CARE_PROVIDER_SITE_OTHER): Payer: Self-pay | Admitting: Family Medicine

## 2022-02-24 ENCOUNTER — Ambulatory Visit (INDEPENDENT_AMBULATORY_CARE_PROVIDER_SITE_OTHER): Payer: Medicaid Other | Admitting: Family Medicine

## 2022-02-24 VITALS — BP 138/83 | HR 85 | Temp 98.0°F | Ht 64.0 in | Wt 228.0 lb

## 2022-02-24 DIAGNOSIS — Z7985 Long-term (current) use of injectable non-insulin antidiabetic drugs: Secondary | ICD-10-CM

## 2022-02-24 DIAGNOSIS — E669 Obesity, unspecified: Secondary | ICD-10-CM | POA: Diagnosis not present

## 2022-02-24 DIAGNOSIS — E559 Vitamin D deficiency, unspecified: Secondary | ICD-10-CM

## 2022-02-24 DIAGNOSIS — Z6841 Body Mass Index (BMI) 40.0 and over, adult: Secondary | ICD-10-CM

## 2022-02-24 DIAGNOSIS — E1169 Type 2 diabetes mellitus with other specified complication: Secondary | ICD-10-CM

## 2022-02-24 MED ORDER — VITAMIN D (ERGOCALCIFEROL) 1.25 MG (50000 UNIT) PO CAPS: 50000.0000 [IU] | ORAL_CAPSULE | ORAL | 0 refills | Status: DC

## 2022-03-05 NOTE — Progress Notes (Signed)
Chief Complaint:   OBESITY Lindsey Bowen is here to discuss her progress with her obesity treatment plan along with follow-up of her obesity related diagnoses. Lindsey Bowen is on practicing portion control and making smarter food choices, such as increasing vegetables and decreasing simple carbohydrates and states she is following her eating plan approximately 60-70% of the time. Lindsey Bowen states she is not currently exercising.  Today's visit was #: 7 Starting weight: 247 lbs Starting date: 09/23/2021 Today's weight: 238 lbs Today's date: 02/24/2022 Total lbs lost to date: 9 Total lbs lost since last in-office visit: 7  Interim History: [name] lost 7 lbs in over 3.5 months since she was last seen in the office. Congratulated pt on progress, but with recent A1c of 9.3, we also discussed with pt, her need to follow the meal plan more closely.  Subjective:   1. Type 2 diabetes mellitus with other specified complication, without long-term current use of insulin (HCC) Pt has been on Ozempic 0.5 mg for approximately 1.5 months and tolerating it well. She does get a little GI upset now and declines need for increase in dose. Her A1c was 9.3 on 02/04/2022.  2. Vitamin D deficiency She is currently taking prescription vitamin D 50,000 IU each week. She denies nausea, vomiting or muscle weakness.  Assessment/Plan:  No orders of the defined types were placed in this encounter.   Medications Discontinued During This Encounter  Medication Reason   Vitamin D, Ergocalciferol, (DRISDOL) 1.25 MG (50000 UNIT) CAPS capsule Reorder     Meds ordered this encounter  Medications   Vitamin D, Ergocalciferol, (DRISDOL) 1.25 MG (50000 UNIT) CAPS capsule    Sig: Take 1 capsule (50,000 Units total) by mouth every 7 (seven) days.    Dispense:  4 capsule    Refill:  0     1. Type 2 diabetes mellitus with other specified complication, without long-term current use of insulin (HCC) Good blood sugar control is  important to decrease the likelihood of diabetic complications such as nephropathy, neuropathy, limb loss, blindness, coronary artery disease, and death. Intensive lifestyle modification including diet, exercise and weight loss are the first line of treatment for diabetes.  Counseling done. Treatment per Dr. Shawnee Knapp at endocrinology. Decrease simple carbs and increase lean proteins.  2. Vitamin D deficiency Low Vitamin D level contributes to fatigue and are associated with obesity, breast, and colon cancer. She agrees to continue to take prescription Vitamin D @50 ,000 IU every week and will follow-up for routine testing of Vitamin D, at least 2-3 times per year to avoid over-replacement.  Refill- Vitamin D, Ergocalciferol, (DRISDOL) 1.25 MG (50000 UNIT) CAPS capsule; Take 1 capsule (50,000 Units total) by mouth every 7 (seven) days.  Dispense: 4 capsule; Refill: 0  3. Obesity, Current BMI 41 Lindsey Bowen is currently in the action stage of change. As such, her goal is to continue with weight loss efforts. She has agreed to practicing portion control and making smarter food choices, such as increasing vegetables and decreasing simple carbohydrates.   Track portions of fruits, vegetables, and proteins each day.  Exercise goals: All adults should avoid inactivity. Some physical activity is better than none, and adults who participate in any amount of physical activity gain some health benefits.  Behavioral modification strategies: meal planning and cooking strategies and keeping healthy foods in the home.  Lindsey Bowen has agreed to follow-up with our clinic in 3 weeks with [name], FNP. She was informed of the importance of frequent follow-up visits to  maximize her success with intensive lifestyle modifications for her multiple health conditions.   Objective:   Blood pressure 138/83, pulse 85, temperature 98 F (36.7 C), height 5\' 4"  (1.626 m), SpO2 98 %. Body mass index is 40.85 kg/m.  General: Cooperative,  alert, well developed, in no acute distress. HEENT: Conjunctivae and lids unremarkable. Cardiovascular: Regular rhythm.  Lungs: Normal work of breathing. Neurologic: No focal deficits.   Lab Results  Component Value Date   CREATININE 0.90 11/11/2021   BUN 9 11/11/2021   NA 142 11/11/2021   K 3.7 11/11/2021   CL 102 11/11/2021   CO2 27 11/11/2021   Lab Results  Component Value Date   ALT 31 11/11/2021   AST 21 11/11/2021   ALKPHOS 78 11/11/2021   BILITOT 0.3 11/11/2021   Lab Results  Component Value Date   HGBA1C 8.8 (H) 09/23/2021   HGBA1C 9.8 (A) 08/13/2021   HGBA1C 9.9 (H) 05/27/2017   No results found for: "INSULIN" Lab Results  Component Value Date   TSH 4.200 09/23/2021   Lab Results  Component Value Date   CHOL 173 09/23/2021   HDL 47 09/23/2021   LDLCALC 109 (H) 09/23/2021   TRIG 95 09/23/2021   CHOLHDL 5.0 (H) 10/20/2020   Lab Results  Component Value Date   VD25OH 13.8 (L) 09/23/2021   VD25OH 19.1 (L) 03/28/2020   Lab Results  Component Value Date   WBC 6.8 09/23/2021   HGB 12.3 09/23/2021   HCT 39.1 09/23/2021   MCV 72 (L) 09/23/2021   PLT 305 09/23/2021   Lab Results  Component Value Date   IRON 52 01/29/2021   FERRITIN 59.7 01/29/2021   FERRITIN 59.7 01/29/2021    Attestation Statements:   Reviewed by clinician on day of visit: allergies, medications, problem list, medical history, surgical history, family history, social history, and previous encounter notes.  I, 03/31/2021, BS, CMA, am acting as transcriptionist for Kyung Rudd, DO.   I have reviewed the above documentation for accuracy and completeness, and I agree with the above. [employer], D.O.  The 21st Century Cures Act was signed into law in 2016 which includes the topic of electronic health records.  This provides immediate access to information in MyChart.  This includes consultation notes, operative notes, office notes, lab results and pathology reports.  If  you have any questions about what you read please let 2017 know at your next visit so we can discuss your concerns and take corrective action if need be.  We are right here with you.

## 2022-03-12 ENCOUNTER — Ambulatory Visit: Payer: Medicaid Other | Admitting: Podiatry

## 2022-03-16 NOTE — Progress Notes (Deleted)
TeleHealth Visit:  This visit was completed with telemedicine (audio/video) technology. [name] has verbally consented to this TeleHealth visit. The patient is located at home, the provider is located at home. The participants in this visit include the listed provider and patient. The visit was conducted today via MyChart video.  OBESITY Lindsey Bowen is here to discuss her progress with her obesity treatment plan along with follow-up of her obesity related diagnoses.   Today's visit was # 8 Starting weight: 247 lbs Starting date: 09/23/2021 Weight at last in office visit: 238 lbs on 02/24/22 Total weight loss: 9 lbs at last in office visit on 02/24/22. Today's reported weight: *** lbs No weight reported.  Nutrition Plan: practicing portion control and making smarter food choices, such as increasing vegetables and decreasing simple carbohydrates.  Hunger is {EWCONTROLASSESSMENT:24261}. Cravings are {EWCONTROLASSESSMENT:24261}.  Current exercise: {exercise types:16438}  Interim History: ***  Assessment/Plan:  1. Type II Diabetes HgbA1c {ACTION; IS/IS NOT:21021397} at goal. Last A1c was *** CBGs: Fasting ***, 2 hour PP *** Not checking Episodes of hypoglycemia: {yes***/no:17258} Medication(s): Regular insulin TID- 65 units breakfast, 55 units lunch and dinner, Ozempic 0.5 mg weekly  Lab Results  Component Value Date   HGBA1C 8.8 (H) 09/23/2021   HGBA1C 9.8 (A) 08/13/2021   HGBA1C 9.9 (H) 05/27/2017   Lab Results  Component Value Date   LDLCALC 109 (H) 09/23/2021   CREATININE 0.90 11/11/2021    Plan: ***   2. ***  3. ***  Obesity: Current BMI *** [name] {CHL AMB IS/IS NOT:210130109} currently in the action stage of change. As such, her goal is to {MWMwtloss#1:210800005}.  She has agreed to {MWMwtlossportion/plan2:23431}.   Exercise goals: {MWM EXERCISE RECS:23473}  Behavioral modification strategies: {MWMwtlossdietstrategies3:23432}.  [name] has agreed to  follow-up with our clinic in {NUMBER 1-10:22536} weeks.   No orders of the defined types were placed in this encounter.   There are no discontinued medications.   No orders of the defined types were placed in this encounter.     Objective:   VITALS: Per patient if applicable, see vitals. GENERAL: Alert and in no acute distress. CARDIOPULMONARY: No increased WOB. Speaking in clear sentences.  PSYCH: Pleasant and cooperative. Speech normal rate and rhythm. Affect is appropriate. Insight and judgement are appropriate. Attention is focused, linear, and appropriate.  NEURO: Oriented as arrived to appointment on time with no prompting.   Lab Results  Component Value Date   CREATININE 0.90 11/11/2021   BUN 9 11/11/2021   NA 142 11/11/2021   K 3.7 11/11/2021   CL 102 11/11/2021   CO2 27 11/11/2021   Lab Results  Component Value Date   ALT 31 11/11/2021   AST 21 11/11/2021   ALKPHOS 78 11/11/2021   BILITOT 0.3 11/11/2021   Lab Results  Component Value Date   HGBA1C 8.8 (H) 09/23/2021   HGBA1C 9.8 (A) 08/13/2021   HGBA1C 9.9 (H) 05/27/2017   No results found for: "INSULIN" Lab Results  Component Value Date   TSH 4.200 09/23/2021   Lab Results  Component Value Date   CHOL 173 09/23/2021   HDL 47 09/23/2021   LDLCALC 109 (H) 09/23/2021   TRIG 95 09/23/2021   CHOLHDL 5.0 (H) 10/20/2020   Lab Results  Component Value Date   WBC 6.8 09/23/2021   HGB 12.3 09/23/2021   HCT 39.1 09/23/2021   MCV 72 (L) 09/23/2021   PLT 305 09/23/2021   Lab Results  Component Value Date   IRON 52 01/29/2021  FERRITIN 59.7 01/29/2021   FERRITIN 59.7 01/29/2021   Lab Results  Component Value Date   VD25OH 13.8 (L) 09/23/2021   VD25OH 19.1 (L) 03/28/2020    Attestation Statements:   Reviewed by clinician on day of visit: allergies, medications, problem list, medical history, surgical history, family history, social history, and previous encounter notes.  ***(delete if  time-based billing not used) Time spent on visit including the items listed below was *** minutes.  -preparing to see the patient (e.g., review of tests, history, previous notes) -obtaining and/or reviewing separately obtained history -counseling and educating the patient/family/caregiver -documenting clinical information in the electronic or other health record -ordering medications, tests, or procedures -independently interpreting results and communicating results to the patient/ family/caregiver -referring and communicating with other health care professionals  -care coordination

## 2022-03-17 ENCOUNTER — Telehealth (INDEPENDENT_AMBULATORY_CARE_PROVIDER_SITE_OTHER): Payer: Self-pay | Admitting: Family Medicine

## 2022-03-17 ENCOUNTER — Telehealth (INDEPENDENT_AMBULATORY_CARE_PROVIDER_SITE_OTHER): Payer: Medicaid Other | Admitting: Family Medicine

## 2022-03-17 NOTE — Telephone Encounter (Signed)
Patient was a no-show for virtual visit. Left VM advising patient to call to reschedule.  

## 2022-03-18 DIAGNOSIS — Z79899 Other long term (current) drug therapy: Secondary | ICD-10-CM | POA: Diagnosis not present

## 2022-03-19 LAB — CBC WITH DIFFERENTIAL/PLATELET
Basophils Absolute: 0 10*3/uL (ref 0.0–0.2)
Basos: 1 %
EOS (ABSOLUTE): 0.1 10*3/uL (ref 0.0–0.4)
Eos: 2 %
Hematocrit: 38.1 % (ref 34.0–46.6)
Hemoglobin: 12 g/dL (ref 11.1–15.9)
Immature Grans (Abs): 0 10*3/uL (ref 0.0–0.1)
Immature Granulocytes: 0 %
Lymphocytes Absolute: 2.4 10*3/uL (ref 0.7–3.1)
Lymphs: 40 %
MCH: 22.5 pg — ABNORMAL LOW (ref 26.6–33.0)
MCHC: 31.5 g/dL (ref 31.5–35.7)
MCV: 72 fL — ABNORMAL LOW (ref 79–97)
Monocytes Absolute: 0.3 10*3/uL (ref 0.1–0.9)
Monocytes: 5 %
Neutrophils Absolute: 3.2 10*3/uL (ref 1.4–7.0)
Neutrophils: 52 %
Platelets: 273 10*3/uL (ref 150–450)
RBC: 5.33 x10E6/uL — ABNORMAL HIGH (ref 3.77–5.28)
RDW: 13.7 % (ref 11.7–15.4)
WBC: 6.1 10*3/uL (ref 3.4–10.8)

## 2022-03-19 LAB — HEPATIC FUNCTION PANEL
ALT: 18 IU/L (ref 0–32)
AST: 13 IU/L (ref 0–40)
Albumin: 4.4 g/dL (ref 3.9–4.9)
Alkaline Phosphatase: 88 IU/L (ref 44–121)
Bilirubin Total: <0.2 mg/dL (ref 0.0–1.2)
Bilirubin, Direct: <0.1 mg/dL (ref 0.00–0.40)
Total Protein: 6.9 g/dL (ref 6.0–8.5)

## 2022-03-22 ENCOUNTER — Other Ambulatory Visit: Payer: Medicaid Other | Admitting: Pharmacist

## 2022-03-24 ENCOUNTER — Encounter: Payer: Self-pay | Admitting: Nurse Practitioner

## 2022-03-24 ENCOUNTER — Encounter: Payer: Self-pay | Admitting: Podiatry

## 2022-03-26 ENCOUNTER — Other Ambulatory Visit: Payer: Self-pay | Admitting: Podiatry

## 2022-03-26 MED ORDER — GENTAMICIN SULFATE 0.1 % EX OINT: 1.0000 | TOPICAL_OINTMENT | Freq: Three times a day (TID) | CUTANEOUS | 0 refills | Status: DC

## 2022-03-29 ENCOUNTER — Other Ambulatory Visit: Payer: Medicaid Other | Admitting: Pharmacist

## 2022-03-29 NOTE — Patient Instructions (Signed)
Lindsey Bowen,   It was great talking to you today.   Consider asking Dr. Hartford Poli to increase the Ozempic dose to 1 mg weekly. If he does, ask if he wants you to reduce your doses of Humulin to reduce your risk of hypoglycemia.   Check your blood pressure periodically, and any time you have concerning symptoms like headache, chest pain, dizziness, shortness of breath, or vision changes.   Our goal is less than 130/80.  To appropriately check your blood pressure, make sure you do the following:  1) Avoid caffeine, exercise, or tobacco products for 30 minutes before checking. Empty your bladder. 2) Sit with your back supported in a flat-backed chair. Rest your arm on something flat (arm of the chair, table, etc). 3) Sit still with your feet flat on the floor, resting, for at least 5 minutes.  4) Check your blood pressure. Take 1-2 readings.  5) Write down these readings and bring with you to any provider appointments.  Bring your home blood pressure machine with you to a provider's office for accuracy comparison at least once a year.   Make sure you take your blood pressure medications before you come to any office visit, even if you were asked to fast for labs.  Take care!  Catie Hedwig Morton, PharmD, [facility] [phone]

## 2022-03-29 NOTE — Progress Notes (Signed)
Chief Complaint  Patient presents with   Diabetes   Hypertension   Hyperlipidemia   Medication Management    Lindsey Bowen is a 51 y.o. year old female who presented for a telephone visit.   They were referred to the pharmacist by their Case Management Team  for assistance in managing complex medication management.   Patient is participating in a Managed Medicaid Plan:  Yes  Subjective:  Care Team: Primary Care Provider: Claiborne Rigg, NP ; Next Scheduled Visit: not scheduled Endocrinologist Shawnee Knapp; Next Scheduled Visit: 04/2022  Medication Access/Adherence  Current Pharmacy:  [facility] - [place], [place] - [address] [place] [address] Phone: [phone] Fax: [phone]   Patient reports affordability concerns with their medications: No  Patient reports access/transportation concerns to their pharmacy: No  Patient reports adherence concerns with their medications:  No     Diabetes:  Current medications:  Medications tried in the past:   Still reports some constipation, and difficult to find a routine of laxatives based on her rotating schedule driving. Reports fasting readings remain relatively well controlled in ~ 100s, but occasionally elevations after meals. Notes she also has some stomach upset if she eats too much.  Patient denies hypoglycemic s/sx including dizziness, shakiness, sweating.  Hypertension:  Current medications: valsartan 80 mg daily  Patient has a validated, automated, upper arm home BP cuff but has not been checking lately  Hyperlipidemia/ASCVD Risk Reduction  Current lipid lowering medications: rosuvastatin 20 mg daily    Health Maintenance  Health Maintenance Due  Topic Date Due   COVID-19 Vaccine (1) Never done   OPHTHALMOLOGY EXAM  Never done   Diabetic kidney evaluation - Urine ACR  12/31/2021   HEMOGLOBIN A1C  03/26/2022     Objective: Lab Results  Component Value  Date   HGBA1C 8.8 (H) 09/23/2021    Lab Results  Component Value Date   CREATININE 0.90 11/11/2021   BUN 9 11/11/2021   NA 142 11/11/2021   K 3.7 11/11/2021   CL 102 11/11/2021   CO2 27 11/11/2021    Lab Results  Component Value Date   CHOL 173 09/23/2021   HDL 47 09/23/2021   LDLCALC 109 (H) 09/23/2021   TRIG 95 09/23/2021   CHOLHDL 5.0 (H) 10/20/2020    Medications Reviewed Today     Reviewed by Mardi Mainland, CMA (Certified Medical Assistant) on 02/24/22 at [phone]  Med List Status: <None>   Medication Order Taking? Sig Documenting Provider Last Dose Status Informant  acetaminophen (TYLENOL) 650 MG CR tablet [id number] Yes Take 650 mg by mouth every 8 (eight) hours as needed for pain. [provider] Taking Active Self  BD PEN NEEDLE NANO 2ND GEN 32G X 4 MM MISC [id number] Yes ONE EACH BY OTHER ROUTE DAILY. [provider] Taking Active   Blood Pressure Monitor DEVI [id number] Yes Please provide patient with insurance approved blood pressure monitor ICD I 10.0 Meredeth Ide, Zelda W, NP Taking Active   EPINEPHrine 0.3 mg/0.3 mL IJ SOAJ injection [id number] Yes Inject 0.3 mg into the muscle as needed for anaphylaxis. Claiborne Rigg, NP Taking Active            Med Note [facility], Bary Castilla   Wed Sep 23, 2021 10:29 AM)    fluticasone Aleda Grana) 50 MCG/ACT nasal spray [id number] Yes Place 2 sprays into both nostrils daily as needed for allergies or rhinitis. [provider] Taking Active   ibuprofen (  ADVIL) 200 MG tablet [id number] Yes Take 400 mg by mouth every 6 (six) hours as needed. [provider] Taking Active   insulin regular human CONCENTRATED (HUMULIN R U-500 KWIKPEN) 500 UNIT/ML KwikPen [id number] Yes Inject 65 units under the skin before breakfast and 45 units before lunch and dinner.  Patient taking differently: Inject 65 units under the skin before breakfast and 55 units before lunch and dinner.   [place], Joneen Boers, FNP Taking Active    levocetirizine (XYZAL) 5 MG tablet [id number] Yes Take 5 mg by mouth daily as needed. [provider] Taking Active   levonorgestrel (LILETTA, 52 MG,) 20.1 MCG/DAY IUD [id number] Yes 1 each by Intrauterine route once. [provider] Taking Active   levothyroxine (SYNTHROID) 175 MCG tablet [id number] Yes Take 175 mcg by mouth daily before breakfast. Take 1 tablet by mouth 6 days per week and 1/2 tablet on Sundays [provider] Taking Active   linaclotide Rolan Lipa) 72 MCG capsule [id number] Yes Take 1 capsule (72 mcg total) by mouth daily before breakfast. Gildardo Pounds, NP Taking Active   methocarbamol (ROBAXIN) 500 MG tablet [id number] Yes Take 500 mg by mouth daily as needed for muscle spasms. [provider] Taking Active   OZEMPIC, 0.25 OR 0.5 MG/DOSE, 2 MG/3ML SOPN [id number] Yes Inject 0.5 mg into the skin once a week. [provider] Taking Active   PROAIR HFA 108 [id number] Base) MCG/ACT inhaler [id number] Yes INHALE 1-2 PUFFS BY MOUTH EVERY 6 HOURS AS NEEDED FOR WHEEZE OR SHORTNESS OF Payton Spark, NP Taking Active   rosuvastatin (CRESTOR) 20 MG tablet [id number] Yes Take 1 tablet (20 mg total) by mouth at bedtime. Gildardo Pounds, NP Taking Active   sennosides-docusate sodium (SENOKOT-S) 8.6-50 MG tablet [id number] Yes Take 2 tablets by mouth daily. Gildardo Pounds, NP Taking Active   terbinafine (LAMISIL) 250 MG tablet [id number] Yes Take 1 tablet (250 mg total) by mouth daily. Trula Slade, DPM Taking Active   valsartan (DIOVAN) 80 MG tablet [id number] Yes Take 80 mg by mouth daily. [provider] Taking Active   Vitamin D, Ergocalciferol, (DRISDOL) 1.25 MG (50000 UNIT) CAPS capsule [id number] Yes Take 1 capsule (50,000 Units total) by mouth every 7 (seven) days. Gildardo Pounds, NP Taking Active               Assessment/Plan:   Diabetes: - Currently uncontrolled - Reviewed dietary modifications including: moderating  fatty/fried foods, portions sizes. Discussed smaller, more frequent meals, fiber, hydration to help with constipation.  - Patient is going to think about asking Dr. Hartford Poli to try increasing the dose. We discussed that constipation could worsen, but dose can be adjusted back to 0.5 mg if intolerable GI upset.    Hypertension: - Currently uncontrolled at goal <130/80 - Reviewed long term cardiovascular and renal outcomes of uncontrolled blood pressure - Reviewed appropriate blood pressure monitoring technique and reviewed goal blood pressure. Recommended to check home blood pressure and heart rate periodically - Recommend to continue current regimen at this time   Hyperlipidemia/ASCVD Risk Reduction: - Currently moderately well controlled, though could consider goal LDL <70.  - Recommend to continue current regimen at this time   Follow Up Plan: phone call in 8 weeks  Catie Hedwig Morton, PharmD, [facility] [phone]

## 2022-04-07 ENCOUNTER — Encounter (INDEPENDENT_AMBULATORY_CARE_PROVIDER_SITE_OTHER): Payer: Self-pay | Admitting: Family Medicine

## 2022-04-07 ENCOUNTER — Ambulatory Visit (INDEPENDENT_AMBULATORY_CARE_PROVIDER_SITE_OTHER): Payer: Medicaid Other | Admitting: Family Medicine

## 2022-04-07 VITALS — BP 138/86 | HR 76 | Temp 98.1°F | Ht 64.0 in | Wt 237.0 lb

## 2022-04-07 DIAGNOSIS — E1169 Type 2 diabetes mellitus with other specified complication: Secondary | ICD-10-CM | POA: Diagnosis not present

## 2022-04-07 DIAGNOSIS — E559 Vitamin D deficiency, unspecified: Secondary | ICD-10-CM | POA: Diagnosis not present

## 2022-04-07 DIAGNOSIS — Z794 Long term (current) use of insulin: Secondary | ICD-10-CM

## 2022-04-07 DIAGNOSIS — E66813 Obesity, class 3: Secondary | ICD-10-CM

## 2022-04-07 DIAGNOSIS — Z6841 Body Mass Index (BMI) 40.0 and over, adult: Secondary | ICD-10-CM

## 2022-04-07 DIAGNOSIS — E669 Obesity, unspecified: Secondary | ICD-10-CM | POA: Diagnosis not present

## 2022-04-07 MED ORDER — VITAMIN D (ERGOCALCIFEROL) 1.25 MG (50000 UNIT) PO CAPS: 50000.0000 [IU] | ORAL_CAPSULE | ORAL | 0 refills | Status: DC

## 2022-04-12 ENCOUNTER — Ambulatory Visit: Payer: Medicaid Other | Admitting: Podiatry

## 2022-04-17 NOTE — Progress Notes (Unsigned)
Chief Complaint:   OBESITY Lindsey Bowen is here to discuss her progress with her obesity treatment plan along with follow-up of her obesity related diagnoses. Lindsey Bowen is on practicing portion control and making smarter food choices, such as increasing vegetables and decreasing simple carbohydrates and states she is following her eating plan approximately 20% of the time. Lindsey Bowen states she is not currently exercising.  Today's visit was #: 8 Starting weight: 247 lbs Starting date: 09/23/2021 Today's weight: 237 lbs Today's date: 04/07/2022 Total lbs lost to date: 10 Total lbs lost since last in-office visit: 1  Interim History: Lindsey Bowen is having challenges with meal prep. She is not measuring or calculating portions.  Subjective:   1. Type 2 diabetes mellitus with obesity ([place]) Harriett's last A1c was 9.3. Pt has no symptoms or concerns.  2. Vitamin D deficiency She is currently taking prescription vitamin D 50,000 IU each week. She denies nausea, vomiting or muscle weakness. Pt just took last dose recently.  Assessment/Plan:  No orders of the defined types were placed in this encounter.   Medications Discontinued During This Encounter  Medication Reason   Vitamin D, Ergocalciferol, (DRISDOL) 1.25 MG (50000 UNIT) CAPS capsule Reorder     Meds ordered this encounter  Medications   Vitamin D, Ergocalciferol, (DRISDOL) 1.25 MG (50000 UNIT) CAPS capsule    Sig: Take 1 capsule (50,000 Units total) by mouth every 7 (seven) days.    Dispense:  4 capsule    Refill:  0     1. Type 2 diabetes mellitus with obesity (HCC) Good blood sugar control is important to decrease the likelihood of diabetic complications such as nephropathy, neuropathy, limb loss, blindness, coronary artery disease, and death. Intensive lifestyle modification including diet, exercise and weight loss are the first line of treatment for diabetes.  Pt states she wants her endocrinologist to do all medication  management (GLP-1's, etc). Per pt, endocrinologist was concerned with only him managing the meds.  2. Vitamin D deficiency Low Vitamin D level contributes to fatigue and are associated with obesity, breast, and colon cancer. She agrees to continue to take prescription Vitamin D @50 ,000 IU every week and will follow-up for routine testing of Vitamin D, at least 2-3 times per year to avoid over-replacement.  Refill- Vitamin D, Ergocalciferol, (DRISDOL) 1.25 MG (50000 UNIT) CAPS capsule; Take 1 capsule (50,000 Units total) by mouth every 7 (seven) days.  Dispense: 4 capsule; Refill: 0  3. Obesity, Current BMI 40.66 Lindsey Bowen is currently in the action stage of change. As such, her goal is to continue with weight loss efforts. She has agreed to practicing portion control and making smarter food choices, such as increasing vegetables and decreasing simple carbohydrates.   Pt's goal for next OV is to walk 2 days a week for 15+ minutes.  Exercise goals:  As is, but try to walk some.  Behavioral modification strategies: increasing lean protein intake, increasing water intake, and planning for success.  Lindsey Bowen has agreed to follow-up with our clinic in 3-4 weeks with NP Dawn. She was informed of the importance of frequent follow-up visits to maximize her success with intensive lifestyle modifications for her multiple health conditions.   Objective:   Blood pressure 138/86, pulse 76, temperature 98.1 F (36.7 C), height 5\' 4"  (1.626 m), weight 237 lb (107.5 kg), SpO2 97 %. Body mass index is 40.68 kg/m.  General: Cooperative, alert, well developed, in no acute distress. HEENT: Conjunctivae and lids unremarkable. Cardiovascular: Regular rhythm.  Lungs: Normal work of breathing. Neurologic: No focal deficits.   Lab Results  Component Value Date   CREATININE 0.90 11/11/2021   BUN 9 11/11/2021   NA 142 11/11/2021   K 3.7 11/11/2021   CL 102 11/11/2021   CO2 27 11/11/2021   Lab Results   Component Value Date   ALT 18 03/18/2022   AST 13 03/18/2022   ALKPHOS 88 03/18/2022   BILITOT <0.2 03/18/2022   Lab Results  Component Value Date   HGBA1C 8.8 (H) 09/23/2021   HGBA1C 9.8 (A) 08/13/2021   HGBA1C 9.9 (H) 05/27/2017   No results found for: "INSULIN" Lab Results  Component Value Date   TSH 4.200 09/23/2021   Lab Results  Component Value Date   CHOL 173 09/23/2021   HDL 47 09/23/2021   LDLCALC 109 (H) 09/23/2021   TRIG 95 09/23/2021   CHOLHDL 5.0 (H) 10/20/2020   Lab Results  Component Value Date   VD25OH 13.8 (L) 09/23/2021   VD25OH 19.1 (L) 03/28/2020   Lab Results  Component Value Date   WBC 6.1 03/18/2022   HGB 12.0 03/18/2022   HCT 38.1 03/18/2022   MCV 72 (L) 03/18/2022   PLT 273 03/18/2022   Lab Results  Component Value Date   IRON 52 01/29/2021   FERRITIN 59.7 01/29/2021   FERRITIN 59.7 01/29/2021    Attestation Statements:   Reviewed by clinician on day of visit: allergies, medications, problem list, medical history, surgical history, family history, social history, and previous encounter notes.  I, Kathlene November, BS, CMA, am acting as transcriptionist for [employer], DO.   I have reviewed the above documentation for accuracy and completeness, and I agree with the above. Marjory Sneddon, D.O.  The [place] was signed into law in 2016 which includes the topic of electronic health records.  This provides immediate access to information in MyChart.  This includes consultation notes, operative notes, office notes, lab results and pathology reports.  If you have any questions about what you read please let us know at your next visit so we can discuss your concerns and take corrective action if need be.  We are right here with you.

## 2022-05-04 DIAGNOSIS — M545 Low back pain, unspecified: Secondary | ICD-10-CM | POA: Diagnosis not present

## 2022-05-05 NOTE — Progress Notes (Signed)
TeleHealth Visit:  This visit was completed with telemedicine (audio/video) technology. Lima has verbally consented to this TeleHealth visit. The patient is located at home, the provider is located at home. The participants in this visit include the listed provider and patient. The visit was conducted today via MyChart video.  OBESITY Lindsey Bowen is here to discuss her progress with her obesity treatment plan along with follow-up of her obesity related diagnoses.   Today's visit was # 9 Starting weight: 247 lbs Starting date: 09/23/2021 Weight at last in office visit: 237 lbs on 04/07/22 Total weight loss: 10 lbs at last in office visit on 04/07/22. Today's reported weight: *** lbs No weight reported.  Nutrition Plan: practicing portion control and making smarter food choices, such as increasing vegetables and decreasing simple carbohydrates.   Current exercise: {exercise types:16438} none  Interim History: ***  Assessment/Plan:  1. ***  2. ***  3. ***  Obesity: Current BMI *** [name] {CHL AMB IS/IS NOT:210130109} currently in the action stage of change. As such, her goal is to {MWMwtloss#1:210800005}.  She has agreed to {MWMwtlossportion/plan2:23431}.   Exercise goals: {MWM EXERCISE RECS:23473}  Behavioral modification strategies: {MWMwtlossdietstrategies3:23432}.  [name] has agreed to follow-up with our clinic in {NUMBER 1-10:22536} weeks.   No orders of the defined types were placed in this encounter.   There are no discontinued medications.   No orders of the defined types were placed in this encounter.     Objective:   VITALS: Per patient if applicable, see vitals. GENERAL: Alert and in no acute distress. CARDIOPULMONARY: No increased WOB. Speaking in clear sentences.  PSYCH: Pleasant and cooperative. Speech normal rate and rhythm. Affect is appropriate. Insight and judgement are appropriate. Attention is focused, linear, and appropriate.  NEURO:  Oriented as arrived to appointment on time with no prompting.   Lab Results  Component Value Date   CREATININE 0.90 11/11/2021   BUN 9 11/11/2021   NA 142 11/11/2021   K 3.7 11/11/2021   CL 102 11/11/2021   CO2 27 11/11/2021   Lab Results  Component Value Date   ALT 18 03/18/2022   AST 13 03/18/2022   ALKPHOS 88 03/18/2022   BILITOT <0.2 03/18/2022   Lab Results  Component Value Date   HGBA1C 8.8 (H) 09/23/2021   HGBA1C 9.8 (A) 08/13/2021   HGBA1C 9.9 (H) 05/27/2017   No results found for: "INSULIN" Lab Results  Component Value Date   TSH 4.200 09/23/2021   Lab Results  Component Value Date   CHOL 173 09/23/2021   HDL 47 09/23/2021   LDLCALC 109 (H) 09/23/2021   TRIG 95 09/23/2021   CHOLHDL 5.0 (H) 10/20/2020   Lab Results  Component Value Date   WBC 6.1 03/18/2022   HGB 12.0 03/18/2022   HCT 38.1 03/18/2022   MCV 72 (L) 03/18/2022   PLT 273 03/18/2022   Lab Results  Component Value Date   IRON 52 01/29/2021   FERRITIN 59.7 01/29/2021   FERRITIN 59.7 01/29/2021   Lab Results  Component Value Date   VD25OH 13.8 (L) 09/23/2021   VD25OH 19.1 (L) 03/28/2020    Attestation Statements:   Reviewed by clinician on day of visit: allergies, medications, problem list, medical history, surgical history, family history, social history, and previous encounter notes.  ***(delete if time-based billing not used) Time spent on visit including the items listed below was *** minutes.  -preparing to see the patient (e.g., review of tests, history, previous notes) -obtaining and/or reviewing separately obtained  history -counseling and educating the patient/family/caregiver -documenting clinical information in the electronic or other health record -ordering medications, tests, or procedures -independently interpreting results and communicating results to the patient/ family/caregiver -referring and communicating with other health care professionals  -care coordination

## 2022-05-06 ENCOUNTER — Encounter (INDEPENDENT_AMBULATORY_CARE_PROVIDER_SITE_OTHER): Payer: Self-pay | Admitting: Family Medicine

## 2022-05-06 ENCOUNTER — Telehealth (INDEPENDENT_AMBULATORY_CARE_PROVIDER_SITE_OTHER): Payer: Medicaid Other | Admitting: Family Medicine

## 2022-05-06 DIAGNOSIS — Z6839 Body mass index (BMI) 39.0-39.9, adult: Secondary | ICD-10-CM | POA: Diagnosis not present

## 2022-05-06 DIAGNOSIS — Z794 Long term (current) use of insulin: Secondary | ICD-10-CM | POA: Diagnosis not present

## 2022-05-06 DIAGNOSIS — E559 Vitamin D deficiency, unspecified: Secondary | ICD-10-CM | POA: Diagnosis not present

## 2022-05-06 DIAGNOSIS — E669 Obesity, unspecified: Secondary | ICD-10-CM

## 2022-05-06 DIAGNOSIS — E1129 Type 2 diabetes mellitus with other diabetic kidney complication: Secondary | ICD-10-CM | POA: Diagnosis not present

## 2022-05-08 IMAGING — MR MR ABDOMEN WO/W CM
11 of 19 series · 23 of 48 positions shown · IV contrast (20 ML MULTIHANCE)
Comparison: CT abdomen pelvis October 07, 2021

CLINICAL DATA: Follow-up hepatic abnormality seen on prior CT.

EXAM:
MRI ABDOMEN WITHOUT AND WITH CONTRAST
TECHNIQUE: Multiplanar multisequence MR imaging of the abdomen was performed
both before and after the administration of intravenous contrast.
CONTRAST:  20mL MULTIHANCE GADOBENATE DIMEGLUMINE 529 MG/ML IV SOLN

[Series 3: cor haste · coronal · 5.0mm · 0.88mm/px · 1 of 41 slices shown]
[im 1/41]
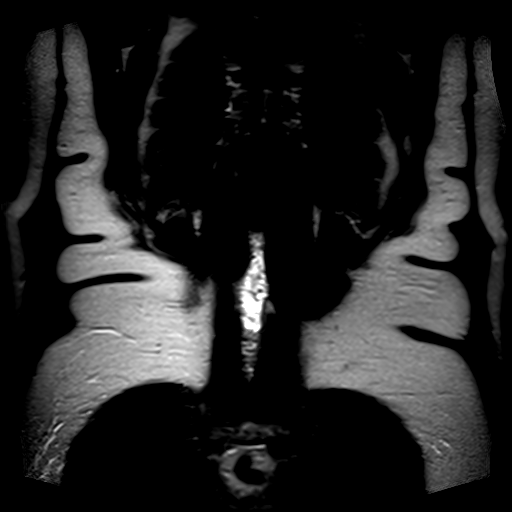

[Series 4: axial haste · axial · 6.0mm · 0.88mm/px · 1 of 44 slices shown]
[im 1/44]
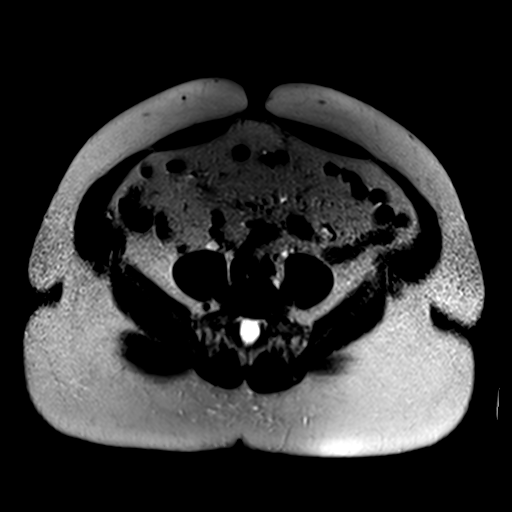

[Series 5: T1 · axial · 6.0mm · 0.88mm/px · z∈[-90,+194]mm · 3 of 88 slices shown]
[im 1/88]
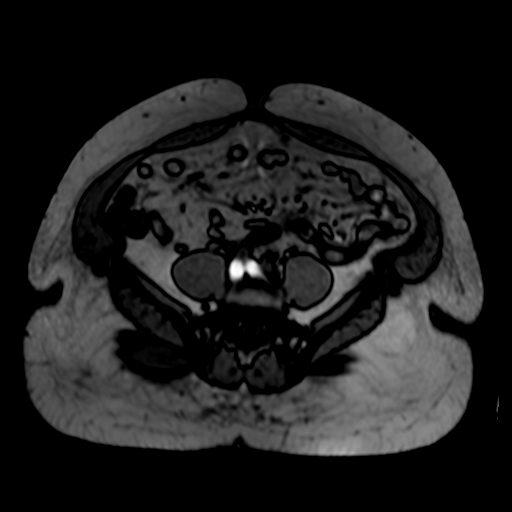
[im 44/88]
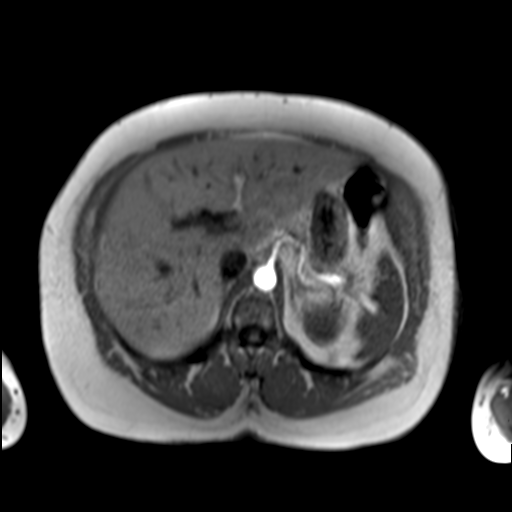
[im 88/88]
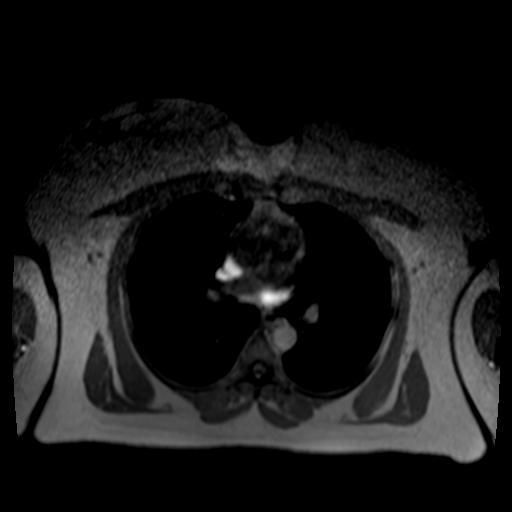

[Series 6: bSSFP · axial · 4.5mm · 0.88mm/px · z∈[-101,+196]mm · 2 of 67 slices shown]
[im 1/67]
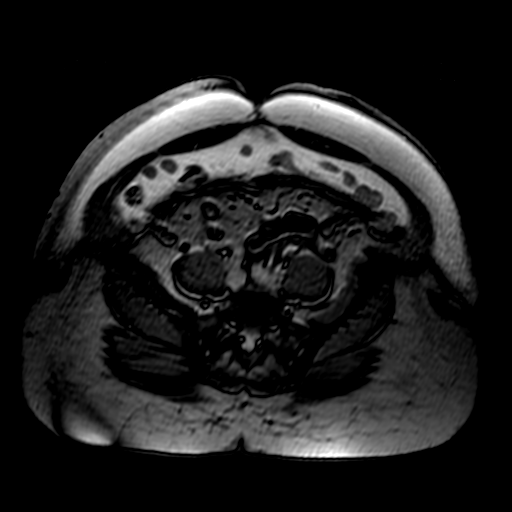
[im 67/67]
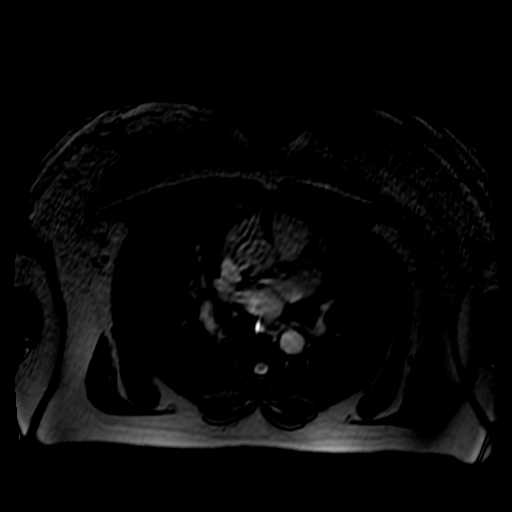

[Series 7: T2 fat-sat · axial · 6.0mm · 1.41mm/px · 1 of 47 slices shown]
[im 1/47]
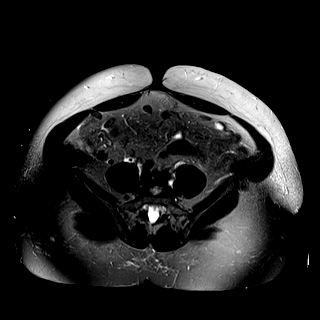

[Series 8: ep2d_diff_b50_500_800_p2_trig · axial · 6.0mm · 2.34mm/px · z∈[-90,+242]mm · 4 of 141 slices shown]
[im 1/141]
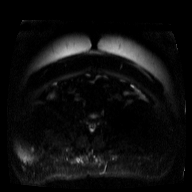
[im 47/141]
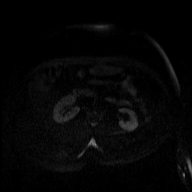
[im 94/141]
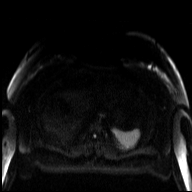
[im 141/141]
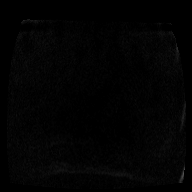

[Series 9: ep2d_diff_b50_500_800_p2_trig_adc · axial · 6.0mm · 2.34mm/px · 1 of 47 slices shown]
[im 1/47]
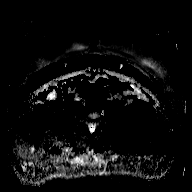

[Series 10: T1 dynamic · axial · non-contrast · 3.0mm · 0.88mm/px · z∈[-98,+187]mm · 3 of 96 slices shown]
[im 1/96]
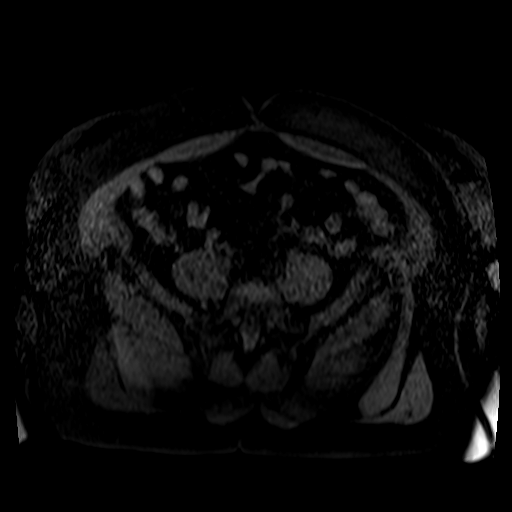
[im 48/96]
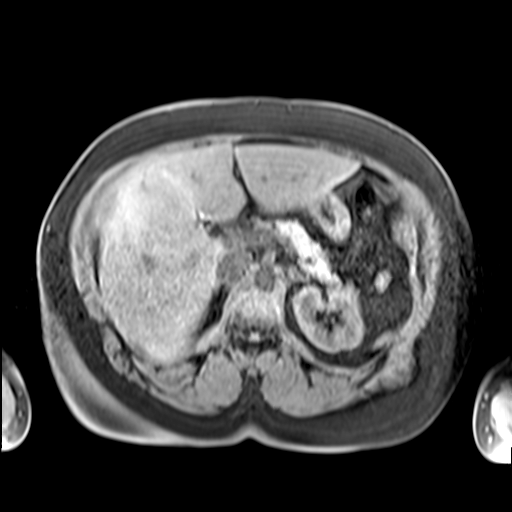
[im 96/96]
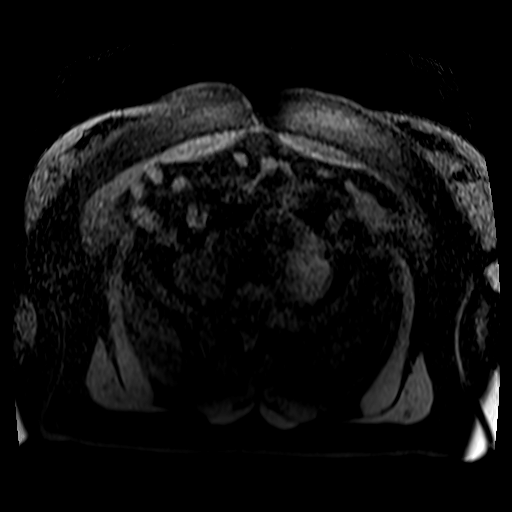

[Series 11: T1 dynamic post-contrast · axial · 3.0mm · 0.88mm/px · z∈[-98,+187]mm · 3 of 96 slices shown (1 of 3)]
[im 1/96]
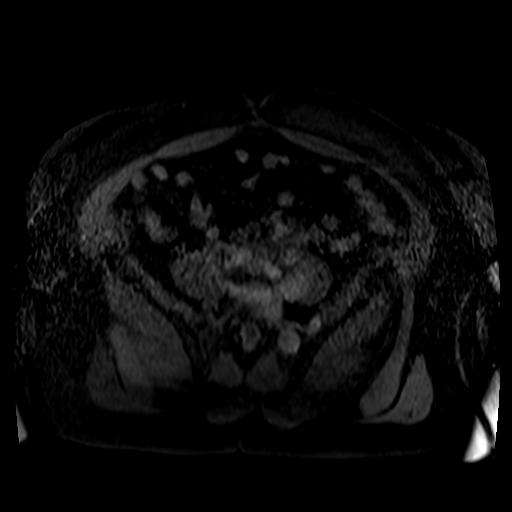
[im 48/96]
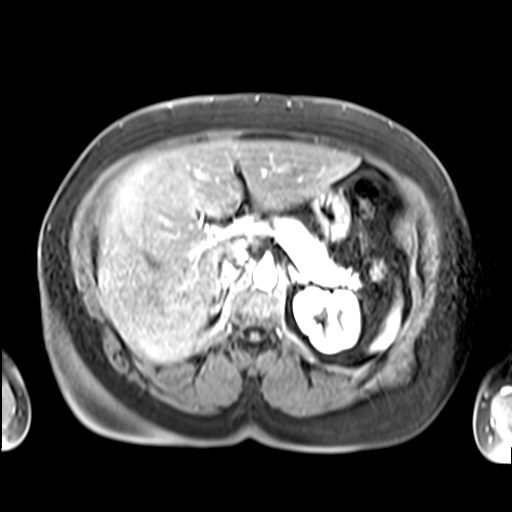
[im 96/96]
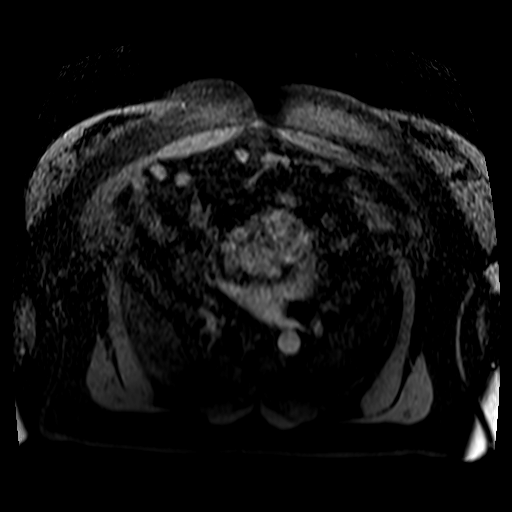

[Series 12: T1 dynamic post-contrast · axial · 3.0mm · 0.88mm/px · z∈[-98,+187]mm · 3 of 96 slices shown (2 of 3)]
[im 1/96]
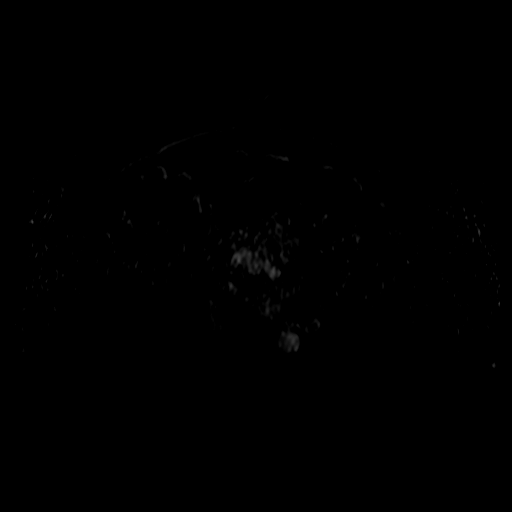
[im 48/96]
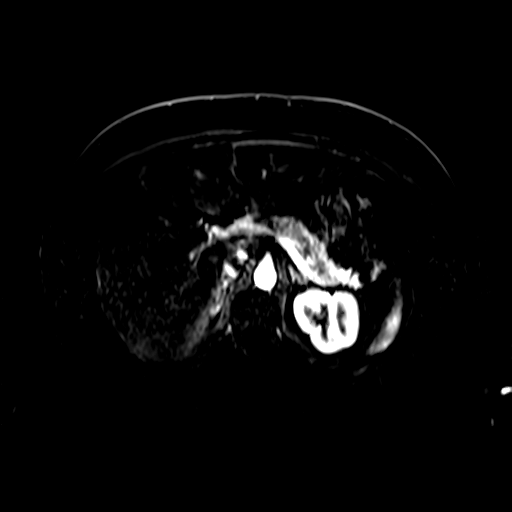
[im 96/96]
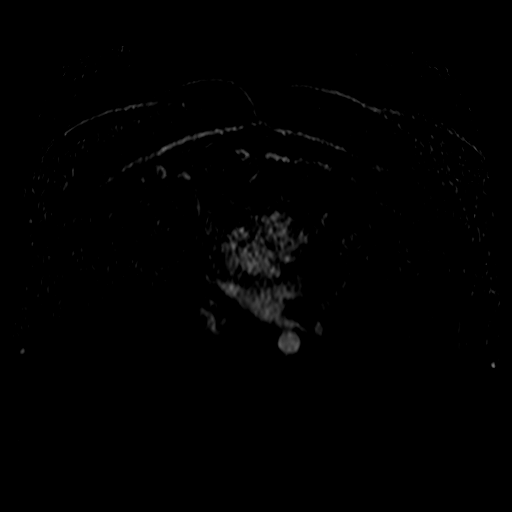

[Series 13: T1 dynamic post-contrast · axial · 3.0mm · 0.88mm/px · 1 of 96 slices shown (3 of 3)]
[im 1/96]
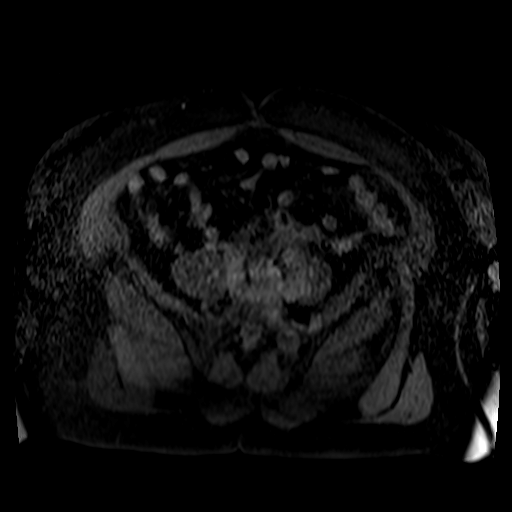

[23 of 48 positions shown; findings below may reference images not displayed]

FINDINGS: Lower chest: No acute abnormality.

Hepatobiliary: Prominent loss of signal throughout the hepatic
parenchyma on out of phase imaging with multiple geographic
wedge-shaped areas of preserved signal for instance in the right
lobe of the liver on image [DATE], corresponding with findings on
prior CT consistent with severe hepatic steatosis with areas of
focal fatty sparing. No suspicious hepatic lesion.

Gallbladder surgically absent. Mild prominence of the biliary tree
is favored reservoir effect post cholecystectomy.

Pancreas: Resolution of the tiny cystic focus in the pancreatic
head. Intrinsic T1 signal a pancreatic parenchyma is within normal
limits. No pancreatic ductal dilation. No cystic or solid
hyperenhancing pancreatic lesion identified.

Spleen:  No splenomegaly or focal splenic lesion.

Adrenals/Urinary Tract: Bilateral adrenal glands appear normal. No
hydronephrosis. No solid enhancing renal mass.

Stomach/Bowel: Visualized portions within the abdomen are
unremarkable.

Vascular/Lymphatic: The portal, splenic and superior mesenteric
veins are patent. Normal caliber abdominal aorta. No pathologically
enlarged abdominal lymph nodes.

Other:  No significant abdominal free fluid.

Musculoskeletal: No suspicious bone lesions identified.
IMPRESSION: 1. Severe hepatic steatosis with areas of focal fatty sparing,
corresponding with findings on prior CT. No suspicious hepatic
lesion.
2. Resolution of the tiny cystic focus in the pancreatic head. No
pancreatic ductal dilation.

## 2022-05-10 ENCOUNTER — Ambulatory Visit: Payer: Medicaid Other | Admitting: Podiatry

## 2022-05-10 DIAGNOSIS — B351 Tinea unguium: Secondary | ICD-10-CM

## 2022-05-10 DIAGNOSIS — L600 Ingrowing nail: Secondary | ICD-10-CM | POA: Diagnosis not present

## 2022-05-10 MED ORDER — CICLOPIROX 8 % EX SOLN: Freq: Every day | CUTANEOUS | 2 refills | Status: DC

## 2022-05-10 NOTE — Progress Notes (Unsigned)
Subjective: Left big toe is starting to become ingrown but she has been "picking it" but not too much so it would not get infected. She has trimmed the right big toe. She has stopped using the cream for some time but it was helping so she has gone back to it.  Denies any systemic complaints such as fevers, chills, nausea, vomiting. No acute changes since last appointment, and no other complaints at this time.   Objective: AAO x3, NAD DP/PT pulses palpable bilaterally, CRT less than 3 seconds Protective sensation intact with Simms Weinstein monofilament, vibratory sensation intact, Achilles tendon reflex intact No areas of pinpoint bony tenderness or pain with vibratory sensation. MMT 5/5, ROM WNL. No edema, erythema, increase in warmth to bilateral lower extremities.  No open lesions or pre-ulcerative lesions.  No pain with calf compression, swelling, warmth, erythema  Assessment:  Plan: -All treatment options discussed with the patient including all alternatives, risks, complications.   -Patient encouraged to call the office with any questions, concerns, change in symptoms.

## 2022-05-10 NOTE — Patient Instructions (Signed)
Ciclopirox Topical Solution What is this medication? CICLOPIROX (sye kloe PEER ox) treats fungal infections of the nails. It belongs to a group of medications called antifungals. It will not treat infections caused by bacteria or viruses. This medicine may be used for other purposes; ask your health care provider or pharmacist if you have questions. COMMON BRAND NAME(S): Ciclodan Nail Solution, CNL8, Penlac What should I tell my care team before I take this medication? They need to know if you have any of these conditions: Diabetes (high blood sugar) Immune system problems Organ transplant Receiving steroid inhalers, cream, or lotion Seizures Tingling of the fingers or toes or other nerve disorder An unusual or allergic reaction to ciclopirox, other medications, foods, dyes, or preservatives Pregnant or trying to get pregnant Breast-feeding How should I use this medication? This medication is for external use only. Do not take by mouth. Wash your hands before and after use. If you are treating your hands, only wash your hands before use. Do not get it in your eyes. If you do, rinse your eyes with plenty of cool tap water. Use it as directed on the prescription label at the same time every day. Do not use it more often than directed. Use the medication for the full course as directed by your care team, even if you think you are better. Do not stop using it unless your care team tells you to stop it early. Apply a thin film of the medication to the affected area. Talk to your care team about the use of this medication in children. While it may be prescribed for children as young as 12 years for selected conditions, precautions do apply. Overdosage: If you think you have taken too much of this medicine contact a poison control center or emergency room at once. NOTE: This medicine is only for you. Do not share this medicine with others. What if I miss a dose? If you miss a dose, use it as soon as  you can. If it is almost time for your next dose, use only that dose. Do not use double or extra doses. What may interact with this medication? Interactions are not expected. Do not use any other skin products without telling your care team. This list may not describe all possible interactions. Give your health care provider a list of all the medicines, herbs, non-prescription drugs, or dietary supplements you use. Also tell them if you smoke, drink alcohol, or use illegal drugs. Some items may interact with your medicine. What should I watch for while using this medication? Visit your care team for regular checks on your progress. It may be some time before you see the benefit from this medication. Do not use nail polish or other nail cosmetic products on the treated nails. Removal of the unattached, infected nail by your care team is needed with use of this medication. If you have diabetes or numbness in your fingers or toes, talk to your care team about proper nail care. What side effects may I notice from receiving this medication? Side effects that you should report to your care team as soon as possible: Allergic reactions--skin rash, itching, hives, swelling of the face, lips, tongue, or throat Burning, itching, crusting, or peeling of treated skin Side effects that usually do not require medical attention (report to your care team if they continue or are bothersome): Change in nail shape, thickness, or color Mild skin irritation, redness, or dryness This list may not describe all possible side   effects. Call your doctor for medical advice about side effects. You may report side effects to FDA at 1-800-FDA-1088. Where should I keep my medication? Keep out of the reach of children and pets. Store at room temperature between 20 and 25 degrees C (68 and 77 degrees F). This medication is flammable. Avoid exposure to heat, fire, flame, and smoking. Get rid of medications that are no longer needed  or have expired: Take the medication to a medication take-back program. Check with your pharmacy or law enforcement to find a location. If you cannot return the medication, check the label or package insert to see if the medication should be thrown out in the garbage or flushed down the toilet. If you are not sure, ask your care team. If it is safe to put in the trash, take the medication out of the container. Mix the medication with cat litter, dirt, coffee grounds, or other unwanted substance. Seal the mixture in a bag or container. Put it in the trash. NOTE: This sheet is a summary. It may not cover all possible information. If you have questions about this medicine, talk to your doctor, pharmacist, or health care provider.  2023 Elsevier/Gold Standard (2021-05-26 00:00:00)  

## 2022-05-12 ENCOUNTER — Encounter: Payer: Self-pay | Admitting: Podiatry

## 2022-05-12 ENCOUNTER — Encounter: Payer: Self-pay | Admitting: Nurse Practitioner

## 2022-05-13 ENCOUNTER — Other Ambulatory Visit: Payer: Self-pay | Admitting: Podiatry

## 2022-05-13 MED ORDER — FLUCONAZOLE 150 MG PO TABS: 150.0000 mg | ORAL_TABLET | ORAL | 0 refills | Status: DC

## 2022-05-24 ENCOUNTER — Other Ambulatory Visit: Payer: Medicaid Other | Admitting: Pharmacist

## 2022-05-24 NOTE — Patient Instructions (Signed)
Lindsey Bowen,   It was great talking to you today!  1) Reschedule follow up with Dr. Shawnee Knapp 2) Schedule follow up with [name]  Keep collaborating with Dr. 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   Check your blood pressure periodically, and any time you have concerning symptoms like headache, chest pain, dizziness, shortness of breath, or vision changes.   Our goal is less than 130/80.  To appropriately check your blood pressure, make sure you do the following:  1) Avoid caffeine, exercise, or tobacco products for 30 minutes before checking. Empty your bladder. 2) Sit with your back supported in a flat-backed chair. Rest your arm on something flat (arm of the chair, table, etc). 3) Sit still with your feet flat on the floor, resting, for at least 5 minutes.  4) Check your blood pressure. Take 1-2 readings.  5) Write down these readings and bring with you to any provider appointments.  Bring your home blood pressure machine with you to a provider's office for accuracy comparison at least once a year.   Make sure you take your blood pressure medications before you come to any office visit, even if you were asked to fast for labs.  Take care!  Lindsey Bowen, PharmD, [facility] [phone]

## 2022-05-24 NOTE — Progress Notes (Signed)
05/24/2022 Name: Lindsey Bowen MRN: [id number] DOB: [birth year]  Chief Complaint  Patient presents with   Medication Management   Diabetes   Hypertension    Lindsey Bowen is a 51 y.o. year old female who presented for a telephone visit.   They were referred to the pharmacist by a quality report for assistance in managing diabetes and hypertension.   Patient is participating in a Managed Medicaid Plan:  Yes  Subjective:  Care Team: Primary Care Provider: Claiborne Rigg, NP ; Next Scheduled Visit: none  [facility]:   Medication Access/Adherence  Current Pharmacy:  [facility] - [place], [place] - [address] [place] [address] Phone: [phone] Fax: [phone]   Patient reports affordability concerns with their medications: No  Patient reports access/transportation concerns to their pharmacy: No  Patient reports adherence concerns with their medications:  No     Diabetes:  Current medications: Humulin R U500 65 units with breakfast, 55 units with lunch, and 55 units with supper Medications tried in the past: Ozempic, Victoza, and Trulicity - significant GI upset; metformin - GI upset even with XR formulation;   Wearing DexCom - spiking overnight   Patient reports hypoglycemic s/sx including dizziness, shakiness, sweating.   Current meal patterns:  - Breakfast: eggs and sausage; [employer] milk - Lunch: sometimes skips, sometimes leftovers, sometimes take out- burgers and fries and other quick take out options - Supper: take out when on the road  Missed appointment with endocrinology. Plans to reschedule.   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   Hypertension:  Current medications: valsartan 80 mg daily  Patient has a validated, automated, upper arm home BP cuff Current blood pressure readings readings: has not been checking lately   Hyperlipidemia/ASCVD  Risk Reduction  Current lipid lowering medications: rosuvastatin 20 mg daily  Health Maintenance  Health Maintenance Due  Topic Date Due   COVID-19 Vaccine (1) Never done   OPHTHALMOLOGY EXAM  Never done   Diabetic kidney evaluation - Urine ACR  12/31/2021   HEMOGLOBIN A1C  03/26/2022   PAP SMEAR-Modifier  05/31/2022     Objective: Lab Results  Component Value Date   HGBA1C 8.8 (H) 09/23/2021    Lab Results  Component Value Date   CREATININE 0.90 11/11/2021   BUN 9 11/11/2021   NA 142 11/11/2021   K 3.7 11/11/2021   CL 102 11/11/2021   CO2 27 11/11/2021    Lab Results  Component Value Date   CHOL 173 09/23/2021   HDL 47 09/23/2021   LDLCALC 109 (H) 09/23/2021   TRIG 95 09/23/2021   CHOLHDL 5.0 (H) 10/20/2020    Medications Reviewed Today     Reviewed by Alden Hipp, RPH-CPP (Pharmacist) on 05/24/22 at [id number]  Med List Status: <None>   Medication Order Taking? Sig Documenting Provider Last Dose Status Informant  acetaminophen (TYLENOL) 650 MG CR tablet [id number] Yes Take 650 mg by mouth every 8 (eight) hours as needed for pain. [provider] Taking Active Self  BD PEN NEEDLE NANO 2ND GEN 32G X 4 MM MISC [id number] Yes ONE EACH BY OTHER ROUTE DAILY. [provider] Taking Active   Blood Pressure Monitor DEVI [id number] Yes Please provide patient with insurance approved blood pressure monitor ICD I 10.0 Claiborne Rigg, NP Taking Active   ciclopirox (PENLAC) 8 % solution [id number] Yes Apply topically at bedtime. Apply over nail and surrounding skin. Apply daily  over previous coat. After seven (7) days, may remove with alcohol and continue cycle. Vivi Barrack, DPM Taking Active   Continuous Blood Gluc Sensor (DEXCOM G7 SENSOR) MISC [id number] Yes APPLY 1 EACH  EVERY 10 DAYS. [provider]  Active   EPINEPHrine 0.3 mg/0.3 mL IJ SOAJ injection [id number]  Inject 0.3 mg into the muscle as needed for anaphylaxis. Claiborne Rigg, NP   Active            Med Note Artemio Aly, NOAH F   Wed Sep 23, 2021 10:29 AM)    fluconazole (DIFLUCAN) 150 MG tablet [id number] Yes Take 1 tablet (150 mg total) by mouth once a week. Vivi Barrack, DPM Taking Active   fluticasone (FLONASE) 50 MCG/ACT nasal spray [id number] Yes Place 2 sprays into both nostrils daily as needed for allergies or rhinitis. [provider] Taking Active   gentamicin ointment (GARAMYCIN) 0.1 % [id number] Yes Apply 1 Application topically 3 (three) times daily. Vivi Barrack, DPM Taking Active   ibuprofen (ADVIL) 200 MG tablet [id number] Yes Take 400 mg by mouth every 6 (six) hours as needed. [provider] Taking Active   insulin regular human CONCENTRATED (HUMULIN R U-500 KWIKPEN) 500 UNIT/ML KwikPen [id number] Yes Inject 65 units under the skin before breakfast and 45 units before lunch and dinner.  Patient taking differently: Inject 65 units under the skin before breakfast and 55 units before lunch and dinner.   [place], Thermon Leyland, FNP Taking Active   levocetirizine (XYZAL) 5 MG tablet [id number] Yes Take 5 mg by mouth daily as needed. [provider] Taking Active   levonorgestrel (LILETTA, 52 MG,) 20.1 MCG/DAY IUD [id number] Yes 1 each by Intrauterine route once. [provider] Taking Active   levothyroxine (SYNTHROID) 175 MCG tablet [id number] Yes Take 175 mcg by mouth daily before breakfast. Take 1 tablet by mouth 6 days per week and 1/2 tablet on Sundays [provider] Taking Active   linaclotide Karlene Einstein) 72 MCG capsule [id number] No Take 1 capsule (72 mcg total) by mouth daily before breakfast.  Patient not taking: Reported on 05/24/2022   Claiborne Rigg, NP Not Taking Active   methocarbamol (ROBAXIN) 500 MG tablet [id number] Yes Take 500 mg by mouth daily as needed for muscle spasms. [provider] Taking Active   Multiple Vitamin (MULTIVITAMIN) capsule [id number] Yes Take 1 capsule by mouth daily. [provider] Taking Active   OZEMPIC, 0.25 OR 0.5 MG/DOSE, 2 MG/3ML SOPN [id number] No Inject 0.5 mg into the skin once a week.  Patient not taking: Reported on 05/24/2022   [provider] Not Taking Active   PROAIR HFA [phone] Base) MCG/ACT inhaler [id number] Yes INHALE 1-2 PUFFS BY MOUTH EVERY 6 HOURS AS NEEDED FOR WHEEZE OR SHORTNESS OF Tonette Bihari, NP Taking Active   rosuvastatin (CRESTOR) 20 MG tablet [id number] Yes Take 1 tablet (20 mg total) by mouth at bedtime. Claiborne Rigg, NP Taking Active   sennosides-docusate sodium (SENOKOT-S) 8.6-50 MG tablet [id number]  Take 2 tablets by mouth daily. Claiborne Rigg, NP  Active   valsartan (DIOVAN) 80 MG tablet [id number] Yes Take 80 mg by mouth daily. [provider] Taking Active   Vitamin D, Ergocalciferol, (DRISDOL) 1.25 MG (50000 UNIT) CAPS capsule [id number] Yes Take 1 capsule (50,000 Units total) by mouth every 7 (seven) days. Thomasene Lot, DO Taking Active             SDOH Interventions Today  Flowsheet Row Most Recent Value  SDOH Interventions   Stress Interventions Other (Comment)  [motivational interviewing]       Assessment/Plan:   Diabetes: - Currently uncontrolled - Reviewed dietary modifications including collaborating with [facility] dietary and lifestyle counseling. Encouraged continued discussions with them regarding brainstorming healthy snacks - Recommend to continue current regimen and schedule follow up with endocrinology.  - Recommend to check glucose using CGM  Hypertension: - Currently controlled per last office readings - Recommend to continue current regimen at this time    Hyperlipidemia/ASCVD Risk Reduction: - Currently uncontrolled per last lipid panel; recommend updated lipid panel with next lab work. Recommend goal LDL <70    Follow Up Plan: patient agrees to continue collaboration; phone call in ~8 weeks  [name], PharmD,  [facility] [phone]

## 2022-05-28 ENCOUNTER — Other Ambulatory Visit: Payer: Self-pay | Admitting: Nurse Practitioner

## 2022-05-28 ENCOUNTER — Other Ambulatory Visit (INDEPENDENT_AMBULATORY_CARE_PROVIDER_SITE_OTHER): Payer: Self-pay | Admitting: Family Medicine

## 2022-05-28 DIAGNOSIS — E785 Hyperlipidemia, unspecified: Secondary | ICD-10-CM

## 2022-05-28 DIAGNOSIS — E559 Vitamin D deficiency, unspecified: Secondary | ICD-10-CM

## 2022-05-31 ENCOUNTER — Other Ambulatory Visit (INDEPENDENT_AMBULATORY_CARE_PROVIDER_SITE_OTHER): Payer: Self-pay | Admitting: Family Medicine

## 2022-05-31 DIAGNOSIS — E559 Vitamin D deficiency, unspecified: Secondary | ICD-10-CM

## 2022-06-07 ENCOUNTER — Ambulatory Visit (INDEPENDENT_AMBULATORY_CARE_PROVIDER_SITE_OTHER): Payer: Medicaid Other | Admitting: Physician Assistant

## 2022-06-23 ENCOUNTER — Encounter: Payer: Self-pay | Admitting: Obstetrics and Gynecology

## 2022-06-23 ENCOUNTER — Other Ambulatory Visit (HOSPITAL_COMMUNITY)
Admission: RE | Admit: 2022-06-23 | Discharge: 2022-06-23 | Disposition: A | Discharge status: Home / Self Care | Payer: Medicaid Other | Source: Ambulatory Visit | Attending: Obstetrics and Gynecology | Admitting: Obstetrics and Gynecology

## 2022-06-23 ENCOUNTER — Ambulatory Visit (INDEPENDENT_AMBULATORY_CARE_PROVIDER_SITE_OTHER): Payer: Medicaid Other | Admitting: Obstetrics and Gynecology

## 2022-06-23 ENCOUNTER — Other Ambulatory Visit: Payer: Self-pay

## 2022-06-23 VITALS — BP 153/87 | HR 91

## 2022-06-23 DIAGNOSIS — Z01419 Encounter for gynecological examination (general) (routine) without abnormal findings: Secondary | ICD-10-CM | POA: Diagnosis not present

## 2022-06-23 NOTE — Progress Notes (Signed)
GYNECOLOGY VISIT  Patient name: Lindsey Bowen MRN [id number]  Date of birth: February 06, 1971 Chief Complaint:   Gynecologic Exam  History:  Lindsey Bowen is a 51 y.o. [phone] being seen today for pap. Reports having incrased yeast infections recently and treating with OTC medications. DM not super well controlled but working on it. No bleeding. IUD in place. .    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   Health Maintenance:   Last pap 05/2019. Results were: NILM w/ HRHPV negative. H/O abnormal pap: no Last mammogram: 12/2021. Results were: normal.   Review of Systems:  Pertinent items are noted in HPI. Comprehensive review of systems was otherwise negative.   Objective:  Physical Exam BP (!) 153/87   Pulse 91    Physical Exam Vitals and nursing note reviewed. Exam conducted with a chaperone present.  Constitutional:      Appearance: Normal appearance.  HENT:     Head: Normocephalic and atraumatic.  Cardiovascular:      Rate and Rhythm: Normal rate and regular rhythm.  Pulmonary:     Effort: Pulmonary effort is normal.     Breath sounds: Normal breath sounds.  Genitourinary:    General: Normal vulva.     Exam position: Lithotomy position.     Vagina: Normal.     Cervix: No friability.     Comments: IUD strings visualized with yellow coating/film on both (see media tab) Normal appearing cervix Small volume discharge.  Skin:    General: Skin is warm and dry.  Neurological:     General: No focal deficit present.     Mental Status: She is alert.  Psychiatric:        Mood and Affect: Mood normal.        Behavior: Behavior normal.        Thought Content: Thought content normal.        Judgment: Judgment normal.     Labs and Imaging No results found.     Assessment & Plan:   1. Well woman exam with routine gynecological exam Screening today including pap Follow up vaginitis. If continued vaginal symptoms consider removal of film from IUD strings Discussed importance of euglycemia in control of vulvovaginal candida - HIV Antibody (routine testing w rflx) - RPR - Hepatitis B Surface AntiGEN - Hepatitis C Antibody - Cervicovaginal ancillary only( [facility]) - Cytology - PAP( [facility])    Routine preventative health maintenance measures emphasized.  Lorriane Shire, MD Minimally Invasive Gynecologic [facility], [facility]

## 2022-06-24 LAB — HIV ANTIBODY (ROUTINE TESTING W REFLEX): HIV Screen 4th Generation wRfx: NONREACTIVE

## 2022-06-24 LAB — CERVICOVAGINAL ANCILLARY ONLY
Bacterial Vaginitis (gardnerella): NEGATIVE
Candida Glabrata: NEGATIVE
Candida Vaginitis: NEGATIVE
Chlamydia: NEGATIVE
Comment: NEGATIVE
Comment: NEGATIVE
Comment: NEGATIVE
Comment: NEGATIVE
Comment: NEGATIVE
Comment: NORMAL
Neisseria Gonorrhea: NEGATIVE
Trichomonas: NEGATIVE

## 2022-06-24 LAB — CYTOLOGY - PAP
Comment: NEGATIVE
Diagnosis: NEGATIVE
High risk HPV: NEGATIVE

## 2022-06-24 LAB — HEPATITIS B SURFACE ANTIGEN: Hepatitis B Surface Ag: NEGATIVE

## 2022-06-24 LAB — HEPATITIS C ANTIBODY: Hep C Virus Ab: NONREACTIVE

## 2022-06-24 LAB — RPR: RPR Ser Ql: NONREACTIVE

## 2022-06-24 NOTE — Progress Notes (Deleted)
TeleHealth Visit:  This visit was completed with telemedicine (audio/video) technology. Lindsey Bowen has verbally consented to this TeleHealth visit. The patient is located at home, the provider is located at home. The participants in this visit include the listed provider and patient. The visit was conducted today via MyChart video.  OBESITY Lindsey Bowen is here to discuss her progress with her obesity treatment plan along with follow-up of her obesity related diagnoses.   Today's visit was # 10 Starting weight: 247 lbs Starting date: 09/23/2021 Weight at last in office visit: 237 lbs on 04/07/22 Total weight loss: 10 lbs at last in office visit on 04/07/22. Today's reported weight: *** lbs No weight reported.   Nutrition Plan: practicing portion control and making smarter food choices, such as increasing vegetables and decreasing simple carbohydrates.    Current exercise: *** Walking for 15 minutes 2 times per week  Interim History:  ***  Assessment/Plan:  1. ***  2. ***  3. ***  Obesity: Current BMI *** Lindsey Bowen {CHL AMB IS/IS NOT:210130109} currently in the action stage of change. As such, her goal is to {MWMwtloss#1:210800005}.  She has agreed to {MWMwtlossportion/plan2:23431}.   Exercise goals: {MWM EXERCISE RECS:23473}  Behavioral modification strategies: {MWMwtlossdietstrategies3:23432}.  Lindsey Bowen has agreed to follow-up with our clinic in {NUMBER 1-10:22536} weeks.   No orders of the defined types were placed in this encounter.   There are no discontinued medications.   No orders of the defined types were placed in this encounter.     Objective:   VITALS: Per patient if applicable, see vitals. GENERAL: Alert and in no acute distress. CARDIOPULMONARY: No increased WOB. Speaking in clear sentences.  PSYCH: Pleasant and cooperative. Speech normal rate and rhythm. Affect is appropriate. Insight and judgement are appropriate. Attention is focused, linear, and  appropriate.  NEURO: Oriented as arrived to appointment on time with no prompting.   Lab Results  Component Value Date   CREATININE 0.90 11/11/2021   BUN 9 11/11/2021   NA 142 11/11/2021   K 3.7 11/11/2021   CL 102 11/11/2021   CO2 27 11/11/2021   Lab Results  Component Value Date   ALT 18 03/18/2022   AST 13 03/18/2022   ALKPHOS 88 03/18/2022   BILITOT <0.2 03/18/2022   Lab Results  Component Value Date   HGBA1C 8.8 (H) 09/23/2021   HGBA1C 9.8 (A) 08/13/2021   HGBA1C 9.9 (H) 05/27/2017   No results found for: "INSULIN" Lab Results  Component Value Date   TSH 4.200 09/23/2021   Lab Results  Component Value Date   CHOL 173 09/23/2021   HDL 47 09/23/2021   LDLCALC 109 (H) 09/23/2021   TRIG 95 09/23/2021   CHOLHDL 5.0 (H) 10/20/2020   Lab Results  Component Value Date   WBC 6.1 03/18/2022   HGB 12.0 03/18/2022   HCT 38.1 03/18/2022   MCV 72 (L) 03/18/2022   PLT 273 03/18/2022   Lab Results  Component Value Date   IRON 52 01/29/2021   FERRITIN 59.7 01/29/2021   FERRITIN 59.7 01/29/2021   Lab Results  Component Value Date   VD25OH 13.8 (L) 09/23/2021   VD25OH 19.1 (L) 03/28/2020    Attestation Statements:   Reviewed by clinician on day of visit: allergies, medications, problem list, medical history, surgical history, family history, social history, and previous encounter notes.  ***(delete if time-based billing not used) Time spent on visit including the items listed below was *** minutes.  -preparing to see the patient (e.g., review of  tests, history, previous notes) -obtaining and/or reviewing separately obtained history -counseling and educating the patient/family/caregiver -documenting clinical information in the electronic or other health record -ordering medications, tests, or procedures -independently interpreting results and communicating results to the patient/ family/caregiver -referring and communicating with other health care professionals   -care coordination

## 2022-06-29 ENCOUNTER — Telehealth (INDEPENDENT_AMBULATORY_CARE_PROVIDER_SITE_OTHER): Payer: Medicaid Other | Admitting: Family Medicine

## 2022-07-05 ENCOUNTER — Ambulatory Visit (INDEPENDENT_AMBULATORY_CARE_PROVIDER_SITE_OTHER): Payer: Medicaid Other | Admitting: Family Medicine

## 2022-07-14 ENCOUNTER — Other Ambulatory Visit: Payer: Medicaid Other | Admitting: Pharmacist

## 2022-07-19 ENCOUNTER — Other Ambulatory Visit: Payer: Medicaid Other | Admitting: Pharmacist

## 2022-07-19 DIAGNOSIS — M545 Low back pain, unspecified: Secondary | ICD-10-CM | POA: Diagnosis not present

## 2022-07-19 DIAGNOSIS — M1712 Unilateral primary osteoarthritis, left knee: Secondary | ICD-10-CM | POA: Diagnosis not present

## 2022-07-21 ENCOUNTER — Other Ambulatory Visit: Payer: Medicaid Other | Admitting: Pharmacist

## 2022-07-25 ENCOUNTER — Emergency Department (HOSPITAL_BASED_OUTPATIENT_CLINIC_OR_DEPARTMENT_OTHER)
Admission: EM | Admit: 2022-07-25 | Discharge: 2022-07-25 | Disposition: A | Discharge status: Home / Self Care | Payer: Medicaid Other | Attending: Emergency Medicine | Admitting: Emergency Medicine

## 2022-07-25 ENCOUNTER — Other Ambulatory Visit: Payer: Self-pay

## 2022-07-25 DIAGNOSIS — Z7951 Long term (current) use of inhaled steroids: Secondary | ICD-10-CM | POA: Diagnosis not present

## 2022-07-25 DIAGNOSIS — R509 Fever, unspecified: Secondary | ICD-10-CM | POA: Diagnosis present

## 2022-07-25 DIAGNOSIS — J45909 Unspecified asthma, uncomplicated: Secondary | ICD-10-CM | POA: Diagnosis not present

## 2022-07-25 DIAGNOSIS — U071 COVID-19: Secondary | ICD-10-CM | POA: Diagnosis not present

## 2022-07-25 LAB — RESP PANEL BY RT-PCR (RSV, FLU A&B, COVID)  RVPGX2
Influenza A by PCR: NEGATIVE
Influenza B by PCR: NEGATIVE
Resp Syncytial Virus by PCR: NEGATIVE
SARS Coronavirus 2 by RT PCR: POSITIVE — AB

## 2022-07-25 MED ORDER — GUAIFENESIN ER 1200 MG PO TB12: 1.0000 | ORAL_TABLET | Freq: Two times a day (BID) | ORAL | 0 refills | Status: DC

## 2022-07-25 MED ORDER — BENZONATATE 100 MG PO CAPS: 100.0000 mg | ORAL_CAPSULE | Freq: Three times a day (TID) | ORAL | 0 refills | Status: DC

## 2022-07-25 NOTE — ED Triage Notes (Addendum)
Arrives POV from home. Aox4.   Complains of cough, body aches, fever- starting last Thursday. Some relief with Nyquil. Concerned about COVID due to family member being poisitive. Reports HX asthma- has not been using inhaler more than is typical for her.

## 2022-07-25 NOTE — Discharge Instructions (Signed)
Take the medications to help with your cough and congestion.  Rest drink plenty of fluids.  Return as needed for worsening symptoms.

## 2022-07-25 NOTE — ED Provider Notes (Signed)
[facility] Provider Note   CSN: [id number] Arrival date & time: 07/25/22  1913     History  Chief Complaint  Patient presents with   Fever   Cough    Lindsey Bowen is a 52 y.o. female.   Fever Associated symptoms: cough   Cough Associated symptoms: fever      Patient presented to the ER for evaluation of cough congestion.  Patient states she started having symptoms last Thursday.  She has had bodyaches cough feeling feverish.  Patient was told by another family member that they had Pettit.  Patient has not had any vomiting or diarrhea.  She is not feeling short of breath.  Home Medications Prior to Admission medications   Medication Sig Start Date End Date Taking? Authorizing Provider  benzonatate (TESSALON) 100 MG capsule Take 1 capsule (100 mg total) by mouth every 8 (eight) hours. 07/25/22  Yes Dorie Rank, MD  Guaifenesin 1200 MG TB12 Take 1 tablet (1,200 mg total) by mouth 2 (two) times daily at 10 AM and 5 PM. 07/25/22  Yes Dorie Rank, MD  acetaminophen (TYLENOL) 650 MG CR tablet Take 650 mg by mouth every 8 (eight) hours as needed for pain.    [provider]  BD PEN NEEDLE NANO 2ND GEN 32G X 4 MM MISC ONE EACH BY OTHER ROUTE DAILY. 05/18/19   [provider]  Blood Pressure Monitor DEVI Please provide patient with insurance approved blood pressure monitor ICD I 10.0 11/11/21   Gildardo Pounds, NP  ciclopirox (PENLAC) 8 % solution Apply topically at bedtime. Apply over nail and surrounding skin. Apply daily over previous coat. After seven (7) days, may remove with alcohol and continue cycle. Patient not taking: Reported on 06/23/2022 05/10/22   Trula Slade, DPM  Continuous Blood Gluc Sensor (DEXCOM G7 SENSOR) MISC APPLY 1 EACH  EVERY 10 DAYS. 05/05/22   [provider]  EPINEPHrine 0.3 mg/0.3 mL IJ SOAJ injection Inject 0.3 mg into the muscle as needed for anaphylaxis. 10/20/20   Gildardo Pounds,  NP  fluconazole (DIFLUCAN) 150 MG tablet Take 1 tablet (150 mg total) by mouth once a week. Patient not taking: Reported on 06/23/2022 05/13/22   Trula Slade, DPM  fluticasone [facility]) 50 MCG/ACT nasal spray Place 2 sprays into both nostrils daily as needed for allergies or rhinitis.    [provider]  gentamicin ointment (GARAMYCIN) 0.1 % Apply 1 Application topically 3 (three) times daily. 03/26/22   Trula Slade, DPM  ibuprofen (ADVIL) 200 MG tablet Take 400 mg by mouth every 6 (six) hours as needed.    [provider]  insulin regular human CONCENTRATED (HUMULIN R U-500 KWIKPEN) 500 UNIT/ML KwikPen Inject 65 units under the skin before breakfast and 45 units before lunch and dinner. Patient taking differently: Inject 65 units under the skin before breakfast and 55 units before lunch and dinner. 12/21/21   Whitmire, Joneen Boers, FNP  levocetirizine (XYZAL) 5 MG tablet Take 5 mg by mouth daily as needed.    [provider]  levonorgestrel (LILETTA, 52 MG,) 20.1 MCG/DAY IUD 1 each by Intrauterine route once.    [provider]  levothyroxine (SYNTHROID) 175 MCG tablet Take 175 mcg by mouth daily before breakfast. Take 1 tablet by mouth 6 days per week and 1/2 tablet on Sundays    [provider]  linaclotide (LINZESS) 72 MCG capsule Take 1 capsule (72 mcg total) by mouth daily  before breakfast. Patient not taking: Reported on 05/24/2022 02/12/22   Gildardo Pounds, NP  methocarbamol (ROBAXIN) 500 MG tablet Take 500 mg by mouth daily as needed for muscle spasms.    [provider]  Multiple Vitamin (MULTIVITAMIN) capsule Take 1 capsule by mouth daily.    [provider]  OZEMPIC, 0.25 OR 0.5 MG/DOSE, 2 MG/3ML SOPN Inject 0.5 mg into the skin once a week. Patient not taking: Reported on 05/24/2022 12/03/21   [provider]  PROAIR HFA 108 (90 Base) MCG/ACT inhaler INHALE 1-2 PUFFS BY MOUTH EVERY 6 HOURS AS NEEDED FOR WHEEZE  OR SHORTNESS OF BREATH 04/20/21   Gildardo Pounds, NP  rosuvastatin (CRESTOR) 20 MG tablet TAKE 1 TABLET (20 MG TOTAL) BY MOUTH AT BEDTIME. 05/28/22   Charlott Rakes, MD  sennosides-docusate sodium (SENOKOT-S) 8.6-50 MG tablet Take 2 tablets by mouth daily. 11/11/21   Gildardo Pounds, NP  valsartan (DIOVAN) 80 MG tablet Take 80 mg by mouth daily. 02/04/22   [provider]  Vitamin D, Ergocalciferol, (DRISDOL) 1.25 MG (50000 UNIT) CAPS capsule Take 1 capsule (50,000 Units total) by mouth every 7 (seven) days. 04/07/22   Opalski, Neoma Laming, DO      Allergies    Dust mite extract and Shellfish allergy    Review of Systems   Review of Systems  Constitutional:  Positive for fever.  Respiratory:  Positive for cough.     Physical Exam Updated Vital Signs BP (!) 144/73   Pulse 90   Temp 98.7 F (37.1 C) (Oral)   Resp 18   Ht 1.651 m (5\' 5" )   Wt 107.5 kg   SpO2 100%   BMI 39.44 kg/m  Physical Exam Vitals and nursing note reviewed.  Constitutional:      General: She is not in acute distress.    Appearance: She is well-developed.  HENT:     Head: Normocephalic and atraumatic.     Right Ear: External ear normal.     Left Ear: External ear normal.     Nose: Congestion present.  Eyes:     General: No scleral icterus.       Right eye: No discharge.        Left eye: No discharge.     Conjunctiva/sclera: Conjunctivae normal.  Neck:     Trachea: No tracheal deviation.  Cardiovascular:     Rate and Rhythm: Normal rate and regular rhythm.  Pulmonary:     Effort: Pulmonary effort is normal. No respiratory distress.     Breath sounds: Normal breath sounds. No stridor. No wheezing or rales.  Abdominal:     General: Bowel sounds are normal. There is no distension.     Palpations: Abdomen is soft.     Tenderness: There is no abdominal tenderness. There is no guarding or rebound.  Musculoskeletal:        General: No tenderness or deformity.     Cervical back: Neck supple.   Skin:    General: Skin is warm and dry.     Findings: No rash.  Neurological:     General: No focal deficit present.     Mental Status: She is alert.     Cranial Nerves: No cranial nerve deficit, dysarthria or facial asymmetry.     Sensory: No sensory deficit.     Motor: No abnormal muscle tone or seizure activity.     Coordination: Coordination normal.  Psychiatric:        Mood and  Affect: Mood normal.     ED Results / Procedures / Treatments   Labs (all labs ordered are listed, but only abnormal results are displayed) Labs Reviewed  RESP PANEL BY RT-PCR (RSV, FLU A&B, COVID)  [id number] - Abnormal; Notable for the following components:      Result Value   SARS Coronavirus 2 by RT PCR POSITIVE (*)    All other components within normal limits    EKG None  Radiology No results found.  Procedures Procedures    Medications Ordered in ED Medications - No data to display  ED Course/ Medical Decision Making/ A&P                             Medical Decision Making Problems Addressed: COVID-19 virus infection: acute illness or injury  Amount and/or Complexity of Data Reviewed Labs: ordered. Decision-making details documented in ED Course.  Risk OTC drugs. Prescription drug management.   Patient's vital signs are reassuring.  She is not hypoxic.  She is not tachypneic.  Lungs are clear without wheezing rhonchi or crackles.  Patient's COVID test is positive.  Discussed Paxlovid with her history of asthma but patient is not interested in this at this time.  Will DC home with supportive care.        Final Clinical Impression(s) / ED Diagnoses Final diagnoses:  COVID-19 virus infection    Rx / DC Orders ED Discharge Orders          Ordered    benzonatate (TESSALON) 100 MG capsule  Every 8 hours        07/25/22 2115    Guaifenesin 1200 MG TB12  2 times daily        07/25/22 2115              Linwood Dibbles, MD 07/25/22 2118

## 2022-08-02 ENCOUNTER — Other Ambulatory Visit: Payer: Medicaid Other | Admitting: Pharmacist

## 2022-08-02 ENCOUNTER — Telehealth: Payer: Self-pay | Admitting: Pharmacist

## 2022-08-02 DIAGNOSIS — M47816 Spondylosis without myelopathy or radiculopathy, lumbar region: Secondary | ICD-10-CM | POA: Diagnosis not present

## 2022-08-02 DIAGNOSIS — M545 Low back pain, unspecified: Secondary | ICD-10-CM | POA: Insufficient documentation

## 2022-08-02 NOTE — Progress Notes (Signed)
Contacted patient for scheduled appointment, she was unable to talk at this time.   Upcoming appointment with PCP on 2/16. If BP still elevated or other pharmacy needs, recommend collaboration with [facility] pharmacist, Acquanetta Belling Ausdall.   Catie Hedwig Morton, PharmD, [place], [facility] [phone]

## 2022-08-10 DIAGNOSIS — M47816 Spondylosis without myelopathy or radiculopathy, lumbar region: Secondary | ICD-10-CM | POA: Diagnosis not present

## 2022-08-13 ENCOUNTER — Ambulatory Visit: Payer: Medicaid Other | Admitting: Nurse Practitioner

## 2022-08-13 DIAGNOSIS — I10 Essential (primary) hypertension: Secondary | ICD-10-CM

## 2022-08-13 NOTE — Progress Notes (Signed)
Patient did not want to wait as provider was behind today

## 2022-08-24 DIAGNOSIS — M47816 Spondylosis without myelopathy or radiculopathy, lumbar region: Secondary | ICD-10-CM | POA: Diagnosis not present

## 2022-09-14 DIAGNOSIS — M47816 Spondylosis without myelopathy or radiculopathy, lumbar region: Secondary | ICD-10-CM | POA: Diagnosis not present

## 2022-09-28 DIAGNOSIS — M47816 Spondylosis without myelopathy or radiculopathy, lumbar region: Secondary | ICD-10-CM | POA: Diagnosis not present

## 2022-09-28 DIAGNOSIS — M47896 Other spondylosis, lumbar region: Secondary | ICD-10-CM | POA: Diagnosis not present

## 2022-10-12 ENCOUNTER — Encounter: Payer: Self-pay | Admitting: Nurse Practitioner

## 2022-10-12 ENCOUNTER — Ambulatory Visit: Payer: Medicaid Other | Attending: Nurse Practitioner | Admitting: Nurse Practitioner

## 2022-10-12 VITALS — BP 140/80 | HR 86 | Ht 65.0 in | Wt 252.8 lb

## 2022-10-12 DIAGNOSIS — K5901 Slow transit constipation: Secondary | ICD-10-CM | POA: Diagnosis not present

## 2022-10-12 DIAGNOSIS — J302 Other seasonal allergic rhinitis: Secondary | ICD-10-CM

## 2022-10-12 DIAGNOSIS — Z23 Encounter for immunization: Secondary | ICD-10-CM

## 2022-10-12 DIAGNOSIS — G4733 Obstructive sleep apnea (adult) (pediatric): Secondary | ICD-10-CM

## 2022-10-12 DIAGNOSIS — E1129 Type 2 diabetes mellitus with other diabetic kidney complication: Secondary | ICD-10-CM | POA: Diagnosis not present

## 2022-10-12 DIAGNOSIS — M7989 Other specified soft tissue disorders: Secondary | ICD-10-CM | POA: Diagnosis not present

## 2022-10-12 DIAGNOSIS — E785 Hyperlipidemia, unspecified: Secondary | ICD-10-CM

## 2022-10-12 DIAGNOSIS — Z0001 Encounter for general adult medical examination with abnormal findings: Secondary | ICD-10-CM | POA: Diagnosis not present

## 2022-10-12 DIAGNOSIS — R809 Proteinuria, unspecified: Secondary | ICD-10-CM

## 2022-10-12 DIAGNOSIS — E039 Hypothyroidism, unspecified: Secondary | ICD-10-CM | POA: Diagnosis not present

## 2022-10-12 DIAGNOSIS — T782XXS Anaphylactic shock, unspecified, sequela: Secondary | ICD-10-CM | POA: Diagnosis not present

## 2022-10-12 DIAGNOSIS — Z0000 Encounter for general adult medical examination without abnormal findings: Secondary | ICD-10-CM

## 2022-10-12 DIAGNOSIS — I10 Essential (primary) hypertension: Secondary | ICD-10-CM

## 2022-10-12 DIAGNOSIS — D649 Anemia, unspecified: Secondary | ICD-10-CM | POA: Diagnosis not present

## 2022-10-12 MED ORDER — EPINEPHRINE 0.3 MG/0.3ML IJ SOAJ: 0.3000 mg | INTRAMUSCULAR | 1 refills | Status: DC | PRN

## 2022-10-12 MED ORDER — ROSUVASTATIN CALCIUM 20 MG PO TABS: 20.0000 mg | ORAL_TABLET | Freq: Every day | ORAL | 0 refills | Status: DC

## 2022-10-12 MED ORDER — SENNA-DOCUSATE SODIUM 8.6-50 MG PO TABS: 2.0000 | ORAL_TABLET | Freq: Every day | ORAL | 6 refills | Status: DC

## 2022-10-12 MED ORDER — FUROSEMIDE 20 MG PO TABS: 20.0000 mg | ORAL_TABLET | Freq: Every day | ORAL | 0 refills | Status: DC

## 2022-10-12 MED ORDER — ALBUTEROL SULFATE HFA 108 (90 BASE) MCG/ACT IN AERS: 1.0000 | INHALATION_SPRAY | Freq: Four times a day (QID) | RESPIRATORY_TRACT | 1 refills | Status: DC | PRN

## 2022-10-12 MED ORDER — VALSARTAN 80 MG PO TABS: 80.0000 mg | ORAL_TABLET | Freq: Every day | ORAL | 1 refills | Status: DC

## 2022-10-12 NOTE — Progress Notes (Signed)
No concerns. 

## 2022-10-12 NOTE — Progress Notes (Signed)
Assessment & Plan:  Lindsey Bowen was seen today for annual exam.  Diagnoses and all orders for this visit:  Primary hypertension Poorly controlled -     valsartan (DIOVAN) 80 MG tablet; Take 1 tablet (80 mg total) by mouth daily. Follow up in 2 weeks virtual visit for BP check. If continues elevated will need to increase valsartan or add HCTZ  Need for shingles vaccine -     Varicella-zoster vaccine IM  Slow transit constipation Only taking linzess every other day. Not drinking enough water based on weight -     sennosides-docusate sodium (SENOKOT-S) 8.6-50 MG tablet; Take 2 tablets by mouth daily.  Seasonal allergies -     albuterol (PROAIR HFA) 108 (90 Base) MCG/ACT inhaler; Inhale 1-2 puffs into the lungs every 6 (six) hours as needed for wheezing or shortness of breath.  Hyperlipidemia LDL goal <70 -     rosuvastatin (CRESTOR) 20 MG tablet; Take 1 tablet (20 mg total) by mouth at bedtime.  Anaphylaxis, sequela -     EPINEPHrine 0.3 mg/0.3 mL IJ SOAJ injection; Inject 0.3 mg into the muscle as needed for anaphylaxis.  Type 2 diabetes mellitus with microalbuminuria -     Ambulatory referral to Endocrinology -     CMP14+EGFR -     Microalbumin / creatinine urine ratio -     Hemoglobin A1c  Anemia, unspecified type -     CBC with Differential  Hypothyroidism, unspecified type -     Thyroid Panel With TSH  OSA (obstructive sleep apnea) -     Split night study; Future  Leg swelling -     furosemide (LASIX) 20 MG tablet; Take 1 tablet (20 mg total) by mouth daily for 7 days.    Patient has been counseled on age-appropriate routine health concerns for screening and prevention. These are reviewed and up-to-date. Referrals have been placed accordingly. Immunizations are up-to-date or declined.    Subjective:   Chief Complaint  Patient presents with   Annual Exam   HPI Lindsey Bowen 52 y.o. female presents to office today for follow up to HTN   She has a past  medical history of Anemia, Anxiety, Asthma, Bilateral edema of lower extremity, Chronic pain of left knee, Constipation, Depression, Diabetes mellitus type 2 with neurological manifestations (05/27/2017), Gallstones, Hypothyroidism, Lumbar pain, Migraine, Onychodystrophy, Onychomycosis, Other fatigue, Paresthesia (05/17/2017), Sickle cell trait, Sleep apnea, TIA (05/26/2017), and Type 2 diabetes mellitus with microalbuminuria (05/27/2017).    Hypertension Blood pressure is elevated today.  She is taking valsartan 80 mg daily. Can not recall any home readings. She also has a history of OSA but never received her CPAP. Also notes significant BLE edema since returning from a cruise. Denies chest pain or shortness of breath.  BP Readings from Last 3 Encounters:  10/12/22 (!) 140/80  07/25/22 (!) 144/73  06/23/22 (!) 153/87    OSA Sleep study revealed positive for OSA 05-07-2019. She endorses excessive daytime sleepiness and frequent headaches. BMI 42. She does snore and endorses apneic episodes.  DM and Hypothyroidism Requesting referral for different endocrinologist. She is currently taking levothyroxine as prescribed. A1c not at goal.  Lab Results  Component Value Date   HGBA1C 8.8 (H) 09/23/2021   Lab Results  Component Value Date   TSH 4.200 09/23/2021    Review of Systems  Constitutional:  Negative for fever, malaise/fatigue and weight loss.  HENT: Negative.  Negative for nosebleeds.   Eyes: Negative.  Negative for blurred vision, double  vision and photophobia.  Respiratory: Negative.  Negative for cough and shortness of breath.   Cardiovascular:  Positive for leg swelling. Negative for chest pain and palpitations.  Gastrointestinal:  Positive for constipation. Negative for heartburn, nausea and vomiting.  Genitourinary: Negative.   Musculoskeletal: Negative.  Negative for myalgias.  Skin: Negative.   Neurological: Negative.  Negative for dizziness, focal weakness, seizures and  headaches.  Endo/Heme/Allergies: Negative.   Psychiatric/Behavioral: Negative.  Negative for suicidal ideas.     Past Medical History:  Diagnosis Date   Anemia    Anxiety    Asthma    Bilateral edema of lower extremity    Chronic pain of left knee    Constipation    Depression    Diabetes mellitus type 2 with neurological manifestations 05/27/2017   Diabetes mellitus without complication    Gallstones    Hypothyroidism    Joint pain    Lumbar pain    Migraine    Onychodystrophy    Onychomycosis    Other fatigue    Paresthesia 05/17/2017   Shortness of breath on exertion    Sickle cell trait    Sleep apnea    TIA (transient ischemic attack) 05/26/2017   Type 2 diabetes mellitus with microalbuminuria 05/27/2017    Past Surgical History:  Procedure Laterality Date   CHOLECYSTECTOMY     DILATION AND CURETTAGE OF UTERUS     HERNIA REPAIR      Family History  Problem Relation Age of Onset   Diabetes Mother    Hypertension Mother    Addison's disease Mother    Asthma Mother    Colon polyps Mother    Irritable bowel syndrome Mother    Glaucoma Father    Mental retardation Sister    Glaucoma Sister    Heart attack Maternal Aunt    Heart attack Maternal Uncle    Diabetes Paternal Uncle    Heart attack Maternal Grandmother    Stroke Maternal Grandmother    Ulcerative colitis Maternal Grandfather    Diabetes Maternal Grandfather    Stomach cancer Maternal Grandfather    Diabetes Paternal Grandmother    Irritable bowel syndrome Daughter    Diabetes Son    Colon cancer Neg Hx    Esophageal cancer Neg Hx    Rectal cancer Neg Hx     Social History Reviewed with no changes to be made today.   Outpatient Medications Prior to Visit  Medication Sig Dispense Refill   acetaminophen (TYLENOL) 650 MG CR tablet Take 650 mg by mouth every 8 (eight) hours as needed for pain.     BD PEN NEEDLE NANO 2ND GEN 32G X 4 MM MISC ONE EACH BY OTHER ROUTE DAILY.     Blood Pressure  Monitor DEVI Please provide patient with insurance approved blood pressure monitor ICD I 10.0 1 each 0   Continuous Blood Gluc Sensor (DEXCOM G7 SENSOR) MISC APPLY 1 EACH  EVERY 10 DAYS.     fluticasone (FLONASE) 50 MCG/ACT nasal spray Place 2 sprays into both nostrils daily as needed for allergies or rhinitis.     Guaifenesin 1200 MG TB12 Take 1 tablet (1,200 mg total) by mouth 2 (two) times daily at 10 AM and 5 PM. 14 tablet 0   ibuprofen (ADVIL) 200 MG tablet Take 400 mg by mouth every 6 (six) hours as needed.     insulin regular human CONCENTRATED (HUMULIN R U-500 KWIKPEN) 500 UNIT/ML KwikPen Inject 65 units under the skin before  breakfast and 45 units before lunch and dinner. (Patient taking differently: Inject 65 units under the skin before breakfast and 55 units before lunch and dinner.)     levocetirizine (XYZAL) 5 MG tablet Take 5 mg by mouth daily as needed.     levonorgestrel (LILETTA, 52 MG,) 20.1 MCG/DAY IUD 1 each by Intrauterine route once.     levothyroxine (SYNTHROID) 175 MCG tablet Take 175 mcg by mouth daily before breakfast. Take 1 tablet by mouth 6 days per week and 1/2 tablet on Sundays     linaclotide (LINZESS) 72 MCG capsule Take 1 capsule (72 mcg total) by mouth daily before breakfast. 30 capsule 3   methocarbamol (ROBAXIN) 500 MG tablet Take 500 mg by mouth daily as needed for muscle spasms.     Multiple Vitamin (MULTIVITAMIN) capsule Take 1 capsule by mouth daily.     Vitamin D, Ergocalciferol, (DRISDOL) 1.25 MG (50000 UNIT) CAPS capsule Take 1 capsule (50,000 Units total) by mouth every 7 (seven) days. 4 capsule 0   EPINEPHrine 0.3 mg/0.3 mL IJ SOAJ injection Inject 0.3 mg into the muscle as needed for anaphylaxis. 1 each 1   PROAIR HFA 108 (90 Base) MCG/ACT inhaler INHALE 1-2 PUFFS BY MOUTH EVERY 6 HOURS AS NEEDED FOR WHEEZE OR SHORTNESS OF BREATH 18 g 1   rosuvastatin (CRESTOR) 20 MG tablet TAKE 1 TABLET (20 MG TOTAL) BY MOUTH AT BEDTIME. 90 tablet 0    sennosides-docusate sodium (SENOKOT-S) 8.6-50 MG tablet Take 2 tablets by mouth daily. 60 tablet 6   valsartan (DIOVAN) 80 MG tablet Take 80 mg by mouth daily.     benzonatate (TESSALON) 100 MG capsule Take 1 capsule (100 mg total) by mouth every 8 (eight) hours. 21 capsule 0   ciclopirox (PENLAC) 8 % solution Apply topically at bedtime. Apply over nail and surrounding skin. Apply daily over previous coat. After seven (7) days, may remove with alcohol and continue cycle. (Patient not taking: Reported on 06/23/2022) 6.6 mL 2   fluconazole (DIFLUCAN) 150 MG tablet Take 1 tablet (150 mg total) by mouth once a week. (Patient not taking: Reported on 06/23/2022) 12 tablet 0   gentamicin ointment (GARAMYCIN) 0.1 % Apply 1 Application topically 3 (three) times daily. (Patient not taking: Reported on 10/12/2022) 15 g 0   OZEMPIC, 0.25 OR 0.5 MG/DOSE, 2 MG/3ML SOPN Inject 0.5 mg into the skin once a week. (Patient not taking: Reported on 05/24/2022)     No facility-administered medications prior to visit.    Allergies  Allergen Reactions   Dust Mite Extract Hives and Itching   Shellfish Allergy Anaphylaxis       Objective:    BP (!) 140/80   Pulse 86   Ht 5\' 5"  (1.651 m)   Wt 252 lb 12.8 oz (114.7 kg)   LMP 07/28/2022 (Approximate)   SpO2 98%   BMI 42.07 kg/m  Wt Readings from Last 3 Encounters:  10/12/22 252 lb 12.8 oz (114.7 kg)  07/25/22 237 lb (107.5 kg)  04/07/22 237 lb (107.5 kg)    Physical Exam Constitutional:      Appearance: She is well-developed.  HENT:     Head: Normocephalic and atraumatic.     Right Ear: Hearing, tympanic membrane, ear canal and external ear normal.     Left Ear: Hearing, tympanic membrane, ear canal and external ear normal.     Nose: Nose normal.     Right Turbinates: Not enlarged.     Left Turbinates: Not enlarged.  Mouth/Throat:     Lips: Pink.     Mouth: Mucous membranes are moist.     Dentition: No dental tenderness, gingival swelling, dental  abscesses or gum lesions.     Pharynx: No oropharyngeal exudate.  Eyes:     General: No scleral icterus.       Right eye: No discharge.     Extraocular Movements: Extraocular movements intact.     Conjunctiva/sclera: Conjunctivae normal.     Pupils: Pupils are equal, round, and reactive to light.  Neck:     Thyroid: No thyromegaly.     Trachea: No tracheal deviation.  Cardiovascular:     Rate and Rhythm: Normal rate and regular rhythm.     Heart sounds: Normal heart sounds. No murmur heard.    No friction rub.  Pulmonary:     Effort: Pulmonary effort is normal. No accessory muscle usage or respiratory distress.     Breath sounds: Normal breath sounds. No decreased breath sounds, wheezing, rhonchi or rales.  Abdominal:     General: Bowel sounds are normal. There is no distension.     Palpations: Abdomen is soft. There is no mass.     Tenderness: There is no abdominal tenderness. There is no right CVA tenderness, left CVA tenderness, guarding or rebound.     Hernia: No hernia is present.  Musculoskeletal:        General: No tenderness or deformity. Normal range of motion.     Cervical back: Normal range of motion and neck supple.     Right lower leg: Swelling present. Edema present.     Left lower leg: Swelling present. Edema present.     Right ankle: Swelling present.     Left ankle: Swelling present.  Lymphadenopathy:     Cervical: No cervical adenopathy.  Skin:    General: Skin is warm and dry.     Findings: No erythema.  Neurological:     Mental Status: She is alert and oriented to person, place, and time.     Cranial Nerves: No cranial nerve deficit.     Motor: Motor function is intact.     Coordination: Coordination is intact. Coordination normal.     Gait: Gait is intact.     Deep Tendon Reflexes:     Reflex Scores:      Patellar reflexes are 1+ on the right side and 1+ on the left side. Psychiatric:        Attention and Perception: Attention normal.        Mood and  Affect: Mood normal.        Speech: Speech normal.        Behavior: Behavior normal.        Thought Content: Thought content normal.        Judgment: Judgment normal.          Patient has been counseled extensively about nutrition and exercise as well as the importance of adherence with medications and regular follow-up. The patient was given clear instructions to go to ER or return to medical center if symptoms don't improve, worsen or new problems develop. The patient verbalized understanding.   Follow-up: Return in about 3 weeks (around 11/02/2022) for BP recheck.   Claiborne Rigg, FNP-BC [facility] [place], [place] [phone]   10/12/2022, 1:21 PM

## 2022-10-13 LAB — CBC WITH DIFFERENTIAL/PLATELET
Basophils Absolute: 0 10*3/uL (ref 0.0–0.2)
Basos: 0 %
EOS (ABSOLUTE): 0.2 10*3/uL (ref 0.0–0.4)
Eos: 2 %
Hematocrit: 39.3 % (ref 34.0–46.6)
Hemoglobin: 12.2 g/dL (ref 11.1–15.9)
Immature Grans (Abs): 0 10*3/uL (ref 0.0–0.1)
Immature Granulocytes: 0 %
Lymphocytes Absolute: 2.8 10*3/uL (ref 0.7–3.1)
Lymphs: 40 %
MCH: 23.2 pg — ABNORMAL LOW (ref 26.6–33.0)
MCHC: 31 g/dL — ABNORMAL LOW (ref 31.5–35.7)
MCV: 75 fL — ABNORMAL LOW (ref 79–97)
Monocytes Absolute: 0.5 10*3/uL (ref 0.1–0.9)
Monocytes: 6 %
Neutrophils Absolute: 3.6 10*3/uL (ref 1.4–7.0)
Neutrophils: 52 %
Platelets: 274 10*3/uL (ref 150–450)
RBC: 5.26 x10E6/uL (ref 3.77–5.28)
RDW: 14.2 % (ref 11.7–15.4)
WBC: 7 10*3/uL (ref 3.4–10.8)

## 2022-10-13 LAB — CMP14+EGFR
ALT: 18 IU/L (ref 0–32)
AST: 16 IU/L (ref 0–40)
Albumin/Globulin Ratio: 1.8 (ref 1.2–2.2)
Albumin: 4.2 g/dL (ref 3.9–4.9)
Alkaline Phosphatase: 83 IU/L (ref 44–121)
BUN/Creatinine Ratio: 10 (ref 9–23)
BUN: 10 mg/dL (ref 6–24)
Bilirubin Total: 0.3 mg/dL (ref 0.0–1.2)
CO2: 26 mmol/L (ref 20–29)
Calcium: 9.4 mg/dL (ref 8.7–10.2)
Chloride: 102 mmol/L (ref 96–106)
Creatinine, Ser: 1.01 mg/dL — ABNORMAL HIGH (ref 0.57–1.00)
Globulin, Total: 2.3 g/dL (ref 1.5–4.5)
Glucose: 165 mg/dL — ABNORMAL HIGH (ref 70–99)
Potassium: 3.3 mmol/L — ABNORMAL LOW (ref 3.5–5.2)
Sodium: 140 mmol/L (ref 134–144)
Total Protein: 6.5 g/dL (ref 6.0–8.5)
eGFR: 68 mL/min/{1.73_m2} (ref >59)

## 2022-10-13 LAB — THYROID PANEL WITH TSH
Free Thyroxine Index: 2.2 (ref 1.2–4.9)
T3 Uptake Ratio: 26 % (ref 24–39)
T4, Total: 8.4 ug/dL (ref 4.5–12.0)
TSH: 3.5 u[IU]/mL (ref 0.450–4.500)

## 2022-10-13 LAB — HEMOGLOBIN A1C
Est. average glucose Bld gHb Est-mCnc: 226 mg/dL
Hgb A1c MFr Bld: 9.5 % — ABNORMAL HIGH (ref 4.8–5.6)

## 2022-10-14 LAB — MICROALBUMIN / CREATININE URINE RATIO
Creatinine, Urine: 266.1 mg/dL
Microalb/Creat Ratio: 124 mg/g creat — ABNORMAL HIGH (ref 0–29)
Microalbumin, Urine: 330.9 ug/mL

## 2022-10-16 ENCOUNTER — Other Ambulatory Visit: Payer: Self-pay | Admitting: Nurse Practitioner

## 2022-10-16 MED ORDER — POTASSIUM CHLORIDE CRYS ER 20 MEQ PO TBCR: 20.0000 meq | EXTENDED_RELEASE_TABLET | Freq: Every day | ORAL | 0 refills | Status: DC

## 2022-10-20 ENCOUNTER — Encounter: Payer: Self-pay | Admitting: Nurse Practitioner

## 2022-10-21 DIAGNOSIS — M545 Low back pain, unspecified: Secondary | ICD-10-CM | POA: Diagnosis not present

## 2022-10-27 ENCOUNTER — Telehealth: Payer: Medicaid Other | Admitting: Nurse Practitioner

## 2022-11-24 ENCOUNTER — Other Ambulatory Visit: Payer: Self-pay | Admitting: Nurse Practitioner

## 2022-11-24 ENCOUNTER — Encounter: Payer: Self-pay | Admitting: Nurse Practitioner

## 2022-11-24 DIAGNOSIS — N760 Acute vaginitis: Secondary | ICD-10-CM

## 2022-12-01 ENCOUNTER — Other Ambulatory Visit: Payer: Self-pay | Admitting: Nurse Practitioner

## 2022-12-01 DIAGNOSIS — J302 Other seasonal allergic rhinitis: Secondary | ICD-10-CM

## 2022-12-01 DIAGNOSIS — T782XXS Anaphylactic shock, unspecified, sequela: Secondary | ICD-10-CM

## 2022-12-03 DIAGNOSIS — M47816 Spondylosis without myelopathy or radiculopathy, lumbar region: Secondary | ICD-10-CM | POA: Diagnosis not present

## 2022-12-17 ENCOUNTER — Other Ambulatory Visit: Payer: Self-pay | Admitting: Nurse Practitioner

## 2022-12-30 ENCOUNTER — Other Ambulatory Visit: Payer: Self-pay | Admitting: Nurse Practitioner

## 2022-12-30 ENCOUNTER — Encounter: Payer: Self-pay | Admitting: Nurse Practitioner

## 2022-12-30 DIAGNOSIS — E039 Hypothyroidism, unspecified: Secondary | ICD-10-CM

## 2022-12-30 DIAGNOSIS — E785 Hyperlipidemia, unspecified: Secondary | ICD-10-CM

## 2022-12-30 MED ORDER — ROSUVASTATIN CALCIUM 20 MG PO TABS: 20.0000 mg | ORAL_TABLET | Freq: Every day | ORAL | 0 refills | Status: DC

## 2023-01-09 ENCOUNTER — Encounter: Payer: Self-pay | Admitting: Podiatry

## 2023-01-10 ENCOUNTER — Other Ambulatory Visit: Payer: Self-pay | Admitting: Podiatry

## 2023-01-10 ENCOUNTER — Ambulatory Visit: Payer: Medicaid Other | Admitting: Podiatry

## 2023-01-10 ENCOUNTER — Ambulatory Visit: Payer: Medicaid Other

## 2023-01-10 ENCOUNTER — Encounter: Payer: Self-pay | Admitting: Podiatry

## 2023-01-10 DIAGNOSIS — M792 Neuralgia and neuritis, unspecified: Secondary | ICD-10-CM

## 2023-01-10 DIAGNOSIS — R609 Edema, unspecified: Secondary | ICD-10-CM

## 2023-01-10 NOTE — Progress Notes (Signed)
  Subjective:  Patient ID: Lindsey Bowen, female    DOB: 19-Dec-1970,   MRN: [id number]  Chief Complaint  Patient presents with   Foot Pain    Right foot pain and swelling pt stated that she normally sees Dr Ardelle Anton but could not get in with him she stated that normally the swelling comes and goes but the swelling is not going down and when you press on the top of her foot she gets a sharp pain that goes all the way up her leg.    52 y.o. female presents for concern as above. Denies any injury. Relates the pain has been improving. Has been wearing compression stockings.  . Denies any other pedal complaints. Denies n/v/f/c.   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    Objective:  Physical Exam: Vascular: DP/PT pulses 2/4 bilateral. CFT <3 seconds. Normal hair growth on digits. Bilateral lower extremity edema. Some increased edema over the dorsum of the right foot Skin. No lacerations or abrasions bilateral feet.  Musculoskeletal: MMT 5/5 bilateral lower extremities in DF, PF, Inversion and Eversion. Deceased ROM in DF of ankle joint. Tender over mostly the third and fourth metatarsal and MPJ area. No pain with ROM of the lesser digits. Some irritation with palpation of the superficial peroneal nerve area.  Neurological: Sensation intact to light touch.   Assessment:   1. Swelling   2.  Neuritis      Plan:  Patient was evaluated and treated and all questions answered. -Xrays reviewed. No acute fractures or dislocation noted.  -Discussed treatement options for possible contusion of the foot vs neuritis from swelling; risks, alternatives, and benefits explained. -Continue comrpession.   -Recommend protection, rest, ice, elevation daily until symptoms improve -Antinflammatories as needed -Patient to return to office as needed   Louann Sjogren, DPM

## 2023-01-11 ENCOUNTER — Encounter: Payer: Self-pay | Admitting: Nurse Practitioner

## 2023-01-12 ENCOUNTER — Other Ambulatory Visit: Payer: Self-pay | Admitting: Nurse Practitioner

## 2023-01-12 DIAGNOSIS — E118 Type 2 diabetes mellitus with unspecified complications: Secondary | ICD-10-CM

## 2023-01-12 MED ORDER — DEXCOM G7 SENSOR MISC: 1 refills | Status: DC

## 2023-01-24 DIAGNOSIS — Z1231 Encounter for screening mammogram for malignant neoplasm of breast: Secondary | ICD-10-CM | POA: Diagnosis not present

## 2023-01-24 LAB — HM MAMMOGRAPHY

## 2023-01-25 ENCOUNTER — Other Ambulatory Visit: Payer: Self-pay

## 2023-01-25 ENCOUNTER — Ambulatory Visit: Payer: Medicaid Other | Attending: Nurse Practitioner | Admitting: Nurse Practitioner

## 2023-01-25 ENCOUNTER — Encounter: Payer: Self-pay | Admitting: Nurse Practitioner

## 2023-01-25 VITALS — BP 158/88 | HR 81 | Ht 65.0 in | Wt 248.0 lb

## 2023-01-25 DIAGNOSIS — E1165 Type 2 diabetes mellitus with hyperglycemia: Secondary | ICD-10-CM

## 2023-01-25 DIAGNOSIS — Z1231 Encounter for screening mammogram for malignant neoplasm of breast: Secondary | ICD-10-CM | POA: Diagnosis not present

## 2023-01-25 DIAGNOSIS — E039 Hypothyroidism, unspecified: Secondary | ICD-10-CM

## 2023-01-25 DIAGNOSIS — E785 Hyperlipidemia, unspecified: Secondary | ICD-10-CM | POA: Diagnosis not present

## 2023-01-25 DIAGNOSIS — I10 Essential (primary) hypertension: Secondary | ICD-10-CM | POA: Diagnosis not present

## 2023-01-25 DIAGNOSIS — E118 Type 2 diabetes mellitus with unspecified complications: Secondary | ICD-10-CM | POA: Diagnosis not present

## 2023-01-25 DIAGNOSIS — Z794 Long term (current) use of insulin: Secondary | ICD-10-CM | POA: Diagnosis not present

## 2023-01-25 LAB — POCT GLYCOSYLATED HEMOGLOBIN (HGB A1C): Hemoglobin A1C: 11.2 % — AB (ref 4.0–5.6)

## 2023-01-25 MED ORDER — INSULIN LISPRO (1 UNIT DIAL) 100 UNIT/ML (KWIKPEN): 5.0000 [IU] | PEN_INJECTOR | Freq: Three times a day (TID) | SUBCUTANEOUS | 11 refills | Status: DC

## 2023-01-25 MED ORDER — INSULIN GLARGINE 100 UNIT/ML SOLOSTAR PEN: 40.0000 [IU] | PEN_INJECTOR | Freq: Two times a day (BID) | SUBCUTANEOUS | 11 refills | Status: DC

## 2023-01-25 MED ORDER — VALSARTAN 320 MG PO TABS: 320.0000 mg | ORAL_TABLET | Freq: Every day | ORAL | 3 refills | Status: DC

## 2023-01-25 NOTE — Progress Notes (Signed)
Assessment & Plan:  Lindsey Bowen was seen today for diabetes.  Diagnoses and all orders for this visit:  Type 2 diabetes mellitus with hyperglycemia, with long-term current use of insulin (HCC) Dose changes.  Added Lantus and Humalog and discontinue Humulin -     POCT glycosylated hemoglobin (Hb A1C) -     insulin glargine (LANTUS) 100 UNIT/ML Solostar Pen; Inject 40 Units into the skin 2 (two) times daily. -     insulin lispro (HUMALOG KWIKPEN) 100 UNIT/ML KwikPen; Inject 5 Units into the skin 3 (three) times daily with meals. One hour after meal check blood sugar. For blood sugars 0-150 give 0 units of insulin, 151-200 give 2 units of insulin, 201-250 give 4 units, 251-300 give 6 units, 301-350 give 8 units, 351-400 give 10 units,> 400 give 12 units -     CMP14+EGFR  Breast cancer screening by mammogram -     Cancel: MM 3D SCREENING MAMMOGRAM BILATERAL BREAST; Future  Hypothyroidism, unspecified type -     Thyroid Panel With TSH  Hyperlipidemia LDL goal <70 -     Lipid panel  Primary hypertension Dose change increase valsartan from 160 mg to 320 -     valsartan (DIOVAN) 320 MG tablet; Take 1 tablet (320 mg total) by mouth daily.    Patient has been counseled on age-appropriate routine health concerns for screening and prevention. These are reviewed and up-to-date. Referrals have been placed accordingly. Immunizations are up-to-date or declined.    Subjective:   Chief Complaint  Patient presents with   Diabetes   HPI Lindsey Bowen 52 y.o. female presents to office today for follow up to DM, HTN and Hypothyroidism.  She has switched endocrinologists and now does not have an appt with the new office until January. 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     DM 2 A1c not at goal and has increased from 9.5-11.2.  States she could not tolerate Ozempic and Trulicity was not effective in reducing her blood glucose levels.  She was a previous patient of healthy weight and wellness but did not do well with weight loss there.  She endorses consistently administering 75-85 units of Humulin R  with breakfast and 65-75 units of insulin with lunch and dinner which I find difficult to understand as treatment as A1c is so high.  Lab Results  Component Value Date   HGBA1C 11.2 (A) 01/25/2023    Lab Results  Component Value Date   HGBA1C 9.5 (H) 10/12/2022    Hypertension Blood pressure is elevated today.  Her diet is not healthy.  She self increased her valsartan to 160 mg from 80 mg daily although she had been instructed to follow-up in May for blood pressure recheck and that I would not make any changes to her blood pressure medication until she was seen in office. She also has a history of OSA but never received her CPAP.  States she has been sleeping well although I have instructed her that a repeat sleep study still needs to be performed as her uncontrolled sleep apnea could be affecting her blood pressure.   BP Readings from Last 3 Encounters:  01/25/23 Marland Kitchen)  158/88  10/12/22 (!) 140/80  07/25/22 (!) 144/73   Hypothyroidism Denies any symptoms of hypo or hyperthyroidism and thyroid level is normal at this time with levothyroxine 137.5 mg on Sundays and 175 mg on Monday through Saturday Lab Results  Component Value Date   TSH 3.500 10/12/2022   T4TOTAL 8.4 10/12/2022     OSA Previous sleep study revealed positive for OSA 05-07-2019. She endorses excessive daytime sleepiness and frequent headaches. BMI 42. She does snore and  endorses apneic episodes.   Review of Systems  Constitutional:  Negative for fever, malaise/fatigue and weight loss.  HENT: Negative.  Negative for nosebleeds.   Eyes: Negative.  Negative for blurred vision, double vision and photophobia.  Respiratory: Negative.  Negative for cough and shortness of breath.   Cardiovascular: Negative.  Negative for chest pain, palpitations and leg swelling.  Gastrointestinal: Negative.  Negative for heartburn, nausea and vomiting.  Musculoskeletal: Negative.  Negative for myalgias.  Neurological: Negative.  Negative for dizziness, focal weakness, seizures and headaches.  Psychiatric/Behavioral: Negative.  Negative for suicidal ideas.     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    Past Surgical History:  Procedure Laterality Date   CHOLECYSTECTOMY     DILATION AND CURETTAGE OF UTERUS     HERNIA REPAIR      Family History  Problem Relation Age of Onset   Diabetes Mother    Hypertension Mother    Addison's disease Mother    Asthma Mother    Colon polyps Mother    Irritable bowel syndrome Mother    Glaucoma Father    Mental retardation Sister    Glaucoma Sister    Heart attack Maternal Aunt    Heart attack Maternal Uncle    Diabetes Paternal Uncle    Heart attack Maternal Grandmother    Stroke Maternal Grandmother    Ulcerative colitis Maternal  Grandfather    Diabetes Maternal Grandfather    Stomach cancer Maternal Grandfather    Diabetes Paternal Grandmother    Irritable bowel syndrome Daughter    Diabetes Son    Colon cancer Neg Hx    Esophageal cancer Neg Hx    Rectal cancer Neg Hx     Social History Reviewed with no changes to be made today.   Outpatient Medications Prior to Visit  Medication Sig Dispense Refill   acetaminophen (TYLENOL) 650 MG CR tablet Take 650 mg by mouth every 8 (eight) hours as needed for pain.     BD PEN NEEDLE NANO 2ND GEN 32G X 4 MM MISC ONE EACH BY OTHER ROUTE DAILY.     Blood Pressure Monitor DEVI Please provide patient with insurance approved blood pressure monitor ICD I 10.0 1 each 0  EPINEPHrine 0.3 mg/0.3 mL IJ SOAJ injection INJECT ONE SYRINGE (0.3 MG) INTO THE MUSCLE AS NEEDED FOR ANAPHYLAXIS. 2 mL 0   fluticasone (FLONASE) 50 MCG/ACT nasal spray Place 2 sprays into both nostrils daily as needed for allergies or rhinitis.     ibuprofen (ADVIL) 200 MG tablet Take 400 mg by mouth every 6 (six) hours as needed.     levocetirizine (XYZAL) 5 MG tablet Take 5 mg by mouth daily as needed.     levothyroxine (SYNTHROID) 175 MCG tablet TAKE 1/2 TABLET ON SUNDAY AND 1 TABLET MONDAY TO SATURDAY. 90 tablet 0   linaclotide (LINZESS) 72 MCG capsule Take 1 capsule (72 mcg total) by mouth daily before breakfast. 30 capsule 3   meloxicam (MOBIC) 15 MG tablet Take 15 mg by mouth daily as needed.     methocarbamol (ROBAXIN) 500 MG tablet Take 500 mg by mouth daily as needed for muscle spasms.     rosuvastatin (CRESTOR) 20 MG tablet Take 1 tablet (20 mg total) by mouth at bedtime. 90 tablet 0   sennosides-docusate sodium (SENOKOT-S) 8.6-50 MG tablet Take 2 tablets by mouth daily. 60 tablet 6   VENTOLIN HFA 108 (90 Base) MCG/ACT inhaler INHALE 1 TO 2 PUFFS INTO THE LUNGS EVERY 6 (SIX) HOURS AS NEEDED FOR WHEEZING OR SHORTNESS OF BREATH. 18 g 1   valsartan (DIOVAN) 80 MG tablet Take 1 tablet (80 mg total) by  mouth daily. 90 tablet 1   Continuous Glucose Sensor (DEXCOM G7 SENSOR) MISC Apply 1 every 10 days (Patient not taking: Reported on 01/25/2023) 9 each 1   furosemide (LASIX) 20 MG tablet Take 1 tablet (20 mg total) by mouth daily for 7 days. 7 tablet 0   Guaifenesin 1200 MG TB12 Take 1 tablet (1,200 mg total) by mouth 2 (two) times daily at 10 AM and 5 PM. (Patient not taking: Reported on 01/25/2023) 14 tablet 0   levonorgestrel (LILETTA, 52 MG,) 20.1 MCG/DAY IUD 1 each by Intrauterine route once. (Patient not taking: Reported on 01/25/2023)     Multiple Vitamin (MULTIVITAMIN) capsule Take 1 capsule by mouth daily. (Patient not taking: Reported on 01/25/2023)     Vitamin D, Ergocalciferol, (DRISDOL) 1.25 MG (50000 UNIT) CAPS capsule Take 1 capsule (50,000 Units total) by mouth every 7 (seven) days. (Patient not taking: Reported on 01/25/2023) 4 capsule 0   insulin regular human CONCENTRATED (HUMULIN R U-500 KWIKPEN) 500 UNIT/ML KwikPen Inject 65 units under the skin before breakfast and 45 units before lunch and dinner. (Patient not taking: Reported on 01/25/2023)     No facility-administered medications prior to visit.    Allergies  Allergen Reactions   Dust Mite Extract Hives and Itching   Shellfish Allergy Anaphylaxis       Objective:    BP (!) 158/88   Pulse 81   Ht 5\' 5"  (1.651 m)   Wt 248 lb (112.5 kg)   LMP 05/28/2022 (Approximate)   SpO2 97%   BMI 41.27 kg/m  Wt Readings from Last 3 Encounters:  01/25/23 248 lb (112.5 kg)  10/12/22 252 lb 12.8 oz (114.7 kg)  07/25/22 237 lb (107.5 kg)    Physical Exam Vitals and nursing note reviewed.  Constitutional:      Appearance: She is well-developed.  HENT:     Head: Normocephalic and atraumatic.  Cardiovascular:     Rate and Rhythm: Normal rate and regular rhythm.     Heart sounds: Normal heart sounds. No murmur heard.  No friction rub. No gallop.  Pulmonary:     Effort: Pulmonary effort is normal. No tachypnea or respiratory  distress.     Breath sounds: Normal breath sounds. No decreased breath sounds, wheezing, rhonchi or rales.  Chest:     Chest wall: No tenderness.  Abdominal:     General: Bowel sounds are normal.     Palpations: Abdomen is soft.  Musculoskeletal:        General: Normal range of motion.     Cervical back: Normal range of motion.  Skin:    General: Skin is warm and dry.  Neurological:     Mental Status: She is alert and oriented to person, place, and time.     Coordination: Coordination normal.  Psychiatric:        Behavior: Behavior normal. Behavior is cooperative.        Thought Content: Thought content normal.        Judgment: Judgment normal.          Patient has been counseled extensively about nutrition and exercise as well as the importance of adherence with medications and regular follow-up. The patient was given clear instructions to go to ER or return to medical center if symptoms don't improve, worsen or new problems develop. The patient verbalized understanding.   Follow-up: Return in about 4 weeks (around 02/22/2023) for BP/meterCHECK WITH LUKE. See me in 3 months.   Claiborne Rigg, FNP-BC [facility] [place], [place] [phone]   01/25/2023, 11:39 AM

## 2023-01-25 NOTE — Patient Instructions (Signed)
[place] [facility] Address: [address] [place], [place] [address] Phone: [phone]

## 2023-02-04 ENCOUNTER — Other Ambulatory Visit: Payer: Self-pay | Admitting: Nurse Practitioner

## 2023-02-04 ENCOUNTER — Ambulatory Visit: Payer: Medicaid Other | Admitting: Podiatry

## 2023-02-04 ENCOUNTER — Encounter: Payer: Self-pay | Admitting: Nurse Practitioner

## 2023-02-04 MED ORDER — BD PEN NEEDLE MINI U/F 31G X 5 MM MISC: 6 refills | Status: DC

## 2023-02-08 DIAGNOSIS — N6489 Other specified disorders of breast: Secondary | ICD-10-CM | POA: Diagnosis not present

## 2023-02-08 DIAGNOSIS — R928 Other abnormal and inconclusive findings on diagnostic imaging of breast: Secondary | ICD-10-CM | POA: Diagnosis not present

## 2023-02-08 LAB — HM MAMMOGRAPHY

## 2023-02-17 ENCOUNTER — Other Ambulatory Visit: Payer: Self-pay | Admitting: Family Medicine

## 2023-02-17 DIAGNOSIS — J302 Other seasonal allergic rhinitis: Secondary | ICD-10-CM

## 2023-02-22 ENCOUNTER — Encounter: Payer: Self-pay | Admitting: Pharmacist

## 2023-02-22 ENCOUNTER — Other Ambulatory Visit: Payer: Self-pay

## 2023-02-22 ENCOUNTER — Ambulatory Visit: Payer: Medicaid Other | Attending: Nurse Practitioner | Admitting: Pharmacist

## 2023-02-22 VITALS — BP 160/80

## 2023-02-22 DIAGNOSIS — E1165 Type 2 diabetes mellitus with hyperglycemia: Secondary | ICD-10-CM

## 2023-02-22 DIAGNOSIS — Z794 Long term (current) use of insulin: Secondary | ICD-10-CM | POA: Diagnosis not present

## 2023-02-22 DIAGNOSIS — I10 Essential (primary) hypertension: Secondary | ICD-10-CM

## 2023-02-22 MED ORDER — INSULIN LISPRO (1 UNIT DIAL) 100 UNIT/ML (KWIKPEN)
14.0000 [IU] | PEN_INJECTOR | Freq: Three times a day (TID) | SUBCUTANEOUS | 11 refills | Status: DC
  Filled 2023-02-22: qty 15, 36d supply, fill #0
  Filled 2023-03-28: qty 15, 36d supply, fill #1

## 2023-02-22 MED ORDER — TOUJEO MAX SOLOSTAR 300 UNIT/ML ~~LOC~~ SOPN
60.0000 [IU] | PEN_INJECTOR | Freq: Every day | SUBCUTANEOUS | 2 refills | Status: DC
  Filled 2023-02-22: qty 9, 45d supply, fill #0
  Filled 2023-03-28: qty 9, 45d supply, fill #1

## 2023-02-22 MED ORDER — TRULICITY 0.75 MG/0.5ML ~~LOC~~ SOAJ
0.7500 mg | SUBCUTANEOUS | 1 refills | Status: DC
  Filled 2023-02-22: qty 2, 28d supply, fill #0
  Filled 2023-03-28: qty 2, 28d supply, fill #1

## 2023-02-22 NOTE — Progress Notes (Signed)
S:     No chief complaint on file.  52 y.o. female who presents for diabetes evaluation, education, and management. Patient arrives in good spirits and presents without any assistance.   Patient was referred and last seen by Primary Care Provider, [name], on 01/25/2023. Bp at that visit was 158/88 mmHg. A1c was 11.2%. Pt was referred to me for further management.   PMH is significant for T2DM, HTN, TIA, OSA, hx of diabetic nephropathy (last UACR 124 10/12/2022), diabetic neuropathy, sciatica, chronic lower back pain.   Patient reports Diabetes was diagnosed in 2012. She has been followed by Endocrinology before, she even has experience with an OmniPod. Was on Trulicity but was stopped d/t her Endocrinologist deeming it ineffective. Additionally, she has intolerances to metformin and Ozempic. Has been on insulin now for some time. No hx of pancreatitis or thyroid cancer. She does have a hx of TIA but no CAD, CHF, or CKD. She does have a hx of microalbuminuria, with last UACR >30 (124 mg/g).   Regarding her BP, she denies any dizziness, HA, blurred vision, chest pain. She is taking the valsartan and took this morning. Of note, she works 3rd shift and has noticed an impact on the quality of her sleep. She takes her valsartan in the morning after she returns home from work.   Family/Social History:  Fhx: DM, HTN, Addison's, asthma, colon polyps (mother), MI (MGM, aunt), stroke (MGM) Tobacco: never smoker  Alcohol: no alcohol use   Current diabetes medications include: Lantus 40u BID, Humalog 5u TID before meals + SSI Current hypertension medications include: valsartan 320 mg daily  Current hyperlipidemia medications include: rosuvastatin 320 mg daily   Patient reports adherence to taking all medications as prescribed.   Insurance coverage: [place] [employer]  Patient denies hypoglycemic events.  Patient reports polyuria, polydipsia. Patient reports neuropathy (nerve pain). Patient  reports visual changes. Patient reports self foot exams.   Patient reported dietary habits:  -Admits that she has cut back on sodas, never really struggled with sweets   Patient-reported exercise habits:  -Limited d/t sciatica, chronic back pain    O:  Uses Dexcom. Blood sugar readings have been high she admits.   Lab Results  Component Value Date   HGBA1C 11.2 (A) 01/25/2023   Vitals:   02/22/23 1457  BP: (!) 160/80    Lipid Panel     Component Value Date/Time   CHOL 110 01/25/2023 0956   TRIG 95 01/25/2023 0956   HDL 40 01/25/2023 0956   CHOLHDL 2.8 01/25/2023 0956   CHOLHDL 3.1 05/27/2017 0426   VLDL 14 05/27/2017 0426   LDLCALC 52 01/25/2023 0956    Clinical Atherosclerotic Cardiovascular Disease (ASCVD): YES  The ASCVD Risk score (Arnett DK, et al., 2019) failed to calculate for the following reasons:   The valid total cholesterol range is 130 to 320 mg/dL   Patient is participating in a Managed Medicaid Plan:  Yes   A/P: Diabetes longstanding currently uncontrolled. Patient is able to verbalize appropriate hypoglycemia management plan. Medication adherence appears appropriate. Will start back on Trulicity. Change Lantus to Toujeo for better absorption. Increase Humalog to 12u TID before meals.  -Stop Lantus. Start Toujeo 60u daily in the morning. -Increase humalog to 14u TID before meals.  -Start Trulicity 0.75 mg weekly.  -Patient educated on purpose, proper use, and potential adverse effects of Toujeo, Trulicity, and Humalog.  -Extensively discussed pathophysiology of diabetes, recommended lifestyle interventions, dietary effects on blood sugar control.  -  Counseled on s/sx of and management of hypoglycemia.  -Next A1c anticipated 03/2023.   Hypertension longstanding currently uncontrolled. Blood pressure goal of <130/80 mmHg. Medication adherence reported. Change valsartan to dosing before work in the evenings. Will assess for modification of therapy next  visit.  -Continued valsartan 320 mg. Change to PM dosing.  Written patient instructions provided. Patient verbalized understanding of treatment plan.  Total time in face to face counseling 30 minutes.    Follow-up:  Pharmacist in 1 month  Butch Penny, PharmD, [place], CPP Clinical Pharmacist [facility] [phone]

## 2023-02-23 ENCOUNTER — Other Ambulatory Visit: Payer: Self-pay

## 2023-02-24 ENCOUNTER — Other Ambulatory Visit: Payer: Self-pay

## 2023-02-25 ENCOUNTER — Ambulatory Visit: Payer: Medicaid Other | Admitting: Pharmacist

## 2023-03-11 ENCOUNTER — Ambulatory Visit (INDEPENDENT_AMBULATORY_CARE_PROVIDER_SITE_OTHER): Payer: Medicaid Other | Admitting: Podiatry

## 2023-03-11 DIAGNOSIS — L600 Ingrowing nail: Secondary | ICD-10-CM | POA: Diagnosis not present

## 2023-03-11 MED ORDER — CEPHALEXIN 500 MG PO CAPS: 500.0000 mg | ORAL_CAPSULE | Freq: Three times a day (TID) | ORAL | 20 refills | Status: DC

## 2023-03-11 NOTE — Progress Notes (Signed)
Subjective: Chief Complaint  Patient presents with   Ingrown Toenail    Patient removed skin around the medial border of right hallux because it was a little sore. The area feels better.    Foot Swelling    Patient stated she has foot swelling and soreness. She is wearing her compression socks and stretching sometimes. She believes the weather is causing her swelling.    52 year old female presents the office today mostly for concerns of ingrown toenail to right big toenail, medial aspect.  She still was sore but she trimmed a piece of nail out herself and since has not been hurting.  No drainage or pus or any redness that she reports.  She does get swelling to her feet and she wears compression socks.  Somewhat of a chronic issue.  No injuries that she reports.  A1c is 11.2.  Objective: AAO x3, NAD DP/PT pulses palpable bilaterally, CRT less than 3 seconds The right hallux toenail medial aspect is ingrown and there is local edema and some faint erythema think more from inflammation as opposed to infection.  There is no drainage or pus identified at this time.  Nails is high-pressure, dystrophic. There is mild edema present to bilateral lower extremities but there is no erythema or warmth.  No erythema or tenderness. No pain with calf compression, swelling, warmth, erythema  Assessment: Ingrown toenail right hallux, swelling  Plan: -All treatment options discussed with the patient including all alternatives, risks, complications.  -We discussed partial nail avulsion however her symptoms are improving not having any significant pain.  Although she is diabetic this is put her at increased risk of complications and I do not want to use the sodium hydroxide because of her increased A1c.  Will hold open partial nail avulsion to the extremity with the corner with any complications or bleeding.  Prescribed cephalexin.  Should symptoms worsen or recur we will likely proceed with partial nail  avulsion. -Compression socks, elevation for swelling -Patient encouraged to call the office with any questions, concerns, change in symptoms.   Vivi Barrack DPM

## 2023-03-11 NOTE — Patient Instructions (Signed)
If you notice any increase in swelling, redness or any signs of infection please let me know immediately. If the symptoms have not resolved in the next 2 weeks, let me know  --  Diabetes Mellitus and Foot Care Diabetes, also called diabetes mellitus, may cause problems with your feet and legs because of poor blood flow (circulation). Poor circulation may make your skin: Become thinner and drier. Break more easily. Heal more slowly. Peel and crack. You may also have nerve damage (neuropathy). This can cause decreased feeling in your legs and feet. This means that you may not notice minor injuries to your feet that could lead to more serious problems. Finding and treating problems early is the best way to prevent future foot problems. How to care for your feet Foot hygiene  Wash your feet daily with warm water and mild soap. Do not use hot water. Then, pat your feet and the areas between your toes until they are fully dry. Do not soak your feet. This can dry your skin. Trim your toenails straight across. Do not dig under them or around the cuticle. File the edges of your nails with an emery board or nail file. Apply a moisturizing lotion or petroleum jelly to the skin on your feet and to dry, brittle toenails. Use lotion that does not contain alcohol and is unscented. Do not apply lotion between your toes. Shoes and socks Wear clean socks or stockings every day. Make sure they are not too tight. Do not wear knee-high stockings. These may decrease blood flow to your legs. Wear shoes that fit well and have enough cushioning. Always look in your shoes before you put them on to be sure there are no objects inside. To break in new shoes, wear them for just a few hours a day. This prevents injuries on your feet. Wounds, scrapes, corns, and calluses  Check your feet daily for blisters, cuts, bruises, sores, and redness. If you cannot see the bottom of your feet, use a mirror or ask someone for  help. Do not cut off corns or calluses or try to remove them with medicine. If you find a minor scrape, cut, or break in the skin on your feet, keep it and the skin around it clean and dry. You may clean these areas with mild soap and water. Do not clean the area with peroxide, alcohol, or iodine. If you have a wound, scrape, corn, or callus on your foot, look at it several times a day to make sure it is healing and not infected. Check for: Redness, swelling, or pain. Fluid or blood. Warmth. Pus or a bad smell. General tips Do not cross your legs. This may decrease blood flow to your feet. Do not use heating pads or hot water bottles on your feet. They may burn your skin. If you have lost feeling in your feet or legs, you may not know this is happening until it is too late. Protect your feet from hot and cold by wearing shoes, such as at the beach or on hot pavement. Schedule a complete foot exam at least once a year or more often if you have foot problems. Report any cuts, sores, or bruises to your health care provider right away. Where to find more information American Diabetes Association: diabetes.org Association of Diabetes Care & Education Specialists: diabeteseducator.org Contact a health care provider if: You have a condition that increases your risk of infection, and you have any cuts, sores, or bruises on your  feet. You have an injury that is not healing. You have redness on your legs or feet. You feel burning or tingling in your legs or feet. You have pain or cramps in your legs and feet. Your legs or feet are numb. Your feet always feel cold. You have pain around any toenails. Get help right away if: You have a wound, scrape, corn, or callus on your foot and: You have signs of infection. You have a fever. You have a red line going up your leg. This information is not intended to replace advice given to you by your health care provider. Make sure you discuss any questions you  have with your health care provider. Document Revised: 12/16/2021 Document Reviewed: 12/16/2021 Elsevier Patient Education  2024 [employer].

## 2023-03-18 ENCOUNTER — Ambulatory Visit: Payer: Medicaid Other | Admitting: Podiatry

## 2023-03-28 ENCOUNTER — Encounter: Payer: Self-pay | Admitting: Nurse Practitioner

## 2023-03-29 ENCOUNTER — Encounter: Payer: Self-pay | Admitting: Nurse Practitioner

## 2023-03-29 ENCOUNTER — Encounter: Payer: Self-pay | Admitting: Pharmacist

## 2023-03-29 ENCOUNTER — Ambulatory Visit: Payer: Medicaid Other | Attending: Family Medicine | Admitting: Pharmacist

## 2023-03-29 ENCOUNTER — Other Ambulatory Visit: Payer: Self-pay

## 2023-03-29 VITALS — BP 148/82

## 2023-03-29 DIAGNOSIS — I10 Essential (primary) hypertension: Secondary | ICD-10-CM

## 2023-03-29 DIAGNOSIS — Z7985 Long-term (current) use of injectable non-insulin antidiabetic drugs: Secondary | ICD-10-CM | POA: Diagnosis not present

## 2023-03-29 DIAGNOSIS — Z794 Long term (current) use of insulin: Secondary | ICD-10-CM | POA: Diagnosis not present

## 2023-03-29 DIAGNOSIS — E1165 Type 2 diabetes mellitus with hyperglycemia: Secondary | ICD-10-CM

## 2023-03-29 MED ORDER — CHLORTHALIDONE 25 MG PO TABS
12.5000 mg | ORAL_TABLET | Freq: Every day | ORAL | 1 refills | Status: DC
  Filled 2023-03-29: qty 45, 90d supply, fill #0
  Filled 2023-07-15: qty 45, 90d supply, fill #1

## 2023-03-29 MED ORDER — INSULIN LISPRO (1 UNIT DIAL) 100 UNIT/ML (KWIKPEN)
18.0000 [IU] | PEN_INJECTOR | Freq: Three times a day (TID) | SUBCUTANEOUS | 1 refills | Status: DC
  Filled 2023-03-29: qty 15, 28d supply, fill #0
  Filled 2023-03-29: qty 45, 84d supply, fill #0
  Filled 2023-04-27: qty 15, 28d supply, fill #1

## 2023-03-29 MED ORDER — TRULICITY 1.5 MG/0.5ML ~~LOC~~ SOAJ
1.5000 mg | SUBCUTANEOUS | 1 refills | Status: DC
  Filled 2023-03-29: qty 2, 28d supply, fill #0
  Filled 2023-04-27: qty 2, 28d supply, fill #1

## 2023-03-29 NOTE — Progress Notes (Signed)
S:     No chief complaint on file.  52 y.o. female who presents for diabetes and HTN evaluation, education, and management. Patient arrives in good spirits and presents without any assistance.   Patient was referred and last seen by Primary Care Provider, [name], on 01/25/2023. Bp at that visit was 158/88 mmHg. A1c was 11.2%. Pt was referred to me for further management. I last saw her on 02/22/2023. We changed her valsartan to PM dosing and changed her Lantus to Toujeo. We also added Trulicity.   PMH is significant for T2DM, HTN, TIA, OSA, hx of diabetic nephropathy (last UACR 124 10/12/2022), diabetic neuropathy, sciatica, chronic lower back pain.   Today, patient reports in good spirits. Since last visit, she started the Toujeo and Trulicity. Denies any NV, abdominal pain. No changes in vision. She has completed 4 injections of Trulicity. She endorses a decreased appetite. No issues with Toujeo. She continues to take Humalog. She has an upcoming Endo appt 07/29/23 but plans to check with [place] [facility] to see if she can make a sooner appt.   Regarding her BP, she denies any dizziness, HA, blurred vision, chest pain. She feels better since taking the valsartan in the evening. She does not have any BP readings from home.   Family/Social History:  Fhx: DM, HTN, Addison's, asthma, colon polyps (mother), MI (MGM, aunt), stroke (MGM) Tobacco: never smoker  Alcohol: no alcohol use   Current diabetes medications include: Toujeo 60u daily, Humalog 14u TID before meals, Trulicity 0.75 mg weekly  Current hypertension medications include: valsartan 320 mg daily  Current hyperlipidemia medications include: rosuvastatin 320 mg daily   Patient reports adherence to taking all medications as prescribed.   Insurance coverage: [place] Medicaid  Patient denies hypoglycemic events.  Patient reports polyuria, polydipsia. Patient reports neuropathy (nerve pain). Patient reports visual changes. Patient  reports self foot exams.   Patient reported dietary habits:  -Admits that she has cut back on sodas, never really struggled with sweets   Patient-reported exercise habits:  -Limited d/t sciatica, chronic back pain    O:  Uses Dexcom. Blood sugar readings have been high she admits.   Lab Results  Component Value Date   HGBA1C 11.2 (A) 01/25/2023   Vitals:   03/29/23 1413  BP: (!) 148/82     Lipid Panel     Component Value Date/Time   CHOL 110 01/25/2023 0956   TRIG 95 01/25/2023 0956   HDL 40 01/25/2023 0956   CHOLHDL 2.8 01/25/2023 0956   CHOLHDL 3.1 05/27/2017 0426   VLDL 14 05/27/2017 0426   LDLCALC 52 01/25/2023 0956    Clinical Atherosclerotic Cardiovascular Disease (ASCVD): YES  The ASCVD Risk score (Arnett DK, et al., 2019) failed to calculate for the following reasons:   The valid total cholesterol range is 130 to 320 mg/dL   Patient is participating in a Managed Medicaid Plan:  Yes   A/P: Diabetes longstanding currently uncontrolled. Patient is able to verbalize appropriate hypoglycemia management plan. Medication adherence appears appropriate. Will start back on Trulicity. Change Lantus to Toujeo for better absorption. Increase Humalog to 12u TID before meals.  -Continue Toujeo 60u daily in the morning. -Increase humalog to 18u TID before meals. Pt instructed to further increase to 20u TID after 2 weeks if blood sugars continue to be high.  -Increase Trulicity to 1.5 mg weekly.  -Patient educated on purpose, proper use, and potential adverse effects of Toujeo, Trulicity, and Humalog.  -Extensively discussed pathophysiology  of diabetes, recommended lifestyle interventions, dietary effects on blood sugar control.  -Counseled on s/sx of and management of hypoglycemia.  -Next A1c anticipated 03/2023.   Hypertension longstanding currently uncontrolled. Blood pressure goal of <130/80 mmHg. Medication adherence reported. -Continued valsartan 320 mg. Change to PM  dosing. -Add chlorthalidone 12.5 mg daily. Starting with a low dose d/t hx of mild hypokalemia. Will get labs at follow-up.  Written patient instructions provided. Patient verbalized understanding of treatment plan.  Total time in face to face counseling 30 minutes.    Follow-up:  Pharmacist in 2 months. PCP next month.   Butch Penny, PharmD, P[place]y [place], CPP Clinical Pharmacist [facility] [phone]

## 2023-03-30 ENCOUNTER — Other Ambulatory Visit: Payer: Self-pay | Admitting: Family Medicine

## 2023-03-31 NOTE — Telephone Encounter (Signed)
Requested Prescriptions  Pending Prescriptions Disp Refills   levothyroxine (SYNTHROID) 175 MCG tablet [Pharmacy Med Name: LEVOTHYROXINE SODIUM 175 MCG ORAL TABLET] 90 tablet 0    Sig: TAKE 1/2 TABLET ON SUNDAY AND 1 TABLET MONDAY TO SATURDAY.     Endocrinology:  Hypothyroid Agents Passed - 03/30/2023 10:23 AM      Passed - TSH in normal range and within 360 days    TSH  Date Value Ref Range Status  01/25/2023 3.610 0.450 - 4.500 uIU/mL Final         Passed - Valid encounter within last 12 months    Recent Outpatient Visits           2 days ago Long term (current) use of insulin (HCC)   [place] [facility] [place], Cornelius Moras, RPH-CPP   1 month ago Primary hypertension   [place] [facility] [place], Claremore L, RPH-CPP   2 months ago Type 2 diabetes mellitus with hyperglycemia, with long-term current use of insulin [facility])   [place] [facility] [place], Shea Stakes, NP   5 months ago Encounter for annual physical exam   [facility] [place], Shea Stakes, NP   1 year ago Primary hypertension   [place] [facility] [place], Shea Stakes, NP       Future Appointments             In 4 weeks Claiborne Rigg, NP [facility]   In 1 month [place], MD [facility]   In 2 months Drucilla Chalet, RPH-CPP [place] [facility]

## 2023-04-27 ENCOUNTER — Other Ambulatory Visit: Payer: Self-pay | Admitting: Family Medicine

## 2023-04-29 ENCOUNTER — Ambulatory Visit: Payer: Self-pay | Admitting: Nurse Practitioner

## 2023-05-01 MED ORDER — LEVOTHYROXINE SODIUM 175 MCG PO TABS
175.0000 ug | ORAL_TABLET | Freq: Every day | ORAL | 0 refills | Status: DC
  Filled 2023-05-01: qty 90, 90d supply, fill #0

## 2023-05-02 ENCOUNTER — Ambulatory Visit: Payer: Medicaid Other | Admitting: "Endocrinology

## 2023-05-02 ENCOUNTER — Other Ambulatory Visit: Payer: Self-pay

## 2023-05-02 ENCOUNTER — Encounter: Payer: Self-pay | Admitting: "Endocrinology

## 2023-05-02 VITALS — BP 122/82 | HR 78 | Ht 65.0 in | Wt 233.4 lb

## 2023-05-02 DIAGNOSIS — Z7985 Long-term (current) use of injectable non-insulin antidiabetic drugs: Secondary | ICD-10-CM

## 2023-05-02 DIAGNOSIS — E7800 Pure hypercholesterolemia, unspecified: Secondary | ICD-10-CM

## 2023-05-02 DIAGNOSIS — E1165 Type 2 diabetes mellitus with hyperglycemia: Secondary | ICD-10-CM | POA: Diagnosis not present

## 2023-05-02 DIAGNOSIS — Z794 Long term (current) use of insulin: Secondary | ICD-10-CM | POA: Diagnosis not present

## 2023-05-02 LAB — POCT GLYCOSYLATED HEMOGLOBIN (HGB A1C): Hemoglobin A1C: 12.3 % — AB (ref 4.0–5.6)

## 2023-05-02 MED ORDER — INSULIN LISPRO (1 UNIT DIAL) 100 UNIT/ML (KWIKPEN)
20.0000 [IU] | PEN_INJECTOR | Freq: Three times a day (TID) | SUBCUTANEOUS | 1 refills | Status: DC
  Filled 2023-05-02 (×2): qty 54, 90d supply, fill #0

## 2023-05-02 MED ORDER — TOUJEO MAX SOLOSTAR 300 UNIT/ML ~~LOC~~ SOPN
70.0000 [IU] | PEN_INJECTOR | Freq: Every day | SUBCUTANEOUS | 1 refills | Status: DC
Start: 2023-05-02 — End: 2023-07-22
  Filled 2023-05-02: qty 21, 90d supply, fill #0

## 2023-05-02 MED ORDER — TRULICITY 3 MG/0.5ML ~~LOC~~ SOAJ
3.0000 mg | SUBCUTANEOUS | 3 refills | Status: DC
Start: 2023-05-02 — End: 2023-06-10
  Filled 2023-05-02: qty 2, 28d supply, fill #0
  Filled 2023-05-26 – 2023-05-30 (×2): qty 2, 28d supply, fill #1

## 2023-05-02 NOTE — Patient Instructions (Signed)

## 2023-05-02 NOTE — Progress Notes (Signed)
Outpatient Endocrinology Note Lindsey Brooklet, MD  05/02/23   Lindsey Bowen [birth year] [id number]  Referring Provider: Claiborne Rigg, NP Primary Care Provider: Claiborne Rigg, NP Reason for consultation: Subjective   Assessment & Plan  Diagnoses and all orders for this visit:  Uncontrolled type 2 diabetes mellitus with hyperglycemia (HCC) -     POCT glycosylated hemoglobin (Hb A1C) -     Dulaglutide (TRULICITY) 3 MG/0.5ML SOAJ; Inject 3 mg as directed once a week. -     insulin lispro (HUMALOG KWIKPEN) 100 UNIT/ML KwikPen; Inject 20 Units into the skin 3 (three) times daily with meals.  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, TIA, No results found for: "GFR" Hba1c goal less than 7, current Hba1c is  Lab Results  Component Value Date   HGBA1C 12.3 (A) 05/02/2023   Will recommend the following: Toujeo 65 units followed by 70 units q day in few days Humalog 20 units 2-3 times a day, 15 min before meals (last took a few days ago) Trulicity 3 mg weekly  Sees podiatry Dr Vivi Barrack   No known contraindications/side effects to any of above medications No history of MEN syndrome/medullary thyroid cancer/pancreatitis or pancreatic cancer in self or family  -Last LD and Tg are as follows: Lab Results  Component Value Date   LDLCALC 52 01/25/2023    Lab Results  Component Value Date   TRIG 95 01/25/2023   -On rosuvastatin 20 mg QD -Follow low fat diet and exercise   -Blood pressure goal <140/90 - Microalbumin/creatinine goal is < 30 -Last MA/Cr is as follows: No results found for: "MICROALBUR", "MALB24HUR" -on ACE/ARB valsartanb 320 gm qd -diet changes including salt restriction -limit eating outside -counseled  BP targets per standards of diabetes care -uncontrolled blood pressure can lead to retinopathy, nephropathy and cardiovascular and atherosclerotic heart disease  Reviewed and counseled on: -A1C target -Blood sugar targets -Complications of uncontrolled diabetes  -Checking blood sugar before meals and bedtime and bring log next visit -All medications with mechanism of action and side effects -Hypoglycemia management: rule of 15's, Glucagon Emergency Kit and medical alert ID -low-carb low-fat plate-method diet -At least 20 minutes of physical activity per day -Annual dilated retinal eye exam and foot exam -compliance and follow up needs -follow up as scheduled or earlier if problem gets worse  Call if blood sugar is less than 70 or consistently above 250    Take a 15 gm snack of carbohydrate at bedtime before you go to sleep if your blood sugar is less than 100.    If you are going to fast after midnight for a test or procedure, ask your physician for instructions on how to reduce/decrease your insulin dose.    Call if blood sugar is less than 70 or consistently above 250  -Treating a low sugar by rule of 15  (15 gms of sugar every 15 min until sugar is more than 70) If you feel your sugar is low, test your sugar to be sure If your sugar is low (less than 70), then take 15 grams of a fast acting Carbohydrate (3-4 glucose tablets or glucose gel or 4 ounces of juice or regular soda) Recheck your sugar 15 min after treating low to make sure it is  more than 70 If sugar is still less than 70, treat again with 15 grams of carbohydrate          Don't drive the hour of hypoglycemia  If unconscious/unable to eat or drink by mouth, use glucagon injection or nasal spray baqsimi and call 911. Can repeat again in 15 min if still unconscious.  Return in about 4 weeks (around 05/30/2023).   I have reviewed current medications, nurse's notes, allergies, vital signs, past medical and surgical history,  family medical history, and social history for this encounter. Counseled patient on symptoms, examination findings, lab findings, imaging results, treatment decisions and monitoring and prognosis. The patient understood the recommendations and agrees with the treatment plan. All questions regarding treatment plan were fully answered.  Lindsey Astoria, MD  05/02/23   History of Present Illness Lindsey Bowen is a 52 y.o. year old female who presents for evaluation of Type II diabetes mellitus.  Lindsey Bowen was first diagnosed in 2012.   Diabetes education +  Home diabetes regimen: Toujeo 60 units qam Humalog 20 units 2-3 times a day, 15 min before meals  Trulicity 1.5 mg weekly  COMPLICATIONS +  MI/Stroke/TIA -  retinopathy -  neuropathy +  nephropathy  SYMPTOMS REVIEWED + Polyuria - Weight loss - Blurred vision  BLOOD SUGAR DATA  CGM interpretation: At today's visit, we reviewed her CGM downloads. The full report is scanned in the media. Reviewing the CGM trends, BG are elevated across the day.   Physical Exam  BP 122/82   Pulse 78   Ht 5\' 5"  (1.651 m)   Wt 233 lb 6.4 oz (105.9 kg)   SpO2 99%   BMI 38.84 kg/m    Constitutional: well developed, well nourished Head: normocephalic, atraumatic Eyes: sclera anicteric, no redness Neck: supple Lungs: normal respiratory effort Neurology: alert and oriented Skin: dry, no appreciable rashes Musculoskeletal: no appreciable defects Psychiatric: normal mood and affect Diabetic Foot Exam - Simple   No data filed      Current Medications Patient's Medications  New Prescriptions   DULAGLUTIDE (TRULICITY) 3 MG/0.5ML SOAJ    Inject 3 mg as directed once a week.  Previous Medications   ACETAMINOPHEN (TYLENOL) 650 MG CR TABLET    Take 650 mg by mouth every 8 (eight) hours as needed for pain.   BD PEN NEEDLE NANO 2ND GEN 32G X 4 MM MISC    ONE EACH BY OTHER ROUTE DAILY.   BLOOD PRESSURE MONITOR DEVI    Please provide  patient with insurance approved blood pressure monitor ICD I 10.0   CEPHALEXIN (KEFLEX) 500 MG CAPSULE    Take 1 capsule (500 mg total) by mouth 3 (three) times daily.   CHLORTHALIDONE (HYGROTON) 25 MG TABLET    Take 0.5 tablets (12.5 mg total) by mouth daily.   CONTINUOUS GLUCOSE SENSOR (DEXCOM G7 SENSOR) MISC    Apply 1 every 10 days   EPINEPHRINE 0.3 MG/0.3 ML IJ SOAJ INJECTION    INJECT ONE SYRINGE (0.3 MG) INTO THE MUSCLE AS NEEDED FOR ANAPHYLAXIS.   FLUTICASONE (FLONASE) 50 MCG/ACT NASAL SPRAY    Place 2 sprays into both nostrils daily as needed for allergies or rhinitis.   GUAIFENESIN 1200 MG TB12    Take 1 tablet (1,200 mg total) by mouth 2 (two) times daily at 10 AM and 5 PM.   IBUPROFEN (ADVIL) 200 MG TABLET    Take 400 mg by mouth every 6 (six) hours as needed.   INSULIN GLARGINE,  2 UNIT DIAL, (TOUJEO MAX SOLOSTAR) 300 UNIT/ML SOLOSTAR PEN    Inject 60 Units into the skin daily.   INSULIN PEN NEEDLE (B-D UF III MINI PEN NEEDLES) 31G X 5 MM MISC    Use as instructed. Check blood glucose level by fingerstick 5-6 TIMES per day.   LEVOCETIRIZINE (XYZAL) 5 MG TABLET    Take 5 mg by mouth daily as needed.   LEVONORGESTREL (LILETTA, 52 MG,) 20.1 MCG/DAY IUD    1 each by Intrauterine route once.   LEVOTHYROXINE (SYNTHROID) 175 MCG TABLET    Take 1 tablet (175 mcg total) by mouth daily before breakfast.   LINACLOTIDE (LINZESS) 72 MCG CAPSULE    Take 1 capsule (72 mcg total) by mouth daily before breakfast.   MELOXICAM (MOBIC) 15 MG TABLET    Take 15 mg by mouth daily as needed.   METHOCARBAMOL (ROBAXIN) 500 MG TABLET    Take 500 mg by mouth daily as needed for muscle spasms.   MULTIPLE VITAMIN (MULTIVITAMIN) CAPSULE    Take 1 capsule by mouth daily.   ROSUVASTATIN (CRESTOR) 20 MG TABLET    Take 1 tablet (20 mg total) by mouth at bedtime.   SENNOSIDES-DOCUSATE SODIUM (SENOKOT-S) 8.6-50 MG TABLET    Take 2 tablets by mouth daily.   VALSARTAN (DIOVAN) 320 MG TABLET    Take 1 tablet (320 mg total)  by mouth daily.   VENTOLIN HFA 108 (90 BASE) MCG/ACT INHALER    INHALE 1 TO 2 PUFFS INTO THE LUNGS EVERY 6 (SIX) HOURS AS NEEDED FOR WHEEZING OR SHORTNESS OF BREATH.   VITAMIN D, ERGOCALCIFEROL, (DRISDOL) 1.25 MG (50000 UNIT) CAPS CAPSULE    Take 1 capsule (50,000 Units total) by mouth every 7 (seven) days.  Modified Medications   Modified Medication Previous Medication   INSULIN LISPRO (HUMALOG KWIKPEN) 100 UNIT/ML KWIKPEN insulin lispro (HUMALOG KWIKPEN) 100 UNIT/ML KwikPen      Inject 20 Units into the skin 3 (three) times daily with meals.    Inject 18 Units into the skin 3 (three) times daily with meals.  Discontinued Medications   DULAGLUTIDE (TRULICITY) 1.5 MG/0.5ML SOPN    Inject 1.5 mg into the skin once a week.    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    Past Surgical History Past Surgical History:  Procedure Laterality Date   CHOLECYSTECTOMY     DILATION AND CURETTAGE OF UTERUS     HERNIA REPAIR      Family History family history includes Addison's disease in her mother; Asthma in her mother; Colon polyps in her mother; Diabetes in  her maternal grandfather, mother, paternal grandmother, paternal uncle, and son; Glaucoma in her father and  sister; Heart attack in her maternal aunt, maternal grandmother, and maternal uncle; Hypertension in her mother; Irritable bowel syndrome in her daughter and mother; Mental retardation in her sister; Stomach cancer in her maternal grandfather; Stroke in her maternal grandmother; Ulcerative colitis in her maternal grandfather.  Social History Social History   Socioeconomic History   Marital status: Single    Spouse name: Not on file   Number of children: 3   Years of education: Not on file   Highest education level: Some college, no degree  Occupational History   Occupation: Counselling psychologist: [employer]    Comment: Works nights  Tobacco Use   Smoking status: Never   Smokeless tobacco: Never  Vaping Use   Vaping status: Never Used  Substance and Sexual Activity   Alcohol use: No   Drug use: Not Currently   Sexual activity: Yes    Partners: Male    Birth control/protection: I.U.D.    Comment: Liletta  Other Topics Concern   Not on file  Social History Narrative   Lives    Caffeine use:    Social Determinants of Health   Financial Resource Strain: Medium Risk (05/02/2023)   Overall Financial Resource Strain (CARDIA)    Difficulty of Paying Living Expenses: Somewhat hard  Food Insecurity: Food Insecurity Present (05/02/2023)   Hunger Vital Sign    Worried About Running Out of Food in the Last Year: Often true    Ran Out of Food in the Last Year: Sometimes true  Transportation Needs: No Transportation Needs (05/02/2023)   PRAPARE - Administrator, Civil Service (Medical): No    Lack of Transportation (Non-Medical): No  Physical Activity: Unknown (05/02/2023)   Exercise Vital Sign    Days of Exercise per Week: 0 days    Minutes of Exercise per Session: Not on file  Stress: Stress Concern Present (05/02/2023)   [employer] of Occupational Health - Occupational Stress Questionnaire    Feeling of Stress : To some extent  Social Connections:  Moderately Isolated (05/02/2023)   Social Connection and Isolation Panel [NHANES]    Frequency of Communication with Friends and Family: More than three times a week    Frequency of Social Gatherings with Friends and Family: Never    Attends Religious Services: Never    Database administrator or Organizations: No    Attends Engineer, structural: More than 4 times per year    Marital Status: Never married  Intimate Partner Violence: Not At Risk (08/30/2022)   Received from [facility]   HITS    Over the last 12 months how often did your partner physically hurt you?: 1    Over the last 12 months how often did your partner insult you or talk down to you?: 1    Over the last 12 months how often did your partner threaten you with physical harm?: 1    Over the last 12 months how often did your partner scream or curse at you?: 1    Lab Results  Component Value Date   HGBA1C 12.3 (A) 05/02/2023   HGBA1C 11.2 (A) 01/25/2023   HGBA1C 9.5 (H) 10/12/2022   Lab Results  Component Value Date   CHOL 110 01/25/2023   Lab Results  Component Value Date   HDL 40 01/25/2023   Lab Results  Component  Value Date   LDLCALC 52 01/25/2023   Lab Results  Component Value Date   TRIG 95 01/25/2023   Lab Results  Component Value Date   CHOLHDL 2.8 01/25/2023   Lab Results  Component Value Date   CREATININE 0.89 01/25/2023   No results found for: "GFR" No results found for: "MICROALBUR", "MALB24HUR"    Component Value Date/Time   NA 138 01/25/2023 0956   K 3.8 01/25/2023 0956   CL 98 01/25/2023 0956   CO2 23 01/25/2023 0956   GLUCOSE 338 (H) 01/25/2023 0956   GLUCOSE 224 (H) 05/26/2017 1355   BUN 11 01/25/2023 0956   CREATININE 0.89 01/25/2023 0956   CALCIUM 9.4 01/25/2023 0956   PROT 6.3 01/25/2023 0956   ALBUMIN 4.1 01/25/2023 0956   AST 20 01/25/2023 0956   ALT 25 01/25/2023 0956   ALKPHOS 95 01/25/2023 0956   BILITOT 0.4 01/25/2023 0956   GFRNONAA 61  03/28/2020 1702   GFRAA 71 03/28/2020 1702      Latest Ref Rng & Units 01/25/2023    9:56 AM 10/12/2022   11:27 AM 11/11/2021    4:34 PM  BMP  Glucose 70 - 99 mg/dL 433  295  90   BUN 6 - 24 mg/dL 11  10  9    Creatinine 0.57 - 1.00 mg/dL 1.88  4.16  6.06   BUN/Creat Ratio 9 - 23 12  10  10    Sodium 134 - 144 mmol/L 138  140  142   Potassium 3.5 - 5.2 mmol/L 3.8  3.3  3.7   Chloride 96 - 106 mmol/L 98  102  102   CO2 20 - 29 mmol/L 23  26  27    Calcium 8.7 - 10.2 mg/dL 9.4  9.4  9.8        Component Value Date/Time   WBC 7.0 10/12/2022 1127   WBC 7.4 01/29/2021 0953   RBC 5.26 10/12/2022 1127   RBC 5.08 01/29/2021 0953   HGB 12.2 10/12/2022 1127   HCT 39.3 10/12/2022 1127   PLT 274 10/12/2022 1127   MCV 75 (L) 10/12/2022 1127   MCH 23.2 (L) 10/12/2022 1127   MCH 22.8 (L) 05/27/2017 0426   MCHC 31.0 (L) 10/12/2022 1127   MCHC 31.8 01/29/2021 0953   RDW 14.2 10/12/2022 1127   LYMPHSABS 2.8 10/12/2022 1127   MONOABS 0.4 01/29/2021 0953   EOSABS 0.2 10/12/2022 1127   BASOSABS 0.0 10/12/2022 1127     Parts of this note may have been dictated using voice recognition software. There may be variances in spelling and vocabulary which are unintentional. Not all errors are proofread. Please notify the Thereasa Parkin if any discrepancies are noted or if the meaning of any statement is not clear.

## 2023-05-02 NOTE — Addendum Note (Signed)
Addended byAltamese Humphreys on: 05/02/2023 02:25 PM   Modules accepted: Orders

## 2023-05-03 ENCOUNTER — Other Ambulatory Visit: Payer: Self-pay

## 2023-05-04 ENCOUNTER — Other Ambulatory Visit: Payer: Self-pay

## 2023-05-04 ENCOUNTER — Ambulatory Visit: Payer: Medicaid Other | Admitting: Nurse Practitioner

## 2023-05-05 ENCOUNTER — Other Ambulatory Visit: Payer: Self-pay

## 2023-05-09 ENCOUNTER — Other Ambulatory Visit: Payer: Self-pay

## 2023-05-09 LAB — HM DIABETES EYE EXAM

## 2023-05-12 ENCOUNTER — Other Ambulatory Visit: Payer: Self-pay

## 2023-05-13 ENCOUNTER — Other Ambulatory Visit: Payer: Self-pay | Admitting: Nurse Practitioner

## 2023-05-13 DIAGNOSIS — E785 Hyperlipidemia, unspecified: Secondary | ICD-10-CM

## 2023-05-24 ENCOUNTER — Ambulatory Visit: Payer: Medicaid Other | Admitting: Obstetrics and Gynecology

## 2023-05-25 ENCOUNTER — Encounter: Payer: Self-pay | Admitting: Nurse Practitioner

## 2023-05-25 ENCOUNTER — Ambulatory Visit: Payer: Medicaid Other | Attending: Nurse Practitioner | Admitting: Nurse Practitioner

## 2023-05-25 VITALS — BP 129/84 | HR 89 | Ht 65.0 in | Wt 232.6 lb

## 2023-05-25 DIAGNOSIS — E039 Hypothyroidism, unspecified: Secondary | ICD-10-CM | POA: Diagnosis not present

## 2023-05-25 DIAGNOSIS — I152 Hypertension secondary to endocrine disorders: Secondary | ICD-10-CM

## 2023-05-25 DIAGNOSIS — B3731 Acute candidiasis of vulva and vagina: Secondary | ICD-10-CM

## 2023-05-25 DIAGNOSIS — E1159 Type 2 diabetes mellitus with other circulatory complications: Secondary | ICD-10-CM

## 2023-05-25 DIAGNOSIS — Z7985 Long-term (current) use of injectable non-insulin antidiabetic drugs: Secondary | ICD-10-CM | POA: Diagnosis not present

## 2023-05-25 DIAGNOSIS — Z7984 Long term (current) use of oral hypoglycemic drugs: Secondary | ICD-10-CM

## 2023-05-25 MED ORDER — LEVOTHYROXINE SODIUM 175 MCG PO TABS
175.0000 ug | ORAL_TABLET | Freq: Every day | ORAL | 1 refills | Status: DC
Start: 2023-05-25 — End: 2023-08-29

## 2023-05-25 MED ORDER — FLUCONAZOLE 150 MG PO TABS: 150.0000 mg | ORAL_TABLET | ORAL | 0 refills | Status: DC

## 2023-05-25 NOTE — Progress Notes (Signed)
Assessment & Plan:  Lindsey Bowen was seen today for medical management of chronic issues.  Diagnoses and all orders for this visit:  Hypertension associated with type 2 diabetes mellitus  Blood pressure is well controlled -     CMP14+EGFR  Acquired hypothyroidism -     levothyroxine (SYNTHROID) 175 MCG tablet; Take 1 tablet (175 mcg total) by mouth daily before breakfast. -     Thyroid Panel With TSH  Yeast vaginitis -     fluconazole (DIFLUCAN) 150 MG tablet; Take 1 tablet (150 mg total) by mouth every 3 (three) days.    Patient has been counseled on age-appropriate routine health concerns for screening and prevention. These are reviewed and up-to-date. Referrals have been placed accordingly. Immunizations are up-to-date or declined.    Subjective:   Chief Complaint  Patient presents with   Medical Management of Chronic Issues    Lindsey Bowen 52 y.o. female presents to office today for follow up to HTN. 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   States she was diagnosed with cataracts recently. She is currently seeing Dr. Dione Booze.   HTN She is taking chlorthalidone 25 mg daily and valsartan 320 mg daily as prescribed. Blood pressure is well controlled today.  BP Readings from Last 3 Encounters:  05/25/23 129/84  05/02/23 122/82  03/29/23 (!) 148/82     GU She has recurrent  symptoms of yeast vaginitis. A1c is elevated as well.  Lab Results  Component Value Date   HGBA1C 12.3 (A) 05/02/2023     Review of Systems   Constitutional:  Negative for fever, malaise/fatigue and weight loss.  HENT: Negative.  Negative for nosebleeds.   Eyes: Negative.  Negative for blurred vision, double vision and photophobia.  Respiratory: Negative.  Negative for cough and shortness of breath.   Cardiovascular: Negative.  Negative for chest pain, palpitations and leg swelling.  Gastrointestinal: Negative.  Negative for heartburn, nausea and vomiting.  Genitourinary:        Vaginitis  Musculoskeletal: Negative.  Negative for myalgias.  Neurological: Negative.  Negative for dizziness, focal weakness, seizures and headaches.  Psychiatric/Behavioral: Negative.  Negative for suicidal ideas.     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    Past Surgical History:  Procedure Laterality Date   CHOLECYSTECTOMY     DILATION AND CURETTAGE OF UTERUS     HERNIA REPAIR  Family History  Problem Relation Age of Onset   Diabetes Mother    Hypertension Mother    Addison's disease Mother    Asthma Mother    Colon polyps Mother    Irritable bowel syndrome Mother    Glaucoma Father    Mental retardation Sister    Glaucoma Sister    Heart attack Maternal Aunt    Heart attack Maternal Uncle    Diabetes Paternal Uncle    Heart attack Maternal Grandmother    Stroke Maternal Grandmother    Ulcerative colitis Maternal Grandfather     Diabetes Maternal Grandfather    Stomach cancer Maternal Grandfather    Diabetes Paternal Grandmother    Irritable bowel syndrome Daughter    Diabetes Son    Colon cancer Neg Hx    Esophageal cancer Neg Hx    Rectal cancer Neg Hx     Social History Reviewed with no changes to be made today.   Outpatient Medications Prior to Visit  Medication Sig Dispense Refill   acetaminophen (TYLENOL) 650 MG CR tablet Take 650 mg by mouth every 8 (eight) hours as needed for pain.     Blood Pressure Monitor DEVI Please provide patient with insurance approved blood pressure monitor ICD I 10.0 1 each 0   chlorthalidone (HYGROTON) 25 MG tablet Take 0.5 tablets (12.5 mg total) by mouth daily. 45 tablet 1   Continuous Glucose Sensor (DEXCOM G7 SENSOR) MISC Apply 1 every 10 days 9 each 1   Dulaglutide (TRULICITY) 3 MG/0.5ML SOAJ Inject 3 mg as directed once a week. 2 mL 3   EPINEPHrine 0.3 mg/0.3 mL IJ SOAJ injection INJECT ONE SYRINGE (0.3 MG) INTO THE MUSCLE AS NEEDED FOR ANAPHYLAXIS. 2 mL 0   fluticasone (FLONASE) 50 MCG/ACT nasal spray Place 2 sprays into both nostrils daily as needed for allergies or rhinitis.     ibuprofen (ADVIL) 200 MG tablet Take 400 mg by mouth every 6 (six) hours as needed.     insulin glargine, 2 Unit Dial, (TOUJEO MAX SOLOSTAR) 300 UNIT/ML Solostar Pen Inject 70 Units into the skin daily. 21 mL 1   insulin lispro (HUMALOG KWIKPEN) 100 UNIT/ML KwikPen Inject 20 Units into the skin 3 (three) times daily with meals. 54 mL 1   Insulin Pen Needle (B-D UF III MINI PEN NEEDLES) 31G X 5 MM MISC Use as instructed. Check blood glucose level by fingerstick 5-6 TIMES per day. 200 each 6   levocetirizine (XYZAL) 5 MG tablet Take 5 mg by mouth daily as needed.     levonorgestrel (LILETTA, 52 MG,) 20.1 MCG/DAY IUD 1 each by Intrauterine route once.     linaclotide (LINZESS) 72 MCG capsule Take 1 capsule (72 mcg total) by mouth daily before breakfast. 30 capsule 3   methocarbamol (ROBAXIN) 500  MG tablet Take 500 mg by mouth daily as needed for muscle spasms.     rosuvastatin (CRESTOR) 20 MG tablet TAKE 1 TABLET (20 MG TOTAL) BY MOUTH AT BEDTIME. 90 tablet 0   sennosides-docusate sodium (SENOKOT-S) 8.6-50 MG tablet Take 2 tablets by mouth daily. 60 tablet 6   valsartan (DIOVAN) 320 MG tablet Take 1 tablet (320 mg total) by mouth daily. 90 tablet 3   VENTOLIN HFA 108 (90 Base) MCG/ACT inhaler INHALE 1 TO 2 PUFFS INTO THE LUNGS EVERY 6 (SIX) HOURS AS NEEDED FOR WHEEZING OR SHORTNESS OF BREATH. 54 g 1   Vitamin D, Ergocalciferol, (DRISDOL) 1.25 MG (50000 UNIT) CAPS capsule Take 1 capsule (  50,000 Units total) by mouth every 7 (seven) days. (Patient not taking: Reported on 01/25/2023) 4 capsule 0   BD PEN NEEDLE NANO 2ND GEN 32G X 4 MM MISC ONE EACH BY OTHER ROUTE DAILY.     cephALEXin (KEFLEX) 500 MG capsule Take 1 capsule (500 mg total) by mouth 3 (three) times daily. (Patient not taking: Reported on 05/02/2023) 21 capsule 20   Guaifenesin 1200 MG TB12 Take 1 tablet (1,200 mg total) by mouth 2 (two) times daily at 10 AM and 5 PM. (Patient not taking: Reported on 01/25/2023) 14 tablet 0   levothyroxine (SYNTHROID) 175 MCG tablet Take 1 tablet (175 mcg total) by mouth daily before breakfast. 90 tablet 0   meloxicam (MOBIC) 15 MG tablet Take 15 mg by mouth daily as needed. (Patient not taking: Reported on 05/02/2023)     Multiple Vitamin (MULTIVITAMIN) capsule Take 1 capsule by mouth daily. (Patient not taking: Reported on 01/25/2023)     No facility-administered medications prior to visit.    Allergies  Allergen Reactions   Dust Mite Extract Hives and Itching   Shellfish Allergy Anaphylaxis       Objective:    BP 129/84 (BP Location: Left Arm, Patient Position: Sitting, Cuff Size: Large)   Pulse 89   Ht 5\' 5"  (1.651 m)   Wt 232 lb 9.6 oz (105.5 kg)   LMP  (LMP Unknown)   SpO2 99%   BMI 38.71 kg/m  Wt Readings from Last 3 Encounters:  05/25/23 232 lb 9.6 oz (105.5 kg)  05/02/23 233  lb 6.4 oz (105.9 kg)  01/25/23 248 lb (112.5 kg)    Physical Exam Vitals and nursing note reviewed.  Constitutional:      Appearance: She is well-developed.  HENT:     Head: Normocephalic and atraumatic.  Cardiovascular:     Rate and Rhythm: Normal rate and regular rhythm.     Heart sounds: Normal heart sounds. No murmur heard.    No friction rub. No gallop.  Pulmonary:     Effort: Pulmonary effort is normal. No tachypnea or respiratory distress.     Breath sounds: Normal breath sounds. No decreased breath sounds, wheezing, rhonchi or rales.  Chest:     Chest wall: No tenderness.  Abdominal:     General: Bowel sounds are normal.     Palpations: Abdomen is soft.  Musculoskeletal:        General: Normal range of motion.     Cervical back: Normal range of motion.  Skin:    General: Skin is warm and dry.  Neurological:     Mental Status: She is alert and oriented to person, place, and time.     Coordination: Coordination normal.  Psychiatric:        Behavior: Behavior normal. Behavior is cooperative.        Thought Content: Thought content normal.        Judgment: Judgment normal.          Patient has been counseled extensively about nutrition and exercise as well as the importance of adherence with medications and regular follow-up. The patient was given clear instructions to go to ER or return to medical center if symptoms don't improve, worsen or new problems develop. The patient verbalized understanding.   Follow-up: Return in about 3 months (around 08/25/2023).   Claiborne Rigg, FNP-BC [facility] [place], [place] [phone]   05/25/2023, 9:58 AM

## 2023-05-26 LAB — CMP14+EGFR
ALT: 19 [IU]/L (ref 0–32)
AST: 15 [IU]/L (ref 0–40)
Albumin: 4.2 g/dL (ref 3.9–4.9)
Alkaline Phosphatase: 91 [IU]/L (ref 44–121)
BUN/Creatinine Ratio: 16 (ref 9–23)
BUN: 20 mg/dL (ref 6–24)
Bilirubin Total: 0.4 mg/dL (ref 0.0–1.2)
CO2: 29 mmol/L (ref 20–29)
Calcium: 9.5 mg/dL (ref 8.7–10.2)
Chloride: 98 mmol/L (ref 96–106)
Creatinine, Ser: 1.28 mg/dL — ABNORMAL HIGH (ref 0.57–1.00)
Globulin, Total: 2.4 g/dL (ref 1.5–4.5)
Glucose: 280 mg/dL — ABNORMAL HIGH (ref 70–99)
Potassium: 3.4 mmol/L — ABNORMAL LOW (ref 3.5–5.2)
Sodium: 140 mmol/L (ref 134–144)
Total Protein: 6.6 g/dL (ref 6.0–8.5)
eGFR: 51 mL/min/{1.73_m2} — ABNORMAL LOW (ref >59)

## 2023-05-26 LAB — THYROID PANEL WITH TSH
Free Thyroxine Index: 2.7 (ref 1.2–4.9)
T3 Uptake Ratio: 28 % (ref 24–39)
T4, Total: 9.8 ug/dL (ref 4.5–12.0)
TSH: 0.624 u[IU]/mL (ref 0.450–4.500)

## 2023-05-27 ENCOUNTER — Other Ambulatory Visit: Payer: Self-pay

## 2023-05-29 NOTE — Progress Notes (Signed)
Error   Patient no show

## 2023-05-30 ENCOUNTER — Other Ambulatory Visit: Payer: Self-pay

## 2023-05-30 ENCOUNTER — Ambulatory Visit (HOSPITAL_BASED_OUTPATIENT_CLINIC_OR_DEPARTMENT_OTHER): Payer: Medicaid Other | Admitting: Pharmacist

## 2023-05-30 DIAGNOSIS — E1159 Type 2 diabetes mellitus with other circulatory complications: Secondary | ICD-10-CM

## 2023-05-30 DIAGNOSIS — M9905 Segmental and somatic dysfunction of pelvic region: Secondary | ICD-10-CM | POA: Diagnosis not present

## 2023-05-30 DIAGNOSIS — Z7985 Long-term (current) use of injectable non-insulin antidiabetic drugs: Secondary | ICD-10-CM

## 2023-05-30 DIAGNOSIS — Z794 Long term (current) use of insulin: Secondary | ICD-10-CM

## 2023-05-30 DIAGNOSIS — H5213 Myopia, bilateral: Secondary | ICD-10-CM | POA: Diagnosis not present

## 2023-05-30 DIAGNOSIS — I152 Hypertension secondary to endocrine disorders: Secondary | ICD-10-CM

## 2023-05-30 DIAGNOSIS — M9903 Segmental and somatic dysfunction of lumbar region: Secondary | ICD-10-CM | POA: Diagnosis not present

## 2023-05-30 DIAGNOSIS — M5386 Other specified dorsopathies, lumbar region: Secondary | ICD-10-CM | POA: Diagnosis not present

## 2023-05-30 DIAGNOSIS — E1165 Type 2 diabetes mellitus with hyperglycemia: Secondary | ICD-10-CM

## 2023-05-30 DIAGNOSIS — M9904 Segmental and somatic dysfunction of sacral region: Secondary | ICD-10-CM | POA: Diagnosis not present

## 2023-06-02 DIAGNOSIS — M9904 Segmental and somatic dysfunction of sacral region: Secondary | ICD-10-CM | POA: Diagnosis not present

## 2023-06-02 DIAGNOSIS — M9903 Segmental and somatic dysfunction of lumbar region: Secondary | ICD-10-CM | POA: Diagnosis not present

## 2023-06-02 DIAGNOSIS — M5386 Other specified dorsopathies, lumbar region: Secondary | ICD-10-CM | POA: Diagnosis not present

## 2023-06-02 DIAGNOSIS — M9905 Segmental and somatic dysfunction of pelvic region: Secondary | ICD-10-CM | POA: Diagnosis not present

## 2023-06-03 ENCOUNTER — Encounter: Payer: Self-pay | Admitting: "Endocrinology

## 2023-06-03 ENCOUNTER — Ambulatory Visit: Payer: Medicaid Other | Admitting: "Endocrinology

## 2023-06-03 VITALS — BP 102/72 | HR 56 | Ht 65.0 in | Wt 233.4 lb

## 2023-06-03 DIAGNOSIS — E1165 Type 2 diabetes mellitus with hyperglycemia: Secondary | ICD-10-CM

## 2023-06-03 DIAGNOSIS — E7800 Pure hypercholesterolemia, unspecified: Secondary | ICD-10-CM

## 2023-06-03 DIAGNOSIS — Z7985 Long-term (current) use of injectable non-insulin antidiabetic drugs: Secondary | ICD-10-CM | POA: Diagnosis not present

## 2023-06-03 DIAGNOSIS — Z794 Long term (current) use of insulin: Secondary | ICD-10-CM

## 2023-06-03 NOTE — Progress Notes (Signed)
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, TIA, No results found for: "GFR" Hba1c goal less than 7, current Hba1c is  Lab Results  Component Value Date   HGBA1C 12.3 (A) 05/02/2023   Will recommend the following: Toujeo 80 units at bedtime  Humalog 25 units 2-3 times a day, 15 min before meals  Trulicity 3 mg weekly-has some nausea   Jardiance gave UTI Couldn't tolerate metformin-tried different versions/doses for a year   Sees podiatry Dr Lindsey Bowen   No known contraindications/side effects to any of above medications No history of MEN syndrome/medullary thyroid cancer/pancreatitis or pancreatic cancer in self or family  -Last LD and Tg are as follows: Lab Results  Component Value Date   LDLCALC 52 01/25/2023    Lab Results  Component Value Date   TRIG 95 01/25/2023   -On rosuvastatin 20 mg QD -Follow low fat diet and exercise   -Blood pressure goal <140/90 - Microalbumin/creatinine goal is < 30 -Last MA/Cr is as follows: No results found for: "MICROALBUR", "MALB24HUR" -on ACE/ARB valsartanb 320 mg qd -diet changes including salt restriction -limit eating outside -counseled BP targets per standards of diabetes care -uncontrolled blood pressure can lead to retinopathy, nephropathy and cardiovascular and atherosclerotic heart disease  Reviewed and counseled  on: -A1C target -Blood sugar targets -Complications of uncontrolled diabetes  -Checking blood sugar before meals and bedtime and bring log next visit -All medications with mechanism of action and side effects -Hypoglycemia management: rule of 15's, Glucagon Emergency Kit and medical alert ID -low-carb low-fat plate-method diet -At least 20 minutes of physical activity per day -Annual dilated retinal eye exam and foot exam -compliance and follow up needs -follow up as scheduled or earlier if problem gets worse  Call if blood sugar is less than 70 or consistently above 250    Take a 15 gm snack of carbohydrate at bedtime before you go to sleep if your blood sugar is less than 100.    If you are going to fast after midnight for a test or procedure, ask your physician for instructions on how to reduce/decrease your insulin dose.    Call if blood sugar is less than 70 or consistently above 250  -Treating a low sugar by rule of 15  (15 gms of sugar every 15 min until sugar is more than 70) If you feel your sugar is low, test your sugar to be sure If your sugar is low (less than 70), then take 15 grams of a fast acting Carbohydrate (3-4 glucose tablets or glucose gel or 4 ounces of juice or regular soda) Recheck your sugar 15 min after treating low to make sure it is more than 70 If sugar is still less than 70, treat again with 15 grams of carbohydrate          Don't drive the hour of hypoglycemia  If unconscious/unable to eat or drink by mouth, use glucagon injection or nasal  spray baqsimi and call 911. Can repeat again in 15 min if still unconscious.  Return in about 1 week (around 06/10/2023) for visit.   I have reviewed current medications, nurse's notes, allergies, vital signs, past medical and surgical history, family medical history, and social history for this encounter. Counseled patient on symptoms, examination findings, lab findings, imaging results, treatment decisions and  monitoring and prognosis. The patient understood the recommendations and agrees with the treatment plan. All questions regarding treatment plan were fully answered.  Lindsey Orwin, MD  06/03/23   History of Present Illness Lindsey Bowen is a 52 y.o. year old female who presents for evaluation of Type II diabetes mellitus.  Lindsey Bowen was first diagnosed in 2012.   Diabetes education +  Home diabetes regimen: Toujeo 70 units every day  Humalog 20 units 2-3 times a day, 15 min before meals  Trulicity 3 mg weekly  COMPLICATIONS +  MI/Stroke/TIA -  retinopathy -  neuropathy +  nephropathy  SYMPTOMS REVIEWED - Polyuria - Weight loss - Blurred vision  BLOOD SUGAR DATA  CGM interpretation: At today's visit, we reviewed her CGM downloads. The full report is scanned in the media. Reviewing the CGM trends, BG are elevated across the day.   Physical Exam  BP 102/72   Pulse (!) 56   Ht 5\' 5"  (1.651 m)   Wt 233 lb 6.4 oz (105.9 kg)   LMP  (LMP Unknown)   SpO2 98%   BMI 38.84 kg/m    Constitutional: well developed, well nourished Head: normocephalic, atraumatic Eyes: sclera anicteric, no redness Neck: supple Lungs: normal respiratory effort Neurology: alert and oriented Skin: dry, no appreciable rashes Musculoskeletal: no appreciable defects Psychiatric: normal mood and affect Diabetic Foot Exam - Simple   No data filed      Current Medications Patient's Medications  New Prescriptions   No medications on file  Previous Medications   ACETAMINOPHEN (TYLENOL) 650 MG CR TABLET    Take 650 mg by mouth every 8 (eight) hours as needed for pain.   BLOOD PRESSURE MONITOR DEVI    Please provide patient with insurance approved blood pressure monitor ICD I 10.0   CHLORTHALIDONE (HYGROTON) 25 MG TABLET    Take 0.5 tablets (12.5 mg total) by mouth daily.   CONTINUOUS GLUCOSE SENSOR (DEXCOM G7 SENSOR) MISC    Apply 1 every 10 days   DULAGLUTIDE (TRULICITY) 3 MG/0.5ML SOAJ     Inject 3 mg as directed once a week.   EPINEPHRINE 0.3 MG/0.3 ML IJ SOAJ INJECTION    INJECT ONE SYRINGE (0.3 MG) INTO THE MUSCLE AS NEEDED FOR ANAPHYLAXIS.   FLUCONAZOLE (DIFLUCAN) 150 MG TABLET    Take 1 tablet (150 mg total) by mouth every 3 (three) days.   FLUTICASONE (FLONASE) 50 MCG/ACT NASAL SPRAY    Place 2 sprays into both nostrils daily as needed for allergies or rhinitis.   IBUPROFEN (ADVIL) 200 MG TABLET    Take 400 mg by mouth every 6 (six) hours as needed.   INSULIN GLARGINE, 2 UNIT DIAL, (TOUJEO MAX SOLOSTAR) 300 UNIT/ML SOLOSTAR PEN    Inject 70 Units into the skin daily.   INSULIN LISPRO (HUMALOG KWIKPEN) 100 UNIT/ML KWIKPEN    Inject 20 Units into the skin 3 (three) times daily with meals.   INSULIN PEN NEEDLE (B-D UF III MINI PEN NEEDLES) 31G X 5 MM MISC    Use as instructed. Check blood glucose level by fingerstick 5-6 TIMES per day.  LEVOCETIRIZINE (XYZAL) 5 MG TABLET    Take 5 mg by mouth daily as needed.   LEVONORGESTREL (LILETTA, 52 MG,) 20.1 MCG/DAY IUD    1 each by Intrauterine route once.   LEVOTHYROXINE (SYNTHROID) 175 MCG TABLET    Take 1 tablet (175 mcg total) by mouth daily before breakfast.   LINACLOTIDE (LINZESS) 72 MCG CAPSULE    Take 1 capsule (72 mcg total) by mouth daily before breakfast.   METHOCARBAMOL (ROBAXIN) 500 MG TABLET    Take 500 mg by mouth daily as needed for muscle spasms.   ROSUVASTATIN (CRESTOR) 20 MG TABLET    TAKE 1 TABLET (20 MG TOTAL) BY MOUTH AT BEDTIME.   SENNOSIDES-DOCUSATE SODIUM (SENOKOT-S) 8.6-50 MG TABLET    Take 2 tablets by mouth daily.   VALSARTAN (DIOVAN) 320 MG TABLET    Take 1 tablet (320 mg total) by mouth daily.   VENTOLIN HFA 108 (90 BASE) MCG/ACT INHALER    INHALE 1 TO 2 PUFFS INTO THE LUNGS EVERY 6 (SIX) HOURS AS NEEDED FOR WHEEZING OR SHORTNESS OF BREATH.   VITAMIN D, ERGOCALCIFEROL, (DRISDOL) 1.25 MG (50000 UNIT) CAPS CAPSULE    Take 1 capsule (50,000 Units total) by mouth every 7 (seven) days.  Modified Medications   No  medications on file  Discontinued Medications   No medications on file    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    Past Surgical History Past Surgical History:  Procedure Laterality Date   CHOLECYSTECTOMY     DILATION AND CURETTAGE OF UTERUS     HERNIA REPAIR      Family History family history includes Addison's disease in her mother; Asthma in her mother; Colon polyps in her mother; Diabetes in her maternal grandfather, mother, paternal grandmother, paternal uncle, and son; Glaucoma in her father and sister; Heart attack in her maternal aunt, maternal grandmother, and maternal uncle; Hypertension in her mother; Irritable bowel syndrome in her daughter and mother; Mental retardation in her sister; Stomach cancer in her maternal grandfather; Stroke in her maternal grandmother; Ulcerative colitis in her maternal grandfather.  Social History Social History   Socioeconomic History   Marital status: Single    Spouse name: Not on file   Number of children: 3   Years of education: Not on file   Highest education level: Some college, no degree  Occupational History   Occupation: Counselling psychologist: [employer]    Comment: Works nights   Tobacco Use   Smoking status: Never   Smokeless tobacco: Never  Vaping Use   Vaping status: Never  Used  Substance and Sexual Activity   Alcohol use: No   Drug use: Not Currently   Sexual activity: Yes    Partners: Male    Birth control/protection: I.U.D.    Comment: Liletta  Other Topics Concern   Not on file  Social History Narrative   Lives    Caffeine use:    Social Determinants of Health   Financial Resource Strain: Medium Risk (05/02/2023)   Overall Financial Resource Strain (CARDIA)    Difficulty of Paying Living Expenses: Somewhat hard  Food Insecurity: Food Insecurity Present (05/02/2023)   Hunger Vital Sign    Worried About Running Out of Food in the Last Year: Often true    Ran Out of Food in the Last Year: Sometimes true  Transportation Needs: No Transportation Needs (05/02/2023)   PRAPARE - Administrator, Civil Service (Medical): No    Lack of Transportation (Non-Medical): No  Physical Activity: Unknown (05/02/2023)   Exercise Vital Sign    Days of Exercise per Week: 0 days    Minutes of Exercise per Session: Not on file  Stress: Stress Concern Present (05/02/2023)   [employer] of Occupational Health - Occupational Stress Questionnaire    Feeling of Stress : To some extent  Social Connections: Moderately Isolated (05/02/2023)   Social Connection and Isolation Panel [NHANES]    Frequency of Communication with Friends and Family: More than three times a week    Frequency of Social Gatherings with Friends and Family: Never    Attends Religious Services: Never    Database administrator or Organizations: No    Attends Engineer, structural: More than 4 times per year    Marital Status: Never married  Intimate Partner Violence: Not At Risk (08/30/2022)   [facility]   HITS    Over the last 12 months how often did your partner physically hurt you?: Never    Over the last 12 months how often did your partner  insult you or talk down to you?: Never    Over the last 12 months how often did your partner threaten you with physical harm?: Never    Over the last 12 months how often did your partner scream or curse at you?: Never    Lab Results  Component Value Date   HGBA1C 12.3 (A) 05/02/2023   HGBA1C 11.2 (A) 01/25/2023   HGBA1C 9.5 (H) 10/12/2022   Lab Results  Component Value Date   CHOL 110 01/25/2023   Lab Results  Component Value Date   HDL 40 01/25/2023   Lab Results  Component Value Date   LDLCALC 52 01/25/2023   Lab Results  Component Value Date   TRIG 95 01/25/2023   Lab Results  Component Value Date   CHOLHDL 2.8 01/25/2023   Lab Results  Component Value Date   CREATININE 1.28 (H) 05/25/2023   No results found for: "GFR" No results found for: "MICROALBUR", "MALB24HUR"    Component Value Date/Time   NA 140 05/25/2023 1003   K 3.4 (L) 05/25/2023 1003   CL 98 05/25/2023 1003   CO2 29 05/25/2023 1003   GLUCOSE 280 (H) 05/25/2023 1003   GLUCOSE 224 (H) 05/26/2017 1355   BUN 20 05/25/2023 1003   CREATININE 1.28 (H) 05/25/2023 1003   CALCIUM 9.5 05/25/2023 1003   PROT 6.6 05/25/2023 1003   ALBUMIN 4.2 05/25/2023 1003   AST 15 05/25/2023 1003   ALT 19 05/25/2023 1003  ALKPHOS 91 05/25/2023 1003   BILITOT 0.4 05/25/2023 1003   GFRNONAA 61 03/28/2020 1702   GFRAA 71 03/28/2020 1702      Latest Ref Rng & Units 05/25/2023   10:03 AM 01/25/2023    9:56 AM 10/12/2022   11:27 AM  BMP  Glucose 70 - 99 mg/dL 329  518  841   BUN 6 - 24 mg/dL 20  11  10    Creatinine 0.57 - 1.00 mg/dL 6.60  6.30  1.60   BUN/Creat Ratio 9 - 23 16  12  10    Sodium 134 - 144 mmol/L 140  138  140   Potassium 3.5 - 5.2 mmol/L 3.4  3.8  3.3   Chloride 96 - 106 mmol/L 98  98  102   CO2 20 - 29 mmol/L 29  23  26    Calcium 8.7 - 10.2 mg/dL 9.5  9.4  9.4        Component Value Date/Time   WBC 7.0 10/12/2022 1127   WBC 7.4 01/29/2021 0953   RBC 5.26 10/12/2022 1127   RBC 5.08 01/29/2021  0953   HGB 12.2 10/12/2022 1127   HCT 39.3 10/12/2022 1127   PLT 274 10/12/2022 1127   MCV 75 (L) 10/12/2022 1127   MCH 23.2 (L) 10/12/2022 1127   MCH 22.8 (L) 05/27/2017 0426   MCHC 31.0 (L) 10/12/2022 1127   MCHC 31.8 01/29/2021 0953   RDW 14.2 10/12/2022 1127   LYMPHSABS 2.8 10/12/2022 1127   MONOABS 0.4 01/29/2021 0953   EOSABS 0.2 10/12/2022 1127   BASOSABS 0.0 10/12/2022 1127     Parts of this note may have been dictated using voice recognition software. There may be variances in spelling and vocabulary which are unintentional. Not all errors are proofread. Please notify the Thereasa Parkin if any discrepancies are noted or if the meaning of any statement is not clear.

## 2023-06-03 NOTE — Patient Instructions (Signed)

## 2023-06-07 ENCOUNTER — Other Ambulatory Visit: Payer: Self-pay

## 2023-06-08 DIAGNOSIS — M5386 Other specified dorsopathies, lumbar region: Secondary | ICD-10-CM | POA: Diagnosis not present

## 2023-06-08 DIAGNOSIS — M9903 Segmental and somatic dysfunction of lumbar region: Secondary | ICD-10-CM | POA: Diagnosis not present

## 2023-06-08 DIAGNOSIS — M9905 Segmental and somatic dysfunction of pelvic region: Secondary | ICD-10-CM | POA: Diagnosis not present

## 2023-06-08 DIAGNOSIS — M9904 Segmental and somatic dysfunction of sacral region: Secondary | ICD-10-CM | POA: Diagnosis not present

## 2023-06-10 ENCOUNTER — Other Ambulatory Visit: Payer: Self-pay

## 2023-06-10 ENCOUNTER — Encounter: Payer: Self-pay | Admitting: "Endocrinology

## 2023-06-10 ENCOUNTER — Ambulatory Visit: Payer: Medicaid Other | Admitting: "Endocrinology

## 2023-06-10 ENCOUNTER — Telehealth: Payer: Self-pay | Admitting: Nurse Practitioner

## 2023-06-10 VITALS — BP 120/74 | HR 95 | Ht 65.0 in | Wt 235.8 lb

## 2023-06-10 DIAGNOSIS — E7800 Pure hypercholesterolemia, unspecified: Secondary | ICD-10-CM

## 2023-06-10 DIAGNOSIS — Z7985 Long-term (current) use of injectable non-insulin antidiabetic drugs: Secondary | ICD-10-CM

## 2023-06-10 DIAGNOSIS — E1165 Type 2 diabetes mellitus with hyperglycemia: Secondary | ICD-10-CM | POA: Diagnosis not present

## 2023-06-10 DIAGNOSIS — Z794 Long term (current) use of insulin: Secondary | ICD-10-CM

## 2023-06-10 MED ORDER — BAQSIMI ONE PACK 3 MG/DOSE NA POWD
1.0000 | NASAL | 3 refills | Status: AC | PRN
  Filled 2023-06-10: qty 1, 34d supply, fill #0
  Filled 2023-07-15 – 2023-10-23 (×2): qty 1, 34d supply, fill #1
  Filled 2023-11-26: qty 1, 34d supply, fill #2
  Filled 2024-05-04: qty 1, 34d supply, fill #3

## 2023-06-10 MED ORDER — TRULICITY 4.5 MG/0.5ML ~~LOC~~ SOAJ
4.5000 mg | SUBCUTANEOUS | 1 refills | Status: DC
  Filled 2023-06-10: qty 2, 28d supply, fill #0
  Filled 2023-07-15: qty 2, 28d supply, fill #1
  Filled 2023-08-08: qty 2, 28d supply, fill #2
  Filled 2023-09-01: qty 2, 28d supply, fill #3
  Filled 2023-10-02: qty 2, 28d supply, fill #4
  Filled 2023-10-27: qty 2, 28d supply, fill #5

## 2023-06-10 MED ORDER — HUMULIN R U-500 KWIKPEN 500 UNIT/ML ~~LOC~~ SOPN
PEN_INJECTOR | SUBCUTANEOUS | 3 refills | Status: DC
  Filled 2023-06-10: qty 24, 75d supply, fill #0
  Filled 2023-07-15: qty 24, 75d supply, fill #1

## 2023-06-10 NOTE — Progress Notes (Signed)
Outpatient Endocrinology Note Lindsey Itta Bena, MD  06/10/23   Lindsey Bowen December 10, 1970 [id number]  Referring Provider: Claiborne Rigg, NP Primary Care Provider: Claiborne Rigg, NP Reason for consultation: Subjective   Assessment & Plan  Diagnoses and all orders for this visit:  Uncontrolled type 2 diabetes mellitus with hyperglycemia (HCC)  Long-term (current) use of injectable non-insulin antidiabetic drugs  Long-term insulin use (HCC)  Pure hypercholesterolemia  Other orders -     insulin regular human CONCENTRATED (HUMULIN R U-500 KWIKPEN) 500 UNIT/ML KwikPen; Inject 80 units every morning before breakfast, 50 units before lunch, and 30 units before supper-15 min before meals -     Glucagon (BAQSIMI ONE PACK) 3 MG/DOSE POWD; Place 1 Device into the nose as needed (Low blood sugar with impaired consciousness). -     Dulaglutide (TRULICITY) 4.5 MG/0.5ML SOAJ; Inject 4.5 mg as directed once a week.   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 If not covered, take: Toujeo 80 units at bedtime  Humalog 30-35 units 2-3 times a day, 15 min before meals  Trulicity 4.5 mg weekly (nausea resoled)  Ordered and explained baqsimi/glucagon use and sent on 06/10/23 Jardiance gave UTI Couldn't tolerate metformin-tried different versions/doses for a year   Sees podiatry Dr Vivi Barrack   No known contraindications/side effects to any of above medications No history of MEN syndrome/medullary thyroid cancer/pancreatitis or pancreatic  cancer in self or family  -Last LD and Tg are as follows: Lab Results  Component Value Date   LDLCALC 52 01/25/2023    Lab Results  Component Value Date   TRIG 95 01/25/2023   -On rosuvastatin 20 mg QD -Follow low fat diet and exercise   -Blood pressure goal <140/90 - Microalbumin/creatinine goal is < 30 -Last MA/Cr is as follows: No results found for: "MICROALBUR", "MALB24HUR" -on ACE/ARB valsartanb 320 mg qd -diet changes including salt restriction -limit eating outside -counseled BP targets per standards of diabetes care -uncontrolled blood pressure can lead to retinopathy, nephropathy and cardiovascular and atherosclerotic heart disease  Reviewed and counseled on: -A1C target -Blood sugar targets -Complications of uncontrolled diabetes  -Checking blood sugar before meals and bedtime and bring log next visit -All medications with mechanism of action and side effects -Hypoglycemia management: rule of 15's, Glucagon Emergency Kit and medical alert ID -low-carb low-fat plate-method diet -At least 20 minutes of physical activity per day -Annual dilated retinal eye exam and foot exam -compliance and follow up needs -follow up as scheduled or earlier if problem gets worse  Call if blood sugar is less than 70 or consistently above 250    Take a 15 gm snack of carbohydrate at bedtime before you go to sleep if your blood sugar is less than 100.    If you are going to fast after midnight for a test or procedure, ask your physician for instructions on how to reduce/decrease your insulin dose.    Call if blood sugar is less than 70 or consistently above 250  -Treating a low  sugar by rule of 15  (15 gms of sugar every 15 min until sugar is more than 70) If you feel your sugar is low, test your sugar to be sure If your sugar is low (less than 70), then take 15 grams of a fast acting Carbohydrate (3-4 glucose tablets or glucose gel or 4 ounces of juice or regular soda) Recheck  your sugar 15 min after treating low to make sure it is more than 70 If sugar is still less than 70, treat again with 15 grams of carbohydrate          Don't drive the hour of hypoglycemia  If unconscious/unable to eat or drink by mouth, use glucagon injection or nasal spray baqsimi and call 911. Can repeat again in 15 min if still unconscious.  Return in about 1 week (around 06/17/2023) for tele-visit: 8:20 am.   I have reviewed current medications, nurse's notes, allergies, vital signs, past medical and surgical history, family medical history, and social history for this encounter. Counseled patient on symptoms, examination findings, lab findings, imaging results, treatment decisions and monitoring and prognosis. The patient understood the recommendations and agrees with the treatment plan. All questions regarding treatment plan were fully answered.  Lindsey [place], MD  06/10/23   History of Present Illness Lindsey Bowen is a 52 y.o. year old female who presents for follow up of Type II diabetes mellitus.  Lindsey Bowen was first diagnosed in 2012.   Diabetes education +  Home diabetes regimen: Toujeo 80 units at bedtime  Humalog 25 units 2-3 times a day, 15 min before meals  Trulicity 3 mg weekly-has some nausea   COMPLICATIONS +  MI/Stroke/TIA -  retinopathy -  neuropathy +  nephropathy  BLOOD SUGAR DATA  CGM interpretation: At today's visit, we reviewed her CGM downloads. The full report is scanned in the media. Reviewing the CGM trends, BG are elevated across the day.   Physical Exam  BP 120/74   Pulse 95   Ht 5\' 5"  (1.651 m)   Wt 235 lb 12.8 oz (107 kg)   LMP  (LMP Unknown)   SpO2 97%   BMI 39.24 kg/m    Constitutional: well developed, well nourished Head: normocephalic, atraumatic Eyes: sclera anicteric, no redness Neck: supple Lungs: normal respiratory effort Neurology: alert and oriented Skin: dry, no appreciable rashes Musculoskeletal: no appreciable  defects Psychiatric: normal mood and affect Diabetic Foot Exam - Simple   No data filed      Current Medications Patient's Medications  New Prescriptions   DULAGLUTIDE (TRULICITY) 4.5 MG/0.5ML SOAJ    Inject 4.5 mg as directed once a week.   GLUCAGON (BAQSIMI ONE PACK) 3 MG/DOSE POWD    Place 1 Device into the nose as needed (Low blood sugar with impaired consciousness).   INSULIN REGULAR HUMAN CONCENTRATED (HUMULIN R U-500 KWIKPEN) 500 UNIT/ML KWIKPEN    Inject 80 units every morning before breakfast, 50 units before lunch, and 30 units before supper-15 min before meals  Previous Medications   ACETAMINOPHEN (TYLENOL) 650 MG CR TABLET    Take 650 mg by mouth every 8 (eight) hours as needed for pain.   BLOOD PRESSURE MONITOR DEVI    Please provide patient with insurance approved blood pressure monitor ICD I 10.0   CHLORTHALIDONE (HYGROTON) 25 MG TABLET    Take 0.5 tablets (12.5 mg total) by mouth daily.   CONTINUOUS GLUCOSE SENSOR (DEXCOM G7 SENSOR) MISC    Apply 1 every 10 days  EPINEPHRINE 0.3 MG/0.3 ML IJ SOAJ INJECTION    INJECT ONE SYRINGE (0.3 MG) INTO THE MUSCLE AS NEEDED FOR ANAPHYLAXIS.   FLUCONAZOLE (DIFLUCAN) 150 MG TABLET    Take 1 tablet (150 mg total) by mouth every 3 (three) days.   FLUTICASONE (FLONASE) 50 MCG/ACT NASAL SPRAY    Place 2 sprays into both nostrils daily as needed for allergies or rhinitis.   IBUPROFEN (ADVIL) 200 MG TABLET    Take 400 mg by mouth every 6 (six) hours as needed.   INSULIN GLARGINE, 2 UNIT DIAL, (TOUJEO MAX SOLOSTAR) 300 UNIT/ML SOLOSTAR PEN    Inject 70 Units into the skin daily.   INSULIN LISPRO (HUMALOG KWIKPEN) 100 UNIT/ML KWIKPEN    Inject 20 Units into the skin 3 (three) times daily with meals.   INSULIN PEN NEEDLE (B-D UF III MINI PEN NEEDLES) 31G X 5 MM MISC    Use as instructed. Check blood glucose level by fingerstick 5-6 TIMES per day.   LEVOCETIRIZINE (XYZAL) 5 MG TABLET    Take 5 mg by mouth daily as needed.   LEVONORGESTREL  (LILETTA, 52 MG,) 20.1 MCG/DAY IUD    1 each by Intrauterine route once.   LEVOTHYROXINE (SYNTHROID) 175 MCG TABLET    Take 1 tablet (175 mcg total) by mouth daily before breakfast.   LINACLOTIDE (LINZESS) 72 MCG CAPSULE    Take 1 capsule (72 mcg total) by mouth daily before breakfast.   METHOCARBAMOL (ROBAXIN) 500 MG TABLET    Take 500 mg by mouth daily as needed for muscle spasms.   ROSUVASTATIN (CRESTOR) 20 MG TABLET    TAKE 1 TABLET (20 MG TOTAL) BY MOUTH AT BEDTIME.   SENNOSIDES-DOCUSATE SODIUM (SENOKOT-S) 8.6-50 MG TABLET    Take 2 tablets by mouth daily.   VALSARTAN (DIOVAN) 320 MG TABLET    Take 1 tablet (320 mg total) by mouth daily.   VENTOLIN HFA 108 (90 BASE) MCG/ACT INHALER    INHALE 1 TO 2 PUFFS INTO THE LUNGS EVERY 6 (SIX) HOURS AS NEEDED FOR WHEEZING OR SHORTNESS OF BREATH.   VITAMIN D, ERGOCALCIFEROL, (DRISDOL) 1.25 MG (50000 UNIT) CAPS CAPSULE    Take 1 capsule (50,000 Units total) by mouth every 7 (seven) days.  Modified Medications   No medications on file  Discontinued Medications   DULAGLUTIDE (TRULICITY) 3 MG/0.5ML SOAJ    Inject 3 mg as directed once a week.    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    Past Surgical History Past  Surgical History:  Procedure Laterality Date   CHOLECYSTECTOMY     DILATION  AND CURETTAGE OF UTERUS     HERNIA REPAIR      Family History family history includes Addison's disease in her mother; Asthma in her mother; Colon polyps in her mother; Diabetes in her maternal grandfather, mother, paternal grandmother, paternal uncle, and son; Glaucoma in her father and sister; Heart attack in her maternal aunt, maternal grandmother, and maternal uncle; Hypertension in her mother; Irritable bowel syndrome in her daughter and mother; Mental retardation in her sister; Stomach cancer in her maternal grandfather; Stroke in her maternal grandmother; Ulcerative colitis in her maternal grandfather.  Social History Social History   Socioeconomic History   Marital status: Single    Spouse name: Not on file   Number of children: 3   Years of education: Not on file   Highest education level: Some college, no degree  Occupational History   Occupation: Counselling psychologist: [employer]    Comment: Works nights  Tobacco Use   Smoking status: Never   Smokeless tobacco: Never  Vaping Use   Vaping status: Never Used  Substance and Sexual Activity   Alcohol use: No   Drug use: Not Currently   Sexual activity: Yes    Partners: Male    Birth control/protection: I.U.D.    Comment: Liletta  Other Topics Concern   Not on file  Social History Narrative   Lives    Caffeine use:    Social Drivers of Health   Financial Resource Strain: Medium Risk (05/02/2023)   Overall Financial Resource Strain (CARDIA)    Difficulty of Paying Living Expenses: Somewhat hard  Food Insecurity: Food Insecurity Present (05/02/2023)   Hunger Vital Sign    Worried About Running Out of Food in the Last Year: Often true    Ran Out of Food in the Last Year: Sometimes true  Transportation Needs: No Transportation Needs (05/02/2023)   PRAPARE - Administrator, Civil Service (Medical): No    Lack of  Transportation (Non-Medical): No  Physical Activity: Unknown (05/02/2023)   Exercise Vital Sign    Days of Exercise per Week: 0 days    Minutes of Exercise per Session: Not on file  Stress: Stress Concern Present (05/02/2023)   [employer] of Occupational Health - Occupational Stress Questionnaire    Feeling of Stress : To some extent  Social Connections: Moderately Isolated (05/02/2023)   Social Connection and Isolation Panel [NHANES]    Frequency of Communication with Friends and Family: More than three times a week    Frequency of Social Gatherings with Friends and Family: Never    Attends Religious Services: Never    Database administrator or Organizations: No    Attends Engineer, structural: More than 4 times per year    Marital Status: Never married  Intimate Partner Violence: Not At Risk (08/30/2022)   Received from [facility]   HITS    Over the last 12 months how often did your partner physically hurt you?: Never    Over the last 12 months how often did your partner insult you or talk down to you?: Never    Over the last 12 months how often did your partner threaten you with physical harm?: Never    Over the last 12 months how often did your partner scream or curse at you?: Never    Lab Results  Component Value Date   HGBA1C 12.3 (A) 05/02/2023   HGBA1C 11.2 (A) 01/25/2023   HGBA1C  9.5 (H) 10/12/2022   Lab Results  Component Value Date   CHOL 110 01/25/2023   Lab Results  Component Value Date   HDL 40 01/25/2023   Lab Results  Component Value Date   LDLCALC 52 01/25/2023   Lab Results  Component Value Date   TRIG 95 01/25/2023   Lab Results  Component Value Date   CHOLHDL 2.8 01/25/2023   Lab Results  Component Value Date   CREATININE 1.28 (H) 05/25/2023   No results found for: "GFR" No results found for: "MICROALBUR", "MALB24HUR"    Component Value Date/Time   NA 140 05/25/2023 1003   K 3.4 (L) 05/25/2023 1003   CL 98  05/25/2023 1003   CO2 29 05/25/2023 1003   GLUCOSE 280 (H) 05/25/2023 1003   GLUCOSE 224 (H) 05/26/2017 1355   BUN 20 05/25/2023 1003   CREATININE 1.28 (H) 05/25/2023 1003   CALCIUM 9.5 05/25/2023 1003   PROT 6.6 05/25/2023 1003   ALBUMIN 4.2 05/25/2023 1003   AST 15 05/25/2023 1003   ALT 19 05/25/2023 1003   ALKPHOS 91 05/25/2023 1003   BILITOT 0.4 05/25/2023 1003   GFRNONAA 61 03/28/2020 1702   GFRAA 71 03/28/2020 1702      Latest Ref Rng & Units 05/25/2023   10:03 AM 01/25/2023    9:56 AM 10/12/2022   11:27 AM  BMP  Glucose 70 - 99 mg/dL 161  096  045   BUN 6 - 24 mg/dL 20  11  10    Creatinine 0.57 - 1.00 mg/dL 4.09  8.11  9.14   BUN/Creat Ratio 9 - 23 16  12  10    Sodium 134 - 144 mmol/L 140  138  140   Potassium 3.5 - 5.2 mmol/L 3.4  3.8  3.3   Chloride 96 - 106 mmol/L 98  98  102   CO2 20 - 29 mmol/L 29  23  26    Calcium 8.7 - 10.2 mg/dL 9.5  9.4  9.4        Component Value Date/Time   WBC 7.0 10/12/2022 1127   WBC 7.4 01/29/2021 0953   RBC 5.26 10/12/2022 1127   RBC 5.08 01/29/2021 0953   HGB 12.2 10/12/2022 1127   HCT 39.3 10/12/2022 1127   PLT 274 10/12/2022 1127   MCV 75 (L) 10/12/2022 1127   MCH 23.2 (L) 10/12/2022 1127   MCH 22.8 (L) 05/27/2017 0426   MCHC 31.0 (L) 10/12/2022 1127   MCHC 31.8 01/29/2021 0953   RDW 14.2 10/12/2022 1127   LYMPHSABS 2.8 10/12/2022 1127   MONOABS 0.4 01/29/2021 0953   EOSABS 0.2 10/12/2022 1127   BASOSABS 0.0 10/12/2022 1127     Parts of this note may have been dictated using voice recognition software. There may be variances in spelling and vocabulary which are unintentional. Not all errors are proofread. Please notify the Thereasa Parkin if any discrepancies are noted or if the meaning of any statement is not clear.

## 2023-06-13 ENCOUNTER — Other Ambulatory Visit: Payer: Self-pay

## 2023-06-13 ENCOUNTER — Encounter: Payer: Self-pay | Admitting: "Endocrinology

## 2023-06-14 ENCOUNTER — Ambulatory Visit: Payer: Medicaid Other | Admitting: Podiatry

## 2023-06-14 ENCOUNTER — Encounter: Payer: Self-pay | Admitting: Podiatry

## 2023-06-14 ENCOUNTER — Ambulatory Visit: Payer: Medicaid Other | Attending: Family Medicine

## 2023-06-14 DIAGNOSIS — M9903 Segmental and somatic dysfunction of lumbar region: Secondary | ICD-10-CM | POA: Diagnosis not present

## 2023-06-14 DIAGNOSIS — Z23 Encounter for immunization: Secondary | ICD-10-CM | POA: Diagnosis not present

## 2023-06-14 DIAGNOSIS — L600 Ingrowing nail: Secondary | ICD-10-CM

## 2023-06-14 DIAGNOSIS — B351 Tinea unguium: Secondary | ICD-10-CM | POA: Diagnosis not present

## 2023-06-14 DIAGNOSIS — M5386 Other specified dorsopathies, lumbar region: Secondary | ICD-10-CM | POA: Diagnosis not present

## 2023-06-14 DIAGNOSIS — M9905 Segmental and somatic dysfunction of pelvic region: Secondary | ICD-10-CM | POA: Diagnosis not present

## 2023-06-14 DIAGNOSIS — M9904 Segmental and somatic dysfunction of sacral region: Secondary | ICD-10-CM | POA: Diagnosis not present

## 2023-06-14 MED ORDER — CICLOPIROX 8 % EX SOLN: Freq: Every day | CUTANEOUS | 2 refills | Status: DC

## 2023-06-14 NOTE — Progress Notes (Signed)
Subjective: Chief Complaint  Patient presents with   Ingrown Toenail    RM#11 Ingrown follow up and nail trim.    52 year old female presents the office today mostly for follow-up evaluation of ingrown toenail, thick elongated nails that she has difficulty trimming.  She said the ingrown toenails not really been causing an issue.  No drainage or pus.  No swelling.  No open lesions.  She has no other concerns today.  A1c is 12.3 on 05/02/2023  Objective: AAO x3, NAD DP/PT pulses palpable bilaterally, CRT less than 3 seconds Nails are hypertrophic, dystrophic and elongated there is incurvation present of the hallux toenails but there is no edema, erythema, drainage or pus or any signs of infection noted today.  Upon debridement unable to identify hyperpigmented lesion on the skin at the distal aspect of the right second toe.  Upon questioning her about this she states that this has been present for many years and it has not changed. No open lesions.  No pain with calf compression, swelling, warmth, erythema  Assessment: Symptomatic onychomycosis, ingrown toenail, hyperpigmention lesion   Plan: -All treatment options discussed with the patient including all alternatives, risks, complications.  -Sharply debrided nails x 10 without any complications or bleeding.  Upon debridement able to identify the area of the hyperpigmented lesion.  She states has been chronic for many years and is not changed.  Discussed possible biopsy but will continue to monitor for now but should this change or worsening would recommend proceeding with biopsy.   -We discussed partial nail avulsion however her symptoms are improving not having any significant pain.  Although she is diabetic this is put her at increased risk of complications and I do not want to use the sodium hydroxide because of her increased A1c.  Will hold open partial nail avulsion to the extremity with the corner with any complications or bleeding.   Prescribed cephalexin.  Should symptoms worsen or recur we will likely proceed with partial nail avulsion. -Compression socks, elevation for swelling -Patient encouraged to call the office with any questions, concerns, change in symptoms.   Vivi Barrack DPM

## 2023-06-14 NOTE — Patient Instructions (Signed)

## 2023-06-14 NOTE — Progress Notes (Signed)
Flu vaccine administered in LEFT deltoid per protocols.  Information sheet given. Patient denies and pain or discomfort at injection site. Tolerated injection well no reaction.    Shingles vaccine administered in right deltoid per protocols.  Information sheet given. Patient denies and pain or discomfort at injection site. Tolerated injection well no reaction.

## 2023-06-17 ENCOUNTER — Encounter: Payer: Self-pay | Admitting: "Endocrinology

## 2023-06-17 ENCOUNTER — Telehealth: Payer: Medicaid Other | Admitting: "Endocrinology

## 2023-06-17 DIAGNOSIS — Z794 Long term (current) use of insulin: Secondary | ICD-10-CM | POA: Diagnosis not present

## 2023-06-17 DIAGNOSIS — E1165 Type 2 diabetes mellitus with hyperglycemia: Secondary | ICD-10-CM

## 2023-06-17 DIAGNOSIS — Z7985 Long-term (current) use of injectable non-insulin antidiabetic drugs: Secondary | ICD-10-CM

## 2023-06-17 DIAGNOSIS — E7800 Pure hypercholesterolemia, unspecified: Secondary | ICD-10-CM

## 2023-06-17 NOTE — Progress Notes (Signed)
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, TIA, No results found for: "GFR" Hba1c goal less than 7, current Hba1c is  Lab Results  Component Value Date   HGBA1C 12.3 (A) 05/02/2023   Will recommend the following: Start U500 insulin 90 units qam before break fast, 55-60 units before lunch, 25 units before supper-15 min before meals  Trulicity 4.5 mg weekly   Ordered and explained baqsimi/glucagon use and sent on 06/10/23 Jardiance gave UTI Couldn't tolerate metformin-tried different versions/doses for a year   Sees podiatry Dr Lindsey Bowen   No known contraindications/side effects to any of above medications No history of MEN syndrome/medullary thyroid cancer/pancreatitis or pancreatic cancer in self or family  -Last LD and Tg are as follows: Lab Results  Component Value Date   LDLCALC 52 01/25/2023    Lab Results  Component Value Date   TRIG 95 01/25/2023   -On rosuvastatin 20 mg QD -Follow low fat diet and exercise   -Blood pressure goal <140/90 - Microalbumin/creatinine goal is < 30 -Last MA/Cr is as follows: No results found for: "MICROALBUR", "MALB24HUR" -on ACE/ARB valsartan 320 mg qd -diet changes including salt restriction -limit eating outside -counseled BP targets per standards of diabetes care -uncontrolled blood pressure can lead to retinopathy, nephropathy  and cardiovascular and atherosclerotic heart disease  Reviewed and counseled on: -A1C target -Blood sugar targets -Complications of uncontrolled diabetes  -Checking blood sugar before meals and bedtime and bring log next visit -All medications with mechanism of action and side effects -Hypoglycemia management: rule of 15's, Glucagon Emergency Kit and medical alert ID -low-carb low-fat plate-method diet -At least 20 minutes of physical activity per day -Annual dilated retinal eye exam and foot exam -compliance and follow up needs -follow up as scheduled or earlier if problem gets worse  Call if blood sugar is less than 70 or consistently above 250    Take a 15 gm snack of carbohydrate at bedtime before you go to sleep if your blood sugar is less than 100.    If you are going to fast after midnight for a test or procedure, ask your physician for instructions on how to reduce/decrease your insulin dose.    Call if blood sugar is less than 70 or consistently above 250  -Treating a low sugar by rule of 15  (15 gms of sugar every 15 min until sugar is more than 70) If you feel your sugar is low, test your sugar to be sure If your sugar is low (less than 70), then take 15 grams of a fast acting Carbohydrate (3-4 glucose tablets or glucose gel or 4 ounces of juice or regular soda) Recheck your sugar 15 min after treating low to make sure it is more than 70 If sugar is still less than 70, treat again with 15 grams of carbohydrate          Don't drive the hour of hypoglycemia  If unconscious/unable to  eat or drink by mouth, use glucagon injection or nasal spray baqsimi and call 911. Can repeat again in 15 min if still unconscious.  Return in about 2 weeks (around 07/01/2023).   I have reviewed current medications, nurse's notes, allergies, vital signs, past medical and surgical history, family medical history, and social history for this encounter. Counseled patient on symptoms, examination  findings, lab findings, imaging results, treatment decisions and monitoring and prognosis. The patient understood the recommendations and agrees with the treatment plan. All questions regarding treatment plan were fully answered.  Lindsey Bonanza, MD  06/17/23   History of Present Illness Lindsey Bowen is a 52 y.o. year old female who presents for follow up of Type II diabetes mellitus.  [name] was first diagnosed in 2012.   Diabetes education +  Home diabetes regimen: U500 insulin 80 units qam before break fast, 50 units before lunch, 30 units before supper-15 min before meals  Trulicity 4.5 mg weekly  Previously was on: Toujeo 80 units at bedtime  Humalog 25 units 2-3 times a day, 15 min before meals   COMPLICATIONS +  MI/Stroke/TIA -  retinopathy -  neuropathy +  nephropathy  BLOOD SUGAR DATA  CGM interpretation: At today's visit, we reviewed her CGM downloads. The full report is scanned in the media. Reviewing the CGM trends, BG are elevated across the day, except overnight.  Physical Exam  LMP  (LMP Unknown)    Constitutional: well developed, well nourished Head: normocephalic, atraumatic Eyes: sclera anicteric, no redness Neck: supple Lungs: normal respiratory effort Neurology: alert and oriented Skin: dry, no appreciable rashes Musculoskeletal: no appreciable defects Psychiatric: normal mood and affect Diabetic Foot Exam - Simple   No data filed      Current Medications Patient's Medications  New Prescriptions   No medications on file  Previous Medications   ACETAMINOPHEN (TYLENOL) 650 MG CR TABLET    Take 650 mg by mouth every 8 (eight) hours as needed for pain.   BLOOD PRESSURE MONITOR DEVI    Please provide patient with insurance approved blood pressure monitor ICD I 10.0   CHLORTHALIDONE (HYGROTON) 25 MG TABLET    Take 0.5 tablets (12.5 mg total) by mouth daily.   CICLOPIROX (PENLAC) 8 % SOLUTION    Apply topically at bedtime. Apply over nail  and surrounding skin. Apply daily over previous coat. After seven (7) days, may remove with alcohol and continue cycle.   CONTINUOUS GLUCOSE SENSOR (DEXCOM G7 SENSOR) MISC    Apply 1 every 10 days   DULAGLUTIDE (TRULICITY) 4.5 MG/0.5ML SOAJ    Inject 4.5 mg as directed once a week.   EPINEPHRINE 0.3 MG/0.3 ML IJ SOAJ INJECTION    INJECT ONE SYRINGE (0.3 MG) INTO THE MUSCLE AS NEEDED FOR ANAPHYLAXIS.   FLUCONAZOLE (DIFLUCAN) 150 MG TABLET    Take 1 tablet (150 mg total) by mouth every 3 (three) days.   FLUTICASONE (FLONASE) 50 MCG/ACT NASAL SPRAY    Place 2 sprays into both nostrils daily as needed for allergies or rhinitis.   GLUCAGON (BAQSIMI ONE PACK) 3 MG/DOSE POWD    Place 1 Device into the nose as needed (Low blood sugar with impaired consciousness).   IBUPROFEN (ADVIL) 200 MG TABLET    Take 400 mg by mouth every 6 (six) hours as needed.   INSULIN GLARGINE, 2 UNIT DIAL, (TOUJEO MAX SOLOSTAR) 300 UNIT/ML SOLOSTAR PEN    Inject 70 Units into the skin daily.   INSULIN LISPRO (HUMALOG KWIKPEN) 100  UNIT/ML KWIKPEN    Inject 20 Units into the skin 3 (three) times daily with meals.   INSULIN PEN NEEDLE (B-D UF III MINI PEN NEEDLES) 31G X 5 MM MISC    Use as instructed. Check blood glucose level by fingerstick 5-6 TIMES per day.   INSULIN REGULAR HUMAN CONCENTRATED (HUMULIN R U-500 KWIKPEN) 500 UNIT/ML KWIKPEN    Inject 80 units every morning before breakfast, 50 units before lunch, and 30 units before supper-15 min before meals   LEVOCETIRIZINE (XYZAL) 5 MG TABLET    Take 5 mg by mouth daily as needed.   LEVONORGESTREL (LILETTA, 52 MG,) 20.1 MCG/DAY IUD    1 each by Intrauterine route once.   LEVOTHYROXINE (SYNTHROID) 175 MCG TABLET    Take 1 tablet (175 mcg total) by mouth daily before breakfast.   LINACLOTIDE (LINZESS) 72 MCG CAPSULE    Take 1 capsule (72 mcg total) by mouth daily before breakfast.   METHOCARBAMOL (ROBAXIN) 500 MG TABLET    Take 500 mg by mouth daily as needed for muscle spasms.    ROSUVASTATIN (CRESTOR) 20 MG TABLET    TAKE 1 TABLET (20 MG TOTAL) BY MOUTH AT BEDTIME.   SENNOSIDES-DOCUSATE SODIUM (SENOKOT-S) 8.6-50 MG TABLET    Take 2 tablets by mouth daily.   VALSARTAN (DIOVAN) 320 MG TABLET    Take 1 tablet (320 mg total) by mouth daily.   VENTOLIN HFA 108 (90 BASE) MCG/ACT INHALER    INHALE 1 TO 2 PUFFS INTO THE LUNGS EVERY 6 (SIX) HOURS AS NEEDED FOR WHEEZING OR SHORTNESS OF BREATH.   VITAMIN D, ERGOCALCIFEROL, (DRISDOL) 1.25 MG (50000 UNIT) CAPS CAPSULE    Take 1 capsule (50,000 Units total) by mouth every 7 (seven) days.  Modified Medications   No medications on file  Discontinued Medications   No medications on file    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    Past Surgical History Past Surgical History:  Procedure Laterality Date   CHOLECYSTECTOMY     DILATION AND CURETTAGE OF UTERUS     HERNIA REPAIR      Family History family history includes Addison's disease in her mother; Asthma in her mother; Colon polyps in her mother; Diabetes in her maternal grandfather, mother, paternal grandmother, paternal uncle, and son; Glaucoma in her father and sister; Heart attack in her maternal aunt, maternal grandmother, and maternal  uncle; Hypertension in her mother; Irritable bowel syndrome in her daughter and mother; Mental retardation in her sister; Stomach cancer in her maternal grandfather; Stroke in her maternal grandmother; Ulcerative colitis in her maternal grandfather.  Social History Social History   Socioeconomic History   Marital status: Single    Spouse name: Not on file   Number of children: 3   Years of education: Not on file   Highest education level: Some college, no degree  Occupational History   Occupation: Counselling psychologist: [employer]    Comment: Works nights  Tobacco Use   Smoking status: Never   Smokeless tobacco: Never  Vaping Use   Vaping status: Never Used  Substance and Sexual Activity   Alcohol use: No   Drug use: Not Currently   Sexual activity: Yes    Partners: Male    Birth control/protection: I.U.D.    Comment: Liletta  Other Topics Concern   Not on file  Social History Narrative   Lives    Caffeine use:    Social Drivers of Health   Financial Resource Strain: Medium Risk (05/02/2023)   Overall Financial Resource Strain (CARDIA)    Difficulty of Paying Living Expenses: Somewhat hard  Food Insecurity: Food Insecurity Present (05/02/2023)   Hunger Vital Sign    Worried About Running Out of Food in the Last Year: Often true    Ran Out of Food in the Last Year: Sometimes true  Transportation Needs: No Transportation Needs (05/02/2023)   PRAPARE - Administrator, Civil Service (Medical): No    Lack of Transportation (Non-Medical): No  Physical Activity: Unknown (05/02/2023)   Exercise Vital Sign    Days of Exercise per Week: 0 days    Minutes of Exercise per Session: Not on file  Stress: Stress Concern Present (05/02/2023)   [employer] of Occupational Health - Occupational Stress Questionnaire    Feeling of Stress : To some extent  Social Connections: Moderately Isolated (05/02/2023)   Social Connection and Isolation Panel [NHANES]     Frequency of Communication with Friends and Family: More than three times a week    Frequency of Social Gatherings with Friends and Family: Never    Attends Religious Services: Never    Database administrator or Organizations: No    Attends Engineer, structural: More than 4 times per year    Marital Status: Never married  Intimate Partner Violence: Not At Risk (08/30/2022)   Received [facility]   HITS    Over the last 12 months how often did your partner physically hurt you?: Never    Over the last 12 months how often did your partner insult you or talk down to you?: Never    Over the last 12 months how often did your partner threaten you with physical harm?: Never    Over the last 12 months how often did your partner scream or curse at you?: Never    Lab Results  Component Value Date   HGBA1C 12.3 (A) 05/02/2023   HGBA1C 11.2 (A) 01/25/2023   HGBA1C 9.5 (H) 10/12/2022   Lab Results  Component Value Date   CHOL 110 01/25/2023   Lab Results  Component Value Date   HDL 40 01/25/2023   Lab Results  Component Value Date   LDLCALC 52 01/25/2023   Lab Results  Component Value Date   TRIG 95 01/25/2023   Lab Results  Component Value Date   CHOLHDL 2.8 01/25/2023   Lab Results  Component Value Date   CREATININE 1.28 (H) 05/25/2023   No results found for: "GFR" No results found for: "MICROALBUR", "MALB24HUR"  Component Value Date/Time   NA 140 05/25/2023 1003   K 3.4 (L) 05/25/2023 1003   CL 98 05/25/2023 1003   CO2 29 05/25/2023 1003   GLUCOSE 280 (H) 05/25/2023 1003   GLUCOSE 224 (H) 05/26/2017 1355   BUN 20 05/25/2023 1003   CREATININE 1.28 (H) 05/25/2023 1003   CALCIUM 9.5 05/25/2023 1003   PROT 6.6 05/25/2023 1003   ALBUMIN 4.2 05/25/2023 1003   AST 15 05/25/2023 1003   ALT 19 05/25/2023 1003   ALKPHOS 91 05/25/2023 1003   BILITOT 0.4 05/25/2023 1003   GFRNONAA 61 03/28/2020 1702   GFRAA 71 03/28/2020 1702      Latest Ref  Rng & Units 05/25/2023   10:03 AM 01/25/2023    9:56 AM 10/12/2022   11:27 AM  BMP  Glucose 70 - 99 mg/dL 811  914  782   BUN 6 - 24 mg/dL 20  11  10    Creatinine 0.57 - 1.00 mg/dL 9.56  2.13  0.86   BUN/Creat Ratio 9 - 23 16  12  10    Sodium 134 - 144 mmol/L 140  138  140   Potassium 3.5 - 5.2 mmol/L 3.4  3.8  3.3   Chloride 96 - 106 mmol/L 98  98  102   CO2 20 - 29 mmol/L 29  23  26    Calcium 8.7 - 10.2 mg/dL 9.5  9.4  9.4        Component Value Date/Time   WBC 7.0 10/12/2022 1127   WBC 7.4 01/29/2021 0953   RBC 5.26 10/12/2022 1127   RBC 5.08 01/29/2021 0953   HGB 12.2 10/12/2022 1127   HCT 39.3 10/12/2022 1127   PLT 274 10/12/2022 1127   MCV 75 (L) 10/12/2022 1127   MCH 23.2 (L) 10/12/2022 1127   MCH 22.8 (L) 05/27/2017 0426   MCHC 31.0 (L) 10/12/2022 1127   MCHC 31.8 01/29/2021 0953   RDW 14.2 10/12/2022 1127   LYMPHSABS 2.8 10/12/2022 1127   MONOABS 0.4 01/29/2021 0953   EOSABS 0.2 10/12/2022 1127   BASOSABS 0.0 10/12/2022 1127     Parts of this note may have been dictated using voice recognition software. There may be variances in spelling and vocabulary which are unintentional. Not all errors are proofread. Please notify the Thereasa Parkin if any discrepancies are noted or if the meaning of any statement is not clear.

## 2023-06-17 NOTE — Patient Instructions (Signed)
Start U500 insulin 90 units qam before break fast, 55-60 units before lunch, 25 units before supper-15 min before meals  Trulicity 4.5 mg weekly

## 2023-06-23 ENCOUNTER — Encounter: Payer: Self-pay | Admitting: "Endocrinology

## 2023-06-30 DIAGNOSIS — M9904 Segmental and somatic dysfunction of sacral region: Secondary | ICD-10-CM | POA: Diagnosis not present

## 2023-06-30 DIAGNOSIS — M9903 Segmental and somatic dysfunction of lumbar region: Secondary | ICD-10-CM | POA: Diagnosis not present

## 2023-06-30 DIAGNOSIS — M5386 Other specified dorsopathies, lumbar region: Secondary | ICD-10-CM | POA: Diagnosis not present

## 2023-06-30 DIAGNOSIS — M9905 Segmental and somatic dysfunction of pelvic region: Secondary | ICD-10-CM | POA: Diagnosis not present

## 2023-07-08 ENCOUNTER — Other Ambulatory Visit: Payer: Self-pay

## 2023-07-08 ENCOUNTER — Other Ambulatory Visit: Payer: Self-pay | Admitting: Nurse Practitioner

## 2023-07-08 DIAGNOSIS — T782XXS Anaphylactic shock, unspecified, sequela: Secondary | ICD-10-CM

## 2023-07-15 ENCOUNTER — Other Ambulatory Visit: Payer: Self-pay | Admitting: Nurse Practitioner

## 2023-07-15 ENCOUNTER — Encounter: Payer: Self-pay | Admitting: Nurse Practitioner

## 2023-07-15 ENCOUNTER — Other Ambulatory Visit: Payer: Self-pay

## 2023-07-15 DIAGNOSIS — F32A Depression, unspecified: Secondary | ICD-10-CM

## 2023-07-15 MED ORDER — EPINEPHRINE 0.3 MG/0.3ML IJ SOAJ
0.3000 mg | INTRAMUSCULAR | 0 refills | Status: DC | PRN
  Filled 2023-07-15: qty 2, 30d supply, fill #0
  Filled 2023-11-26: qty 2, 2d supply, fill #0

## 2023-07-19 ENCOUNTER — Ambulatory Visit: Payer: Medicaid Other | Admitting: "Endocrinology

## 2023-07-22 ENCOUNTER — Encounter: Payer: Self-pay | Admitting: "Endocrinology

## 2023-07-22 ENCOUNTER — Ambulatory Visit: Payer: Medicaid Other | Admitting: "Endocrinology

## 2023-07-22 ENCOUNTER — Other Ambulatory Visit: Payer: Self-pay

## 2023-07-22 VITALS — BP 124/82 | HR 81 | Ht 65.0 in | Wt 237.6 lb

## 2023-07-22 DIAGNOSIS — Z7985 Long-term (current) use of injectable non-insulin antidiabetic drugs: Secondary | ICD-10-CM

## 2023-07-22 DIAGNOSIS — E7800 Pure hypercholesterolemia, unspecified: Secondary | ICD-10-CM | POA: Diagnosis not present

## 2023-07-22 DIAGNOSIS — Z794 Long term (current) use of insulin: Secondary | ICD-10-CM

## 2023-07-22 DIAGNOSIS — E1165 Type 2 diabetes mellitus with hyperglycemia: Secondary | ICD-10-CM | POA: Diagnosis not present

## 2023-07-22 DIAGNOSIS — H524 Presbyopia: Secondary | ICD-10-CM | POA: Diagnosis not present

## 2023-07-22 LAB — POCT GLYCOSYLATED HEMOGLOBIN (HGB A1C): Hemoglobin A1C: 9.9 % — AB (ref 4.0–5.6)

## 2023-07-22 MED ORDER — HUMULIN R U-500 KWIKPEN 500 UNIT/ML ~~LOC~~ SOPN
PEN_INJECTOR | SUBCUTANEOUS | 3 refills | Status: AC
  Filled 2023-07-22: qty 45, fill #0
  Filled 2023-07-22: qty 30, 73d supply, fill #0
  Filled 2023-08-04: qty 30, 75d supply, fill #0
  Filled 2023-08-05: qty 45, 90d supply, fill #0
  Filled 2023-10-23: qty 45, 90d supply, fill #1
  Filled 2024-02-13: qty 45, 90d supply, fill #2
  Filled 2024-05-17 – 2024-05-28 (×2): qty 45, 90d supply, fill #3

## 2023-07-22 NOTE — Progress Notes (Signed)
Outpatient Endocrinology Note Lindsey Hazelton, MD  07/22/23   Lindsey Bowen [birth year] [id number]  Referring Provider: Claiborne Rigg, NP Primary Care Provider: Claiborne Rigg, NP Reason for consultation: Subjective   Assessment & Plan  Lindsey Bowen was seen today for follow-up.  Diagnoses and all orders for this visit:  Uncontrolled type 2 diabetes mellitus with hyperglycemia (HCC) -     POCT glycosylated hemoglobin (Hb A1C) -     Microalbumin / creatinine urine ratio -     Comprehensive metabolic panel  Long-term (current) use of injectable non-insulin antidiabetic drugs  Long-term insulin use (HCC)  Pure hypercholesterolemia  Other orders -     insulin regular human CONCENTRATED (HUMULIN R U-500 KWIKPEN) 500 UNIT/ML KwikPen; Inject 105 units every morning before breakfast, 70 units before lunch, and 30 units before supper-30 min before meals   Referred for hypothyroidism but pt would prefer to be seen for DM and continue hypothyroidism Rx with PCP 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, TIA, No results found for: "GFR" Hba1c goal less than 7, current Hba1c is  Lab Results  Component Value Date   HGBA1C 9.9 (A) 07/22/2023   Will recommend the following: U500 insulin 105 units qam before break fast, 70 units before lunch, and 30 units before dinner units before supper-15 min before meals (takes BF around 6pm, lunch and midnight and dinenr around 3 am) Trulicity 4.5 mg weekly   Ordered and explained baqsimi/glucagon use and sent on 06/10/23 Jardiance gave UTI Couldn't tolerate metformin-tried different versions/doses for a year   Sees podiatry Dr Lindsey Bowen   No known contraindications/side effects to any of above medications No history of MEN syndrome/medullary thyroid cancer/pancreatitis or pancreatic cancer in self or family  -Last LD and Tg are as follows: Lab Results  Component Value Date   LDLCALC 52 01/25/2023    Lab Results   Component Value Date   TRIG 95 01/25/2023   -On rosuvastatin 20 mg QD -Follow low fat diet and exercise   -Blood pressure goal <140/90 - Microalbumin/creatinine goal is < 30 -Last MA/Cr is as follows: No results found for: "MICROALBUR", "MALB24HUR" -on ACE/ARB valsartan 320 mg qd -diet changes including salt restriction -limit eating outside -counseled BP targets per standards of diabetes care -uncontrolled blood pressure can lead to retinopathy, nephropathy and cardiovascular and atherosclerotic heart disease  Reviewed and counseled on: -A1C target -Blood sugar targets -Complications of uncontrolled diabetes  -Checking blood sugar before meals and bedtime and bring log next visit -All medications with mechanism of action and side effects -Hypoglycemia management: rule of 15's, Glucagon Emergency Kit and medical alert ID -low-carb low-fat plate-method diet -At least 20 minutes of physical activity per day -Annual dilated retinal eye exam and foot exam -compliance and follow up needs -follow up as scheduled or earlier if problem gets worse  Call if blood sugar is less than 70 or consistently above 250    Take a 15 gm snack of carbohydrate at bedtime before you go to sleep if your blood sugar is less than 100.    If you are going to fast after midnight for a test or procedure, ask your physician for instructions on how to reduce/decrease your insulin dose.    Call if blood sugar is less than 70 or consistently above 250  -Treating a low sugar by rule of 15  (15 gms of sugar every 15 min until sugar is more than 70) If you feel your sugar is low,  test your sugar to be sure If your sugar is low (less than 70), then take 15 grams of a fast acting Carbohydrate (3-4 glucose tablets or glucose gel or 4 ounces of juice or regular soda) Recheck your sugar 15 min after treating low to make sure it is more than 70 If sugar is still less than 70, treat again with 15 grams of  carbohydrate          Don't drive the hour of hypoglycemia  If unconscious/unable to eat or drink by mouth, use glucagon injection or nasal spray baqsimi and call 911. Can repeat again in 15 min if still unconscious.  Return in about 13 days (around 08/04/2023) for visit, labs today.   I have reviewed current medications, nurse's notes, allergies, vital signs, past medical and surgical history, family medical history, and social history for this encounter. Counseled patient on symptoms, examination findings, lab findings, imaging results, treatment decisions and monitoring and prognosis. The patient understood the recommendations and agrees with the treatment plan. All questions regarding treatment plan were fully answered.  Lindsey [place], MD  07/22/23   History of Present Illness Lindsey Bowen is a 53 y.o. year old female who presents for follow up of Type II diabetes mellitus.  Lindsey Bowen was first diagnosed in 2012.   Diabetes education +  Home diabetes regimen: Start U500 insulin 90 units qam before break fast, 60 units before lunch, 30 units before supper-15 min before meals  Trulicity 4.5 mg weekly  Previously was on: Toujeo 80 units at bedtime  Humalog 25 units 2-3 times a day, 15 min before meals   COMPLICATIONS +  MI/Stroke/TIA -  retinopathy -  neuropathy +  nephropathy  BLOOD SUGAR DATA  CGM interpretation: At today's visit, we reviewed her CGM downloads. The full report is scanned in the media. Reviewing the CGM trends, BG are elevated across the day, except overnight.  Physical Exam  BP 124/82 (BP Location: Left Arm, Patient Position: Sitting, Cuff Size: Normal)   Pulse 81   Ht 5\' 5"  (1.651 m)   Wt 237 lb 9.6 oz (107.8 kg)   SpO2 96%   BMI 39.54 kg/m    Constitutional: well developed, well nourished Head: normocephalic, atraumatic Eyes: sclera anicteric, no redness Neck: supple Lungs: normal respiratory effort Neurology: alert and oriented Skin:  dry, no appreciable rashes Musculoskeletal: no appreciable defects Psychiatric: normal mood and affect Diabetic Foot Exam - Simple   No data filed      Current Medications Patient's Medications  New Prescriptions   No medications on file  Previous Medications   ACETAMINOPHEN (TYLENOL) 650 MG CR TABLET    Take 650 mg by mouth every 8 (eight) hours as needed for pain.   BLOOD PRESSURE MONITOR DEVI    Please provide patient with insurance approved blood pressure monitor ICD I 10.0   CHLORTHALIDONE (HYGROTON) 25 MG TABLET    Take 0.5 tablets (12.5 mg total) by mouth daily.   CICLOPIROX (PENLAC) 8 % SOLUTION    Apply topically at bedtime. Apply over nail and surrounding skin. Apply daily over previous coat. After seven (7) days, may remove with alcohol and continue cycle.   CONTINUOUS GLUCOSE SENSOR (DEXCOM G7 SENSOR) MISC    Apply 1 every 10 days   DULAGLUTIDE (TRULICITY) 4.5 MG/0.5ML SOAJ    Inject 4.5 mg as directed once a week.   EPINEPHRINE 0.3 MG/0.3 ML IJ SOAJ INJECTION    Inject 0.3 mg into the muscle as needed  for anaphylaxis.   FLUCONAZOLE (DIFLUCAN) 150 MG TABLET    Take 1 tablet (150 mg total) by mouth every 3 (three) days.   FLUTICASONE (FLONASE) 50 MCG/ACT NASAL SPRAY    Place 2 sprays into both nostrils daily as needed for allergies or rhinitis.   GLUCAGON (BAQSIMI ONE PACK) 3 MG/DOSE POWD    Place 1 Device into the nose as needed (Low blood sugar with impaired consciousness).   IBUPROFEN (ADVIL) 200 MG TABLET    Take 400 mg by mouth every 6 (six) hours as needed.   INSULIN PEN NEEDLE (B-D UF III MINI PEN NEEDLES) 31G X 5 MM MISC    Use as instructed. Check blood glucose level by fingerstick 5-6 TIMES per day.   LEVOCETIRIZINE (XYZAL) 5 MG TABLET    Take 5 mg by mouth daily as needed.   LEVONORGESTREL (LILETTA, 52 MG,) 20.1 MCG/DAY IUD    1 each by Intrauterine route once.   LEVOTHYROXINE (SYNTHROID) 175 MCG TABLET    Take 1 tablet (175 mcg total) by mouth daily before breakfast.    LINACLOTIDE (LINZESS) 72 MCG CAPSULE    Take 1 capsule (72 mcg total) by mouth daily before breakfast.   METHOCARBAMOL (ROBAXIN) 500 MG TABLET    Take 500 mg by mouth daily as needed for muscle spasms.   ROSUVASTATIN (CRESTOR) 20 MG TABLET    TAKE 1 TABLET (20 MG TOTAL) BY MOUTH AT BEDTIME.   SENNOSIDES-DOCUSATE SODIUM (SENOKOT-S) 8.6-50 MG TABLET    Take 2 tablets by mouth daily.   VALSARTAN (DIOVAN) 320 MG TABLET    Take 1 tablet (320 mg total) by mouth daily.   VENTOLIN HFA 108 (90 BASE) MCG/ACT INHALER    INHALE 1 TO 2 PUFFS INTO THE LUNGS EVERY 6 (SIX) HOURS AS NEEDED FOR WHEEZING OR SHORTNESS OF BREATH.   VITAMIN D, ERGOCALCIFEROL, (DRISDOL) 1.25 MG (50000 UNIT) CAPS CAPSULE    Take 1 capsule (50,000 Units total) by mouth every 7 (seven) days.  Modified Medications   Modified Medication Previous Medication   INSULIN REGULAR HUMAN CONCENTRATED (HUMULIN R U-500 KWIKPEN) 500 UNIT/ML KWIKPEN insulin regular human CONCENTRATED (HUMULIN R U-500 KWIKPEN) 500 UNIT/ML KwikPen      Inject 105 units every morning before breakfast, 70 units before lunch, and 30 units before supper-30 min before meals    Inject 80 units every morning before breakfast, 50 units before lunch, and 30 units before supper-15 min before meals  Discontinued Medications   INSULIN GLARGINE, 2 UNIT DIAL, (TOUJEO MAX SOLOSTAR) 300 UNIT/ML SOLOSTAR PEN    Inject 70 Units into the skin daily.   INSULIN LISPRO (HUMALOG KWIKPEN) 100 UNIT/ML KWIKPEN    Inject 20 Units into the skin 3 (three) times daily with meals.    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   Diabetes mellitus without  complication (HCC)    Gallstones    Hypothyroidism    Joint pain    Lumbar pain    Migraine    Onychodystrophy    Onychomycosis    Other fatigue    Paresthesia 05/17/2017  Shortness of breath on exertion    Sickle cell trait (HCC)    Sleep apnea    TIA (transient ischemic attack) 05/26/2017   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    Past Surgical History Past Surgical History:  Procedure Laterality Date   CHOLECYSTECTOMY     DILATION AND CURETTAGE OF UTERUS     HERNIA REPAIR      Family History family history includes Addison's disease in her mother; Asthma in her mother; Colon polyps in her mother; Diabetes in her maternal grandfather, mother, paternal grandmother, paternal uncle, and son; Glaucoma in her father and sister; Heart attack in her maternal aunt, maternal grandmother, and maternal uncle; Hypertension in her mother; Irritable bowel syndrome in her daughter and mother; Mental retardation in her sister; Stomach cancer in her maternal grandfather; Stroke in her maternal grandmother; Ulcerative colitis in her maternal grandfather.  Social History Social History   Socioeconomic History   Marital status: Single    Spouse name: Not on file   Number of children: 3   Years of education: Not on file   Highest education level: Some college, no degree  Occupational History   Occupation: Counselling psychologist: [employer]    Comment: Works nights  Tobacco Use   Smoking status: Never   Smokeless tobacco: Never  Vaping Use   Vaping status: Never Used  Substance and Sexual Activity   Alcohol use: No   Drug use: Not Currently   Sexual activity: Yes    Partners: Male    Birth control/protection: I.U.D.    Comment: Liletta  Other Topics Concern   Not on file  Social History Narrative   Lives    Caffeine use:    Social Drivers of Health   Financial Resource Strain: Medium Risk (05/02/2023)   Overall Financial Resource Strain  (CARDIA)    Difficulty of Paying Living Expenses: Somewhat hard  Food Insecurity: Food Insecurity Present (05/02/2023)   Hunger Vital Sign    Worried About Running Out of Food in the Last Year: Often true    Ran Out of Food in the Last Year: Sometimes true  Transportation Needs: No Transportation Needs (05/02/2023)   PRAPARE - Administrator, Civil Service (Medical): No    Lack of Transportation (Non-Medical): No  Physical Activity: Unknown (05/02/2023)   Exercise Vital Sign    Days of Exercise per Week: 0 days    Minutes of Exercise per Session: Not on file  Stress: Stress Concern Present (05/02/2023)   [employer] of Occupational Health - Occupational Stress Questionnaire    Feeling of Stress : To some extent  Social Connections: Moderately Isolated (05/02/2023)   Social Connection and Isolation Panel [NHANES]    Frequency of Communication with Friends and Family: More than three times a week    Frequency of Social Gatherings with Friends and Family: Never    Attends Religious Services: Never    Database administrator or Organizations: No    Attends Engineer, structural: More than 4 times per year    Marital Status: Never married  Intimate Partner Violence: Not At Risk (08/30/2022)   Received from [facility]   HITS    Over the last 12 months how often did your partner physically hurt you?: Never    Over the last 12 months how often did your partner insult you or talk down to you?: Never    Over the last  12 months how often did your partner threaten you with physical harm?: Never    Over the last 12 months how often did your partner scream or curse at you?: Never    Lab Results  Component Value Date   HGBA1C 9.9 (A) 07/22/2023   HGBA1C 12.3 (A) 05/02/2023   HGBA1C 11.2 (A) 01/25/2023   Lab Results  Component Value Date   CHOL 110 01/25/2023   Lab Results  Component Value Date   HDL 40 01/25/2023   Lab Results  Component Value Date    LDLCALC 52 01/25/2023   Lab Results  Component Value Date   TRIG 95 01/25/2023   Lab Results  Component Value Date   CHOLHDL 2.8 01/25/2023   Lab Results  Component Value Date   CREATININE 1.28 (H) 05/25/2023   No results found for: "GFR" No results found for: "MICROALBUR", "MALB24HUR"    Component Value Date/Time   NA 140 05/25/2023 1003   K 3.4 (L) 05/25/2023 1003   CL 98 05/25/2023 1003   CO2 29 05/25/2023 1003   GLUCOSE 280 (H) 05/25/2023 1003   GLUCOSE 224 (H) 05/26/2017 1355   BUN 20 05/25/2023 1003   CREATININE 1.28 (H) 05/25/2023 1003   CALCIUM 9.5 05/25/2023 1003   PROT 6.6 05/25/2023 1003   ALBUMIN 4.2 05/25/2023 1003   AST 15 05/25/2023 1003   ALT 19 05/25/2023 1003   ALKPHOS 91 05/25/2023 1003   BILITOT 0.4 05/25/2023 1003   GFRNONAA 61 03/28/2020 1702   GFRAA 71 03/28/2020 1702      Latest Ref Rng & Units 05/25/2023   10:03 AM 01/25/2023    9:56 AM 10/12/2022   11:27 AM  BMP  Glucose 70 - 99 mg/dL 409  811  914   BUN 6 - 24 mg/dL 20  11  10    Creatinine 0.57 - 1.00 mg/dL 7.82  9.56  2.13   BUN/Creat Ratio 9 - 23 16  12  10    Sodium 134 - 144 mmol/L 140  138  140   Potassium 3.5 - 5.2 mmol/L 3.4  3.8  3.3   Chloride 96 - 106 mmol/L 98  98  102   CO2 20 - 29 mmol/L 29  23  26    Calcium 8.7 - 10.2 mg/dL 9.5  9.4  9.4        Component Value Date/Time   WBC 7.0 10/12/2022 1127   WBC 7.4 01/29/2021 0953   RBC 5.26 10/12/2022 1127   RBC 5.08 01/29/2021 0953   HGB 12.2 10/12/2022 1127   HCT 39.3 10/12/2022 1127   PLT 274 10/12/2022 1127   MCV 75 (L) 10/12/2022 1127   MCH 23.2 (L) 10/12/2022 1127   MCH 22.8 (L) 05/27/2017 0426   MCHC 31.0 (L) 10/12/2022 1127   MCHC 31.8 01/29/2021 0953   RDW 14.2 10/12/2022 1127   LYMPHSABS 2.8 10/12/2022 1127   MONOABS 0.4 01/29/2021 0953   EOSABS 0.2 10/12/2022 1127   BASOSABS 0.0 10/12/2022 1127     Parts of this note may have been dictated using voice recognition software. There may be variances in  spelling and vocabulary which are unintentional. Not all errors are proofread. Please notify the Thereasa Parkin if any discrepancies are noted or if the meaning of any statement is not clear.

## 2023-07-22 NOTE — Patient Instructions (Addendum)
U500 insulin 105 units qam before break fast, 70 units before lunch, and 25 units before dinner units before supper-15 min before meals  Trulicity 4.5 mg weekly

## 2023-07-23 LAB — COMPREHENSIVE METABOLIC PANEL
AG Ratio: 1.8 (calc) (ref 1.0–2.5)
ALT: 16 U/L (ref 6–29)
AST: 12 U/L (ref 10–35)
Albumin: 4.1 g/dL (ref 3.6–5.1)
Alkaline phosphatase (APISO): 60 U/L (ref 37–153)
BUN/Creatinine Ratio: 13 (calc) (ref 6–22)
BUN: 14 mg/dL (ref 7–25)
CO2: 30 mmol/L (ref 20–32)
Calcium: 9.3 mg/dL (ref 8.6–10.4)
Chloride: 101 mmol/L (ref 98–110)
Creat: 1.08 mg/dL — ABNORMAL HIGH (ref 0.50–1.03)
Globulin: 2.3 g/dL (ref 1.9–3.7)
Glucose, Bld: 228 mg/dL — ABNORMAL HIGH (ref 65–99)
Potassium: 3.7 mmol/L (ref 3.5–5.3)
Sodium: 140 mmol/L (ref 135–146)
Total Bilirubin: 0.3 mg/dL (ref 0.2–1.2)
Total Protein: 6.4 g/dL (ref 6.1–8.1)

## 2023-07-23 LAB — MICROALBUMIN / CREATININE URINE RATIO
Creatinine, Urine: 212 mg/dL (ref 20–275)
Microalb Creat Ratio: 52 mg/g{creat} — ABNORMAL HIGH (ref <30)
Microalb, Ur: 11 mg/dL

## 2023-07-28 ENCOUNTER — Other Ambulatory Visit: Payer: Self-pay

## 2023-08-02 ENCOUNTER — Other Ambulatory Visit: Payer: Self-pay

## 2023-08-04 ENCOUNTER — Ambulatory Visit: Payer: Medicaid Other | Admitting: "Endocrinology

## 2023-08-04 ENCOUNTER — Other Ambulatory Visit: Payer: Self-pay

## 2023-08-05 ENCOUNTER — Other Ambulatory Visit: Payer: Self-pay

## 2023-08-08 ENCOUNTER — Other Ambulatory Visit: Payer: Self-pay

## 2023-08-09 ENCOUNTER — Other Ambulatory Visit: Payer: Self-pay

## 2023-08-09 ENCOUNTER — Encounter: Payer: Self-pay | Admitting: Pharmacist

## 2023-08-29 ENCOUNTER — Ambulatory Visit: Payer: Medicaid Other | Attending: Nurse Practitioner | Admitting: Nurse Practitioner

## 2023-08-29 ENCOUNTER — Encounter: Payer: Self-pay | Admitting: Nurse Practitioner

## 2023-08-29 VITALS — BP 123/80 | HR 83 | Ht 65.0 in | Wt 237.4 lb

## 2023-08-29 DIAGNOSIS — E559 Vitamin D deficiency, unspecified: Secondary | ICD-10-CM

## 2023-08-29 DIAGNOSIS — E785 Hyperlipidemia, unspecified: Secondary | ICD-10-CM

## 2023-08-29 DIAGNOSIS — E039 Hypothyroidism, unspecified: Secondary | ICD-10-CM

## 2023-08-29 DIAGNOSIS — I10 Essential (primary) hypertension: Secondary | ICD-10-CM

## 2023-08-29 DIAGNOSIS — Z139 Encounter for screening, unspecified: Secondary | ICD-10-CM | POA: Diagnosis not present

## 2023-08-29 DIAGNOSIS — K5909 Other constipation: Secondary | ICD-10-CM | POA: Diagnosis not present

## 2023-08-29 MED ORDER — ROSUVASTATIN CALCIUM 20 MG PO TABS: 20.0000 mg | ORAL_TABLET | Freq: Every day | ORAL | 0 refills | Status: DC

## 2023-08-29 MED ORDER — LINACLOTIDE 72 MCG PO CAPS: 72.0000 ug | ORAL_CAPSULE | Freq: Every day | ORAL | 1 refills | Status: AC

## 2023-08-29 MED ORDER — ROSUVASTATIN CALCIUM 20 MG PO TABS
20.0000 mg | ORAL_TABLET | Freq: Every day | ORAL | 1 refills | Status: DC
Start: 2023-08-29 — End: 2024-02-13

## 2023-08-29 MED ORDER — LEVOTHYROXINE SODIUM 175 MCG PO TABS: 175.0000 ug | ORAL_TABLET | Freq: Every day | ORAL | 1 refills | Status: DC

## 2023-08-29 MED ORDER — SENNA-DOCUSATE SODIUM 8.6-50 MG PO TABS: 2.0000 | ORAL_TABLET | Freq: Every day | ORAL | 1 refills | Status: AC

## 2023-08-29 MED ORDER — SENNA-DOCUSATE SODIUM 8.6-50 MG PO TABS: 2.0000 | ORAL_TABLET | Freq: Every day | ORAL | 6 refills | Status: DC

## 2023-08-29 MED ORDER — VITAMIN D (ERGOCALCIFEROL) 1.25 MG (50000 UNIT) PO CAPS: 50000.0000 [IU] | ORAL_CAPSULE | ORAL | 0 refills | Status: DC

## 2023-08-29 MED ORDER — CHLORTHALIDONE 25 MG PO TABS: 12.5000 mg | ORAL_TABLET | Freq: Every day | ORAL | 1 refills | Status: DC

## 2023-08-29 NOTE — Addendum Note (Signed)
 Addended by: Dalene Carrow I on: 08/29/2023 10:09 AM   Modules accepted: Orders

## 2023-08-29 NOTE — Progress Notes (Signed)
 Assessment & Plan:  Lindsey Bowen was seen today for medical management of chronic issues.  Diagnoses and all orders for this visit:  Primary hypertension -     chlorthalidone (HYGROTON) 25 MG tablet; Take 0.5 tablets (12.5 mg total) by mouth daily. Continue all antihypertensives as prescribed.  Reminded to bring in blood pressure log for follow  up appointment.  RECOMMENDATIONS: DASH/Mediterranean Diets are healthier choices for HTN.    Acquired hypothyroidism -     Thyroid Panel With TSH -     levothyroxine (SYNTHROID) 175 MCG tablet; Take 1 tablet (175 mcg total) by mouth daily before breakfast.  Chronic constipation -     linaclotide (LINZESS) 72 MCG capsule; Take 1 capsule (72 mcg total) by mouth daily before breakfast. -     sennosides-docusate sodium (SENOKOT-S) 8.6-50 MG tablet; Take 2 tablets by mouth daily.  Hyperlipidemia LDL goal <70 -     rosuvastatin (CRESTOR) 20 MG tablet; Take 1 tablet (20 mg total) by mouth at bedtime.  Vitamin D deficiency disease -     VITAMIN D 25 Hydroxy (Vit-D Deficiency, Fractures) -     Vitamin D, Ergocalciferol, (DRISDOL) 1.25 MG (50000 UNIT) CAPS capsule; Take 1 capsule (50,000 Units total) by mouth once a week.     Patient has been counseled on age-appropriate routine health concerns for screening and prevention. These are reviewed and up-to-date. Referrals have been placed accordingly. Immunizations are up-to-date or declined.    Subjective:   Chief Complaint  Patient presents with   Medical Management of Chronic Issues    Lindsey Bowen 53 y.o. 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    HTN Blood pressure is well controlled with chlorthalidone 25 mg daily and valsartan 320 mg daily both of which she takes as prescribed.  BP Readings from Last 3 Encounters:  08/29/23 123/80  07/22/23 124/82  06/10/23 120/74     Hypothyroidism Thyroid levels at goal. She is taking levothyroxine daily as prescribed. Endorses fatigue and has chronic constipation.  Lab Results  Component Value Date   TSH 0.624 05/25/2023   T4TOTAL 9.8 05/25/2023      Review of Systems  Constitutional:  Positive for malaise/fatigue. Negative for fever and weight loss.  HENT: Negative.  Negative for nosebleeds.   Eyes: Negative.  Negative for blurred vision, double vision and photophobia.  Respiratory: Negative.  Negative for cough and shortness of breath.   Cardiovascular: Negative.  Negative for chest pain, palpitations and leg swelling.  Gastrointestinal:  Positive for constipation. Negative for abdominal pain, blood in stool, diarrhea, heartburn, melena, nausea and vomiting.  Musculoskeletal: Negative.  Negative for myalgias.  Neurological: Negative.  Negative for dizziness, focal weakness, seizures and headaches.  Psychiatric/Behavioral: Negative.  Negative for suicidal ideas.     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  Diabetes mellitus without complication (HCC)    Gallstones    Hypothyroidism    Joint pain    Lumbar pain    Migraine    Onychodystrophy    Onychomycosis    Other fatigue    Paresthesia 05/17/2017   Shortness of breath on exertion    Sickle cell trait (HCC)    Sleep apnea    TIA (transient  ischemic attack) 05/26/2017   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    Past Surgical History:  Procedure Laterality Date   CHOLECYSTECTOMY     DILATION AND CURETTAGE OF UTERUS     HERNIA REPAIR      Family History  Problem Relation Age of Onset   Diabetes Mother    Hypertension Mother    Addison's disease Mother    Asthma Mother    Colon polyps Mother    Irritable bowel syndrome Mother    Glaucoma Father    Mental retardation Sister    Glaucoma Sister    Heart attack Maternal Aunt    Heart attack Maternal Uncle    Diabetes Paternal Uncle    Heart attack Maternal Grandmother    Stroke Maternal Grandmother    Ulcerative colitis Maternal Grandfather    Diabetes Maternal Grandfather    Stomach cancer Maternal Grandfather    Diabetes Paternal Grandmother    Irritable bowel syndrome Daughter    Diabetes Son    Colon cancer Neg Hx    Esophageal cancer Neg Hx    Rectal cancer Neg Hx     Social History Reviewed with no changes to be made today.   Outpatient Medications Prior to Visit  Medication Sig Dispense Refill   acetaminophen (TYLENOL) 650 MG CR tablet Take 650 mg by mouth every 8 (eight) hours as needed for pain.     Blood Pressure Monitor DEVI Please provide patient with insurance approved blood pressure monitor ICD I 10.0 1 each 0   ciclopirox (PENLAC) 8 % solution Apply topically at bedtime. Apply over nail and surrounding skin. Apply daily over previous coat. After seven (7) days, may remove with alcohol and continue cycle. 6.6 mL 2   Continuous Glucose Sensor (DEXCOM G7 SENSOR) MISC Apply 1 every 10 days 9 each 1   Dulaglutide (TRULICITY) 4.5 MG/0.5ML SOAJ Inject 4.5 mg as directed once a week. 6 mL 1   EPINEPHrine 0.3 mg/0.3 mL IJ SOAJ injection Inject 0.3 mg into the muscle as needed for anaphylaxis. 2 each 0   fluticasone (FLONASE) 50 MCG/ACT nasal spray Place 2 sprays into both nostrils daily as needed for allergies or rhinitis.      Glucagon (BAQSIMI ONE PACK) 3 MG/DOSE POWD Place 1 Device into the nose as needed (Low blood sugar with impaired consciousness). 2 each 3   Insulin Pen Needle (B-D UF III MINI PEN NEEDLES) 31G X 5 MM MISC Use as instructed. Check blood glucose level by fingerstick 5-6 TIMES per day. 200 each 6   insulin regular human CONCENTRATED (HUMULIN R U-500 KWIKPEN) 500 UNIT/ML KwikPen Inject 105 units every morning before breakfast, 70 units before lunch, and 30 units before supper-30 min before meals 45 mL 3   levocetirizine (XYZAL) 5 MG tablet Take 5 mg by mouth daily as needed.     levonorgestrel (LILETTA, 52 MG,) 20.1 MCG/DAY IUD 1 each by Intrauterine route once.     valsartan (DIOVAN) 320 MG tablet Take 1 tablet (320 mg total) by mouth daily. 90 tablet 3  VENTOLIN HFA 108 (90 Base) MCG/ACT inhaler INHALE 1 TO 2 PUFFS INTO THE LUNGS EVERY 6 (SIX) HOURS AS NEEDED FOR WHEEZING OR SHORTNESS OF BREATH. 54 g 1   chlorthalidone (HYGROTON) 25 MG tablet Take 0.5 tablets (12.5 mg total) by mouth daily. 45 tablet 1   fluconazole (DIFLUCAN) 150 MG tablet Take 1 tablet (150 mg total) by mouth every 3 (three) days. 3 tablet 0   ibuprofen (ADVIL) 200 MG tablet Take 400 mg by mouth every 6 (six) hours as needed.     levothyroxine (SYNTHROID) 175 MCG tablet Take 1 tablet (175 mcg total) by mouth daily before breakfast. 90 tablet 1   linaclotide (LINZESS) 72 MCG capsule Take 1 capsule (72 mcg total) by mouth daily before breakfast. 30 capsule 3   methocarbamol (ROBAXIN) 500 MG tablet Take 500 mg by mouth daily as needed for muscle spasms.     rosuvastatin (CRESTOR) 20 MG tablet TAKE 1 TABLET (20 MG TOTAL) BY MOUTH AT BEDTIME. 90 tablet 0   sennosides-docusate sodium (SENOKOT-S) 8.6-50 MG tablet Take 2 tablets by mouth daily. 60 tablet 6   Vitamin D, Ergocalciferol, (DRISDOL) 1.25 MG (50000 UNIT) CAPS capsule Take 1 capsule (50,000 Units total) by mouth every 7 (seven) days. 4 capsule 0   No facility-administered  medications prior to visit.    Allergies  Allergen Reactions   Dust Mite Extract Hives and Itching   Shellfish Allergy Anaphylaxis       Objective:    BP 123/80 (BP Location: Right Arm, Patient Position: Sitting, Cuff Size: Large)   Pulse 83   Ht 5\' 5"  (1.651 m)   Wt 237 lb 6.4 oz (107.7 kg)   SpO2 97%   BMI 39.51 kg/m  Wt Readings from Last 3 Encounters:  08/29/23 237 lb 6.4 oz (107.7 kg)  07/22/23 237 lb 9.6 oz (107.8 kg)  06/10/23 235 lb 12.8 oz (107 kg)    Physical Exam Vitals and nursing note reviewed.  Constitutional:      Appearance: She is well-developed.  HENT:     Head: Normocephalic and atraumatic.  Cardiovascular:     Rate and Rhythm: Normal rate and regular rhythm.     Heart sounds: Normal heart sounds. No murmur heard.    No friction rub. No gallop.  Pulmonary:     Effort: Pulmonary effort is normal. No tachypnea or respiratory distress.     Breath sounds: Normal breath sounds. No decreased breath sounds, wheezing, rhonchi or rales.  Chest:     Chest wall: No tenderness.  Abdominal:     General: Bowel sounds are normal.     Palpations: Abdomen is soft.  Musculoskeletal:        General: Normal range of motion.     Cervical back: Normal range of motion.  Skin:    General: Skin is warm and dry.  Neurological:     Mental Status: She is alert and oriented to person, place, and time.     Coordination: Coordination normal.  Psychiatric:        Behavior: Behavior normal. Behavior is cooperative.        Thought Content: Thought content normal.        Judgment: Judgment normal.          Patient has been counseled extensively about nutrition and exercise as well as the importance of adherence with medications and regular follow-up. The patient was given clear instructions to go to ER or return to medical center if symptoms don't  improve, worsen or new problems develop. The patient verbalized understanding.   Follow-up: Return in about 3 months (around  11/29/2023).   Claiborne Rigg, FNP-BC [facility] [place], [place] [phone]   08/29/2023, 9:28 AM

## 2023-08-30 ENCOUNTER — Telehealth: Payer: Self-pay

## 2023-08-30 LAB — THYROID PANEL WITH TSH
Free Thyroxine Index: 2.4 (ref 1.2–4.9)
T3 Uptake Ratio: 28 % (ref 24–39)
T4, Total: 8.5 ug/dL (ref 4.5–12.0)
TSH: 0.203 u[IU]/mL — ABNORMAL LOW (ref 0.450–4.500)

## 2023-08-30 LAB — VITAMIN D 25 HYDROXY (VIT D DEFICIENCY, FRACTURES): Vit D, 25-Hydroxy: 15.9 ng/mL — ABNORMAL LOW (ref 30.0–100.0)

## 2023-08-30 NOTE — Progress Notes (Signed)
 Complex Care Management Note Care Guide Note  08/30/2023 Name: Lindsey Bowen MRN: [id number] DOB: 1971/01/22   Complex Care Management Outreach Attempts: An unsuccessful telephone outreach was attempted today to offer the patient information about available complex care management services.  Follow Up Plan:  Additional outreach attempts will be made to offer the patient complex care management information and services.   Encounter Outcome:  No Answer  [name] [facility]  [facility] Guide Direct Dial: [phone]  Fax: [phone] Website: [email or web address]

## 2023-08-31 ENCOUNTER — Encounter: Payer: Self-pay | Admitting: Nurse Practitioner

## 2023-09-01 ENCOUNTER — Encounter: Payer: Self-pay | Admitting: Pharmacist

## 2023-09-01 ENCOUNTER — Other Ambulatory Visit: Payer: Self-pay

## 2023-09-01 ENCOUNTER — Other Ambulatory Visit (HOSPITAL_COMMUNITY): Payer: Self-pay

## 2023-09-05 ENCOUNTER — Telehealth: Payer: Self-pay

## 2023-09-05 NOTE — Progress Notes (Signed)
 Complex Care Management Note Care Guide Note  09/05/2023 Name: Lindsey Bowen MRN: [id number] DOB: [birth year]   Complex Care Management Outreach Attempts: A second unsuccessful outreach was attempted today to offer the patient with information about available complex care management services.  Follow Up Plan:  Additional outreach attempts will be made to offer the patient complex care management information and services.   Encounter Outcome:  No Answer  Aren Pryde [facility]  [facility] Guide Direct Dial: [phone]  Fax: [phone] Website: [email or web address]

## 2023-09-06 ENCOUNTER — Telehealth: Payer: Self-pay

## 2023-09-06 NOTE — Progress Notes (Signed)
 Complex Care Management Note Care Guide Note  09/06/2023 Name: [name] MRN: [id number] DOB: 1970/12/08  Lindsey Bowen is a 53 y.o. year old female who is a primary care patient of Claiborne Rigg, NP . The community resource team was consulted for assistance with Food Insecurity and Financial Difficulties related to utilities.  SDOH screenings and interventions completed:  Yes  Social Drivers of Health From This Encounter   Food Insecurity: Food Insecurity Present (09/06/2023)   Hunger Vital Sign    Worried About Running Out of Food in the Last Year: Sometimes true    Ran Out of Food in the Last Year: Sometimes true  Housing: Low Risk  (09/06/2023)   Housing Stability Vital Sign    Unable to Pay for Housing in the Last Year: No    Number of Times Moved in the Last Year: 0    Homeless in the Last Year: No  Financial Resource Strain: Medium Risk (09/06/2023)   Overall Financial Resource Strain (CARDIA)    Difficulty of Paying Living Expenses: Somewhat hard  Transportation Needs: No Transportation Needs (09/06/2023)   PRAPARE - Administrator, Civil Service (Medical): No    Lack of Transportation (Non-Medical): No  Utilities: At Risk (09/06/2023)   Utilities    Threatened with loss of utilities: Yes    SDOH Interventions Today    Flowsheet Row Most Recent Value  SDOH Interventions   Food Insecurity Interventions Other (Comment)  [Emailed food pantry list]  Utilities Interventions Other (Comment)  [Emailed information and application for [employer] and community resources to assist with utilities.]        Care guide performed the following interventions: Spoke with patient verified email address to send utility assistance and food resources per patient request.  Follow Up Plan:  No further follow up planned at this time. The patient has been provided with needed resources.  Encounter Outcome:  Patient Visit Completed  SIGMilicent [facility]  [facility] Guide Direct Dial: [phone]  Fax: [phone] Website: [email or web address]

## 2023-09-12 ENCOUNTER — Ambulatory Visit: Payer: Medicaid Other | Admitting: Podiatry

## 2023-09-12 ENCOUNTER — Encounter: Payer: Self-pay | Admitting: Podiatry

## 2023-09-12 DIAGNOSIS — M79675 Pain in left toe(s): Secondary | ICD-10-CM

## 2023-09-12 DIAGNOSIS — B351 Tinea unguium: Secondary | ICD-10-CM

## 2023-09-12 DIAGNOSIS — E1151 Type 2 diabetes mellitus with diabetic peripheral angiopathy without gangrene: Secondary | ICD-10-CM | POA: Diagnosis not present

## 2023-09-12 DIAGNOSIS — D492 Neoplasm of unspecified behavior of bone, soft tissue, and skin: Secondary | ICD-10-CM | POA: Diagnosis not present

## 2023-09-12 DIAGNOSIS — M79674 Pain in right toe(s): Secondary | ICD-10-CM

## 2023-09-13 NOTE — Progress Notes (Signed)
 Subjective: Chief Complaint  Patient presents with   [facility]    RM#[id number] [facility] patient states here for her routine foot care.     53 year old female presents the office today mostly for follow-up evaluation of ingrown toenail, thick elongated nails that she has difficulty trimming.  She denies any swelling redness or any drainage.  Otherwise she states that she has been doing well.  She is recently changed medications for her diabetes and her sugars getting better she reports.  A1c is 9.9 on July 22, 2023  Objective: AAO x3, NAD DP/PT pulses palpable bilaterally, CRT less than 3 seconds Nails are hypertrophic, dystrophic and elongated there is incurvation present of the hallux toenails but there is no edema, erythema, drainage or pus or any signs of infection noted today.  There is still hyperpigmented lesion noted distal aspect of right second toe.  Uniform color but she states it has gotten somewhat larger.  No pain with calf compression, swelling, warmth, erythema  Assessment: Symptomatic onychomycosis, ingrown toenail, hyperpigmention lesion   Plan: -All treatment options discussed with the patient including all alternatives, risks, complications.  -Sharply debrided nails x 10 without any complications or bleeding.   -Upon debridement able to identify the area of the hyperpigmented lesion.  I discussed and recommended biopsy of this lesion.  Will plan on doing this next week when she is out of work.  We discussed the procedure as well as postoperative course.  No follow-ups on file.  Vivi Barrack DPM

## 2023-09-20 ENCOUNTER — Other Ambulatory Visit: Payer: Self-pay

## 2023-09-20 ENCOUNTER — Encounter: Payer: Self-pay | Admitting: "Endocrinology

## 2023-09-20 ENCOUNTER — Ambulatory Visit (INDEPENDENT_AMBULATORY_CARE_PROVIDER_SITE_OTHER): Admitting: Podiatry

## 2023-09-20 ENCOUNTER — Ambulatory Visit (INDEPENDENT_AMBULATORY_CARE_PROVIDER_SITE_OTHER): Payer: Medicaid Other | Admitting: "Endocrinology

## 2023-09-20 ENCOUNTER — Encounter: Payer: Self-pay | Admitting: Podiatry

## 2023-09-20 VITALS — BP 126/80 | HR 87 | Ht 65.0 in | Wt 242.0 lb

## 2023-09-20 DIAGNOSIS — E1165 Type 2 diabetes mellitus with hyperglycemia: Secondary | ICD-10-CM | POA: Diagnosis not present

## 2023-09-20 DIAGNOSIS — E7800 Pure hypercholesterolemia, unspecified: Secondary | ICD-10-CM | POA: Diagnosis not present

## 2023-09-20 DIAGNOSIS — Z7985 Long-term (current) use of injectable non-insulin antidiabetic drugs: Secondary | ICD-10-CM | POA: Diagnosis not present

## 2023-09-20 DIAGNOSIS — Z794 Long term (current) use of insulin: Secondary | ICD-10-CM | POA: Diagnosis not present

## 2023-09-20 DIAGNOSIS — E039 Hypothyroidism, unspecified: Secondary | ICD-10-CM | POA: Diagnosis not present

## 2023-09-20 DIAGNOSIS — L819 Disorder of pigmentation, unspecified: Secondary | ICD-10-CM

## 2023-09-20 DIAGNOSIS — D492 Neoplasm of unspecified behavior of bone, soft tissue, and skin: Secondary | ICD-10-CM | POA: Diagnosis not present

## 2023-09-20 MED ORDER — DOXYCYCLINE HYCLATE 100 MG PO TABS: 100.0000 mg | ORAL_TABLET | Freq: Two times a day (BID) | ORAL | 0 refills | Status: DC

## 2023-09-20 MED ORDER — LEVOTHYROXINE SODIUM 150 MCG PO TABS
150.0000 ug | ORAL_TABLET | Freq: Every day | ORAL | 1 refills | Status: DC
  Filled 2023-09-20 – 2023-10-03 (×2): qty 90, 90d supply, fill #0
  Filled 2024-01-16: qty 90, 90d supply, fill #1

## 2023-09-20 NOTE — Patient Instructions (Addendum)
 Will recommend the following: U500 insulin 115 units qam before break fast, 80 units before lunch, and 25 units before dinner units before supper-15 min before meals (takes BF around 6pm, lunch and midnight and dinenr around 3 am) Trulicity 4.5 mg weekly   ____________    Goals of DM therapy:  Morning Fasting blood sugar: 80-140  Blood sugar before meals: 80-140 Bed time blood sugar: 100-150  A1C <7%, limited only by hypoglycemia  1.Diabetes medications and their side effects discussed, including hypoglycemia    2. Check blood glucose:  a) Always check blood sugars before driving. Please see below (under hypoglycemia) on how to manage b) Check a minimum of 3 times/day or more as needed when having symptoms of hypoglycemia.   c) Try to check blood glucose before sleeping/in the middle of the night to ensure that it is remaining stable and not dropping less than 100 d) Check blood glucose more often if sick  3. Diet: a) 3 meals per day schedule b: Restrict carbs to 60-70 grams (4 servings) per meal c) Colorful vegetables - 3 servings a day, and low sugar fruit 2 servings/day Plate control method: 1/4 plate protein, 1/4 starch, 1/2 green, yellow, or red vegetables d) Avoid carbohydrate snacks unless hypoglycemic episode, or increased physical activity  4. Regular exercise as tolerated, preferably 3 or more hours a week  5. Hypoglycemia: a)  Do not drive or operate machinery without first testing blood glucose to assure it is over 90 mg%, or if dizzy, lightheaded, not feeling normal, etc, or  if foot or leg is numb or weak. b)  If blood glucose less than 70, take four 5gm Glucose tabs or 15-30 gm Glucose gel.  Repeat every 15 min as needed until blood sugar is >100 mg/dl. If hypoglycemia persists then call 911.   6. Sick day management: a) Check blood glucose more often b) Continue usual therapy if blood sugars are elevated.   7. Contact the doctor immediately if blood glucose  is frequently <60 mg/dl, or an episode of severe hypoglycemia occurs (where someone had to give you glucose/  glucagon or if you passed out from a low blood glucose), or if blood glucose is persistently >350 mg/dl, for further management  8. A change in level of physical activity or exercise and a change in diet may also affect your blood sugar. Check blood sugars more often and call if needed.  Instructions: 1. Bring glucose meter, blood glucose records on every visit for review 2. Continue to follow up with primary care physician and other providers for medical care 3. Yearly eye  and foot exam 4. Please get blood work done prior to the next appointment

## 2023-09-20 NOTE — Patient Instructions (Signed)

## 2023-09-20 NOTE — Progress Notes (Signed)
 Outpatient Endocrinology Note [place], MD  09/20/23   Lindsey Bowen [birth year] [id number]  Referring Provider: Claiborne Rigg, NP Primary Care Provider: Claiborne Rigg, NP Reason for consultation: Subjective   Assessment & Plan  Diagnoses and all orders for this visit:  Uncontrolled type 2 diabetes mellitus with hyperglycemia (HCC)  Long-term (current) use of injectable non-insulin antidiabetic drugs  Long-term insulin use (HCC)  Pure hypercholesterolemia  Acquired hypothyroidism -     TSH(Reflex)  Other orders -     levothyroxine (SYNTHROID) 150 MCG tablet; Take 1 tablet (150 mcg total) by mouth daily.    Hypothyroidism, on levothyroxine 175 mcg po every day TSH 0.2 Decrease levothyroxineto 150 mcg po every day Recommend to take levothyroxine first thing in the morning on empty stomach and wait at least 30 minutes to 1 hour before eating or drinking anything or taking any other medications. Space out levothyroxine by 4 hours from any acid reflux medication, fibrate, iron, calcium, multivitamin, birth control pills and nutritional supplements.   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, TIA, No results found for: "GFR" Hba1c goal less than 7, current Hba1c is  Lab Results  Component Value Date   HGBA1C 9.9 (A) 07/22/2023   Will recommend the following: U500 insulin 115 units qam before break fast, 80 units before lunch, and 25 units before dinner units before supper-15 min before meals (takes BF around 6pm, lunch and midnight and dinenr around 3 am) Trulicity 4.5 mg weekly   Ordered and explained baqsimi/glucagon use and sent on 06/10/23 Jardiance gave UTI Couldn't tolerate metformin-tried different versions/doses for a year   Sees podiatry Dr Lindsey Bowen   No known contraindications/side effects to any of above medications No history of MEN syndrome/medullary thyroid cancer/pancreatitis or pancreatic cancer in self or family  -Last  LD and Tg are as follows: Lab Results  Component Value Date   LDLCALC 52 01/25/2023    Lab Results  Component Value Date   TRIG 95 01/25/2023   -On rosuvastatin 20 mg QD -Follow low fat diet and exercise   -Blood pressure goal <140/90 - Microalbumin/creatinine goal is < 30 -Last MA/Cr is as follows: Lab Results  Component Value Date   MICROALBUR 11.0 07/22/2023   -on ACE/ARB valsartan 320 mg qd -diet changes including salt restriction -limit eating outside -counseled BP targets per standards of diabetes care -uncontrolled blood pressure can lead to retinopathy, nephropathy and cardiovascular and atherosclerotic heart disease  Reviewed and counseled on: -A1C target -Blood sugar targets -Complications of uncontrolled diabetes  -Checking blood sugar before meals and bedtime and bring log next visit -All medications with mechanism of action and side effects -Hypoglycemia management: rule of 15's, Glucagon Emergency Kit and medical alert ID -low-carb low-fat plate-method diet -At least 20 minutes of physical activity per day -Annual dilated retinal eye exam and foot exam -compliance and follow up needs -follow up as scheduled or earlier if problem gets worse  Call if blood sugar is less than 70 or consistently above 250    Take a 15 gm snack of carbohydrate at bedtime before you go to sleep if your blood sugar is less than 100.    If you are going to fast after midnight for a test or procedure, ask your physician for instructions on how to reduce/decrease your insulin dose.    Call if blood sugar is less than 70 or consistently above 250  -Treating a low sugar by rule of 15  (15  gms of sugar every 15 min until sugar is more than 70) If you feel your sugar is low, test your sugar to be sure If your sugar is low (less than 70), then take 15 grams of a fast acting Carbohydrate (3-4 glucose tablets or glucose gel or 4 ounces of juice or regular soda) Recheck your sugar 15 min  after treating low to make sure it is more than 70 If sugar is still less than 70, treat again with 15 grams of carbohydrate          Don't drive the hour of hypoglycemia  If unconscious/unable to eat or drink by mouth, use glucagon injection or nasal spray baqsimi and call 911. Can repeat again in 15 min if still unconscious.  Return in about 4 weeks (around 10/18/2023).   I have reviewed current medications, nurse's notes, allergies, vital signs, past medical and surgical history, family medical history, and social history for this encounter. Counseled patient on symptoms, examination findings, lab findings, imaging results, treatment decisions and monitoring and prognosis. The patient understood the recommendations and agrees with the treatment plan. All questions regarding treatment plan were fully answered.  Lindsey Aiken, MD  09/20/23   History of Present Illness Lindsey Bowen is a 53 y.o. year old female who presents for follow up of Type II diabetes mellitus.  Lindsey Bowen was first diagnosed in 2012.   Diabetes education +  Home diabetes regimen: U500 insulin 105 units qam before break fast, 70 units before lunch, and 30 units before dinner units before supper-15 min before meals (takes BF around 6pm, lunch and midnight and dinenr around 3 am) Trulicity 4.5 mg weekly   Previously was on: Toujeo 80 units at bedtime  Humalog 25 units 2-3 times a day, 15 min before meals   COMPLICATIONS +  MI/Stroke/TIA -  retinopathy -  neuropathy +  nephropathy  BLOOD SUGAR DATA  CGM interpretation: At today's visit, we reviewed her CGM downloads. The full report is scanned in the media. Reviewing the CGM trends, BG are mainly elevated from 3pm-3am.  Physical Exam  BP 126/80   Pulse 87   Ht 5\' 5"  (1.651 m)   Wt 242 lb (109.8 kg)   SpO2 98%   BMI 40.27 kg/m    Constitutional: well developed, well nourished Head: normocephalic, atraumatic Eyes: sclera anicteric, no  redness Neck: supple Lungs: normal respiratory effort Neurology: alert and oriented Skin: dry, no appreciable rashes Musculoskeletal: no appreciable defects Psychiatric: normal mood and affect Diabetic Foot Exam - Simple   No data filed      Current Medications Patient's Medications  New Prescriptions   LEVOTHYROXINE (SYNTHROID) 150 MCG TABLET    Take 1 tablet (150 mcg total) by mouth daily.  Previous Medications   ACETAMINOPHEN (TYLENOL) 650 MG CR TABLET    Take 650 mg by mouth every 8 (eight) hours as needed for pain.   BLOOD PRESSURE MONITOR DEVI    Please provide patient with insurance approved blood pressure monitor ICD I 10.0   CHLORTHALIDONE (HYGROTON) 25 MG TABLET    Take 0.5 tablets (12.5 mg total) by mouth daily.   CICLOPIROX (PENLAC) 8 % SOLUTION    Apply topically at bedtime. Apply over nail and surrounding skin. Apply daily over previous coat. After seven (7) days, may remove with alcohol and continue cycle.   CONTINUOUS GLUCOSE SENSOR (DEXCOM G7 SENSOR) MISC    Apply 1 every 10 days   DULAGLUTIDE (TRULICITY) 4.5 MG/0.5ML SOAJ  Inject 4.5 mg as directed once a week.   EPINEPHRINE 0.3 MG/0.3 ML IJ SOAJ INJECTION    Inject 0.3 mg into the muscle as needed for anaphylaxis.   FLUTICASONE (FLONASE) 50 MCG/ACT NASAL SPRAY    Place 2 sprays into both nostrils daily as needed for allergies or rhinitis.   GLUCAGON (BAQSIMI ONE PACK) 3 MG/DOSE POWD    Place 1 Device into the nose as needed (Low blood sugar with impaired consciousness).   INSULIN PEN NEEDLE (B-D UF III MINI PEN NEEDLES) 31G X 5 MM MISC    Use as instructed. Check blood glucose level by fingerstick 5-6 TIMES per day.   INSULIN REGULAR HUMAN CONCENTRATED (HUMULIN R U-500 KWIKPEN) 500 UNIT/ML KWIKPEN    Inject 105 units every morning before breakfast, 70 units before lunch, and 30 units before supper-30 min before meals   LEVOCETIRIZINE (XYZAL) 5 MG TABLET    Take 5 mg by mouth daily as needed.   LEVONORGESTREL  (LILETTA, 52 MG,) 20.1 MCG/DAY IUD    1 each by Intrauterine route once.   LINACLOTIDE (LINZESS) 72 MCG CAPSULE    Take 1 capsule (72 mcg total) by mouth daily before breakfast.   ROSUVASTATIN (CRESTOR) 20 MG TABLET    Take 1 tablet (20 mg total) by mouth at bedtime.   SENNOSIDES-DOCUSATE SODIUM (SENOKOT-S) 8.6-50 MG TABLET    Take 2 tablets by mouth daily.   VALSARTAN (DIOVAN) 320 MG TABLET    Take 1 tablet (320 mg total) by mouth daily.   VENTOLIN HFA 108 (90 BASE) MCG/ACT INHALER    INHALE 1 TO 2 PUFFS INTO THE LUNGS EVERY 6 (SIX) HOURS AS NEEDED FOR WHEEZING OR SHORTNESS OF BREATH.   VITAMIN D, ERGOCALCIFEROL, (DRISDOL) 1.25 MG (50000 UNIT) CAPS CAPSULE    Take 1 capsule (50,000 Units total) by mouth once a week.  Modified Medications   No medications on file  Discontinued Medications   LEVOTHYROXINE (SYNTHROID) 175 MCG TABLET    Take 1 tablet (175 mcg total) by mouth daily before breakfast.    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    Past Surgical History Past Surgical History:  Procedure Laterality Date   CHOLECYSTECTOMY     DILATION AND CURETTAGE OF UTERUS     HERNIA REPAIR      Family History family history includes Addison's disease in her  mother; Asthma in her mother; Colon polyps in her mother; Diabetes in her maternal grandfather, mother, paternal grandmother, paternal uncle, and son; Glaucoma in her father and sister; Heart attack in her maternal aunt, maternal grandmother, and maternal  uncle; Hypertension in her mother; Irritable bowel syndrome in her daughter and mother; Mental retardation in her sister; Stomach cancer in her maternal grandfather; Stroke in her maternal grandmother; Ulcerative colitis in her maternal grandfather.  Social History Social History   Socioeconomic History   Marital status: Single    Spouse name: Not on file   Number of children: 3   Years of education: Not on file   Highest education level: Some college, no degree  Occupational History   Occupation: Counselling psychologist: [employer]    Comment: Works nights  Tobacco Use   Smoking status: Never   Smokeless tobacco: Never  Vaping Use   Vaping status: Never Used  Substance and Sexual Activity   Alcohol use: No   Drug use: Not Currently   Sexual activity: Yes    Partners: Male    Birth control/protection: I.U.D.    Comment: Liletta  Other Topics Concern   Not on file  Social History Narrative   Lives    Caffeine use:    Social Drivers of Health   Financial Resource Strain: Medium Risk (09/06/2023)   Overall Financial Resource Strain (CARDIA)    Difficulty of Paying Living Expenses: Somewhat hard  Food Insecurity: Food Insecurity Present (09/06/2023)   Hunger Vital Sign    Worried About Running Out of Food in the Last Year: Sometimes true    Ran Out of Food in the Last Year: Sometimes true  Transportation Needs: No Transportation Needs (09/06/2023)   PRAPARE - Administrator, Civil Service (Medical): No    Lack of Transportation (Non-Medical): No  Physical Activity: Insufficiently Active (08/29/2023)   Exercise Vital Sign    Days of Exercise per Week: 1 day    Minutes of Exercise per Session: 30 min   Stress: No Stress Concern Present (08/29/2023)   [employer] of Occupational Health - Occupational Stress Questionnaire    Feeling of Stress : Not at all  Social Connections: Moderately Isolated (08/29/2023)   Social Connection and Isolation Panel [NHANES]    Frequency of Communication with Friends and Family: More than three times a week    Frequency of Social Gatherings with Friends and Family: Never    Attends Religious Services: Never    Database administrator or Organizations: No    Attends Banker Meetings: 1 to 4 times per year    Marital Status: Never married  Intimate Partner Violence: Not At Risk (08/30/2022)   Received from [facility]   HITS    Over the last 12 months how often did your partner physically hurt you?: Never    Over the last 12 months how often did your partner insult you or talk down to you?: Never    Over the last 12 months how often did your partner threaten you with physical harm?: Never    Over the last 12 months how often did your partner scream or curse at you?: Never    Lab Results  Component Value Date   HGBA1C 9.9 (A) 07/22/2023   HGBA1C 12.3 (A) 05/02/2023   HGBA1C 11.2 (A) 01/25/2023   Lab Results  Component Value Date   CHOL 110 01/25/2023   Lab Results  Component Value Date   HDL 40 01/25/2023   Lab Results  Component Value Date   LDLCALC 52 01/25/2023   Lab Results  Component Value Date   TRIG 95 01/25/2023   Lab Results  Component  Value Date   CHOLHDL 2.8 01/25/2023   Lab Results  Component Value Date   CREATININE 1.08 (H) 07/22/2023   No results found for: "GFR" Lab Results  Component Value Date   MICROALBUR 11.0 07/22/2023      Component Value Date/Time   NA 140 07/22/2023 1123   NA 140 05/25/2023 1003   K 3.7 07/22/2023 1123   CL 101 07/22/2023 1123   CO2 30 07/22/2023 1123   GLUCOSE 228 (H) 07/22/2023 1123   BUN 14 07/22/2023 1123   BUN 20 05/25/2023 1003   CREATININE 1.08  (H) 07/22/2023 1123   CALCIUM 9.3 07/22/2023 1123   PROT 6.4 07/22/2023 1123   PROT 6.6 05/25/2023 1003   ALBUMIN 4.2 05/25/2023 1003   AST 12 07/22/2023 1123   ALT 16 07/22/2023 1123   ALKPHOS 91 05/25/2023 1003   BILITOT 0.3 07/22/2023 1123   BILITOT 0.4 05/25/2023 1003   GFRNONAA 61 03/28/2020 1702   GFRAA 71 03/28/2020 1702      Latest Ref Rng & Units 07/22/2023   11:23 AM 05/25/2023   10:03 AM 01/25/2023    9:56 AM  BMP  Glucose 65 - 99 mg/dL 161  096  045   BUN 7 - 25 mg/dL 14  20  11    Creatinine 0.50 - 1.03 mg/dL 4.09  8.11  9.14   BUN/Creat Ratio 6 - 22 (calc) 13  16  12    Sodium 135 - 146 mmol/L 140  140  138   Potassium 3.5 - 5.3 mmol/L 3.7  3.4  3.8   Chloride 98 - 110 mmol/L 101  98  98   CO2 20 - 32 mmol/L 30  29  23    Calcium 8.6 - 10.4 mg/dL 9.3  9.5  9.4        Component Value Date/Time   WBC 7.0 10/12/2022 1127   WBC 7.4 01/29/2021 0953   RBC 5.26 10/12/2022 1127   RBC 5.08 01/29/2021 0953   HGB 12.2 10/12/2022 1127   HCT 39.3 10/12/2022 1127   PLT 274 10/12/2022 1127   MCV 75 (L) 10/12/2022 1127   MCH 23.2 (L) 10/12/2022 1127   MCH 22.8 (L) 05/27/2017 0426   MCHC 31.0 (L) 10/12/2022 1127   MCHC 31.8 01/29/2021 0953   RDW 14.2 10/12/2022 1127   LYMPHSABS 2.8 10/12/2022 1127   MONOABS 0.4 01/29/2021 0953   EOSABS 0.2 10/12/2022 1127   BASOSABS 0.0 10/12/2022 1127     Parts of this note may have been dictated using voice recognition software. There may be variances in spelling and vocabulary which are unintentional. Not all errors are proofread. Please notify the Thereasa Parkin if any discrepancies are noted or if the meaning of any statement is not clear.

## 2023-09-22 NOTE — Progress Notes (Signed)
 Subjective: Chief Complaint  Patient presents with   Nail Problem    RM#11 Right foot second toe biopsy of black spot under nail.     53 year old female presents the office today mostly for biopsy of the skin lesion on the right second toe.  She states that she has noticed a dark spot getting a bit larger over time and due to this we discussed biopsy and she presents today for this.  She is now working her blood sugar the A1c is coming down.  No open lesions.  A1c is 9.9 on July 22, 2023  Objective: AAO x3, NAD DP/PT pulses palpable bilaterally, CRT less than 3 seconds  There is still hyperpigmented lesion noted distal aspect of right second toe.  Uniform color but she states it has gotten somewhat larger.  No pain with calf compression, swelling, warmth, erythema  Assessment: Hyperpigmented lesion   Plan: -All treatment options discussed with the patient including all alternatives, risks, complications.  -Given the hyperpigmented skin lesion which has been given a bit larger over time we discussed punch biopsy.  She is well aware of the risks we discussed last appointment again today especially given her diabetes and increased risk complications. She wishes to proceed and consent was signed.  I cleaned the skin with alcohol.  Mixture of 3 mL of lidocaine, Marcaine plain was infiltrated digital block fashion.  Once inside the prep was toe with Betadine.  Then utilized a 3 mm punch biopsy along the skin lesion.  This was removed and sent to the lab for further evaluation.  Irrigated with alcohol.  Antibiotic ointment was applied followed by dressing.  She tolerated the procedure well any complications.  Postprocedure instructions discussed.  Vivi Barrack DPM

## 2023-09-23 ENCOUNTER — Telehealth: Payer: Self-pay

## 2023-09-23 ENCOUNTER — Encounter: Payer: Self-pay | Admitting: "Endocrinology

## 2023-09-23 NOTE — Telephone Encounter (Signed)
 Copied from CRM [id number]. Topic: General - Other >> Sep 23, 2023  2:41 PM Emylou G wrote: Reason for CRM: [place] w/[employer] [phone] called checking status of Medical Record request.. could only verify patient name and dob.Lindsey Bowen Pls fax or call her.Lindsey Bowen

## 2023-09-27 ENCOUNTER — Other Ambulatory Visit: Payer: Self-pay | Admitting: Podiatry

## 2023-09-28 ENCOUNTER — Encounter: Payer: Self-pay | Admitting: Podiatry

## 2023-10-03 ENCOUNTER — Other Ambulatory Visit: Payer: Self-pay

## 2023-10-04 ENCOUNTER — Ambulatory Visit: Admitting: Podiatry

## 2023-10-04 ENCOUNTER — Other Ambulatory Visit: Payer: Self-pay

## 2023-10-04 DIAGNOSIS — M9913 Subluxation complex (vertebral) of lumbar region: Secondary | ICD-10-CM | POA: Diagnosis not present

## 2023-10-04 DIAGNOSIS — M9915 Subluxation complex (vertebral) of pelvic region: Secondary | ICD-10-CM | POA: Diagnosis not present

## 2023-10-04 DIAGNOSIS — M9914 Subluxation complex (vertebral) of sacral region: Secondary | ICD-10-CM | POA: Diagnosis not present

## 2023-10-04 DIAGNOSIS — D492 Neoplasm of unspecified behavior of bone, soft tissue, and skin: Secondary | ICD-10-CM

## 2023-10-04 DIAGNOSIS — M9911 Subluxation complex (vertebral) of cervical region: Secondary | ICD-10-CM | POA: Diagnosis not present

## 2023-10-05 ENCOUNTER — Encounter: Payer: Self-pay | Admitting: Podiatry

## 2023-10-05 NOTE — Progress Notes (Signed)
 Subjective: Chief Complaint  Patient presents with   Follow-up    RM#11 Follow up on right foot second toe biopsy completed.    53 year old female presents the office today for follow-up evaluation after going punch biopsy of the right second toe.  States that she is doing well.  She denies any pain recently denies any swelling redness or drainage.  No new concerns.  A1c is 9.9 on July 22, 2023  Objective: AAO x3, NAD DP/PT pulses palpable bilaterally, CRT less than 3 seconds It appears the procedure site is healed.  Still 1 small scab present there is no edema, erythema, drainage or pus or any signs of infection.  There are no other open lesions lymph at this time. No pain with calf compression, swelling, warmth, erythema  Assessment: Hyperpigmented lesion   Plan: -All treatment options discussed with the patient including all alternatives, risks, complications.  -Procedure site is healing well.  I reviewed the biopsy results again today.  Recommended washing with soap and water daily. Minimal scab still present but I expect this to come off shortly.  At there is any signs or symptoms of infection or issues, to let us know immediately.  Otherwise I will see her back on an as-needed basis for this.  Discussed possibly biopsying the future should there be any changes.  Return if symptoms worsen or fail to improve.  Vivi Barrack DPM

## 2023-10-06 ENCOUNTER — Other Ambulatory Visit: Payer: Self-pay | Admitting: Podiatry

## 2023-10-06 DIAGNOSIS — M2041 Other hammer toe(s) (acquired), right foot: Secondary | ICD-10-CM

## 2023-10-06 DIAGNOSIS — E1151 Type 2 diabetes mellitus with diabetic peripheral angiopathy without gangrene: Secondary | ICD-10-CM

## 2023-10-06 NOTE — Telephone Encounter (Signed)
 Britta Mccreedy- can you fax this order for diabetic shoes? Thanks!

## 2023-10-18 DIAGNOSIS — M9914 Subluxation complex (vertebral) of sacral region: Secondary | ICD-10-CM | POA: Diagnosis not present

## 2023-10-18 DIAGNOSIS — M9913 Subluxation complex (vertebral) of lumbar region: Secondary | ICD-10-CM | POA: Diagnosis not present

## 2023-10-18 DIAGNOSIS — M9915 Subluxation complex (vertebral) of pelvic region: Secondary | ICD-10-CM | POA: Diagnosis not present

## 2023-10-18 DIAGNOSIS — M9911 Subluxation complex (vertebral) of cervical region: Secondary | ICD-10-CM | POA: Diagnosis not present

## 2023-10-23 ENCOUNTER — Other Ambulatory Visit: Payer: Self-pay | Admitting: Nurse Practitioner

## 2023-10-23 DIAGNOSIS — E559 Vitamin D deficiency, unspecified: Secondary | ICD-10-CM

## 2023-10-24 ENCOUNTER — Other Ambulatory Visit: Payer: Self-pay

## 2023-10-27 ENCOUNTER — Encounter: Payer: Self-pay | Admitting: Nurse Practitioner

## 2023-10-28 ENCOUNTER — Other Ambulatory Visit: Payer: Self-pay

## 2023-10-31 ENCOUNTER — Encounter: Payer: Self-pay | Admitting: "Endocrinology

## 2023-10-31 ENCOUNTER — Ambulatory Visit: Admitting: "Endocrinology

## 2023-10-31 VITALS — BP 140/84 | HR 92 | Ht 65.0 in | Wt 250.0 lb

## 2023-10-31 DIAGNOSIS — Z7985 Long-term (current) use of injectable non-insulin antidiabetic drugs: Secondary | ICD-10-CM | POA: Diagnosis not present

## 2023-10-31 DIAGNOSIS — E7800 Pure hypercholesterolemia, unspecified: Secondary | ICD-10-CM | POA: Diagnosis not present

## 2023-10-31 DIAGNOSIS — E1165 Type 2 diabetes mellitus with hyperglycemia: Secondary | ICD-10-CM

## 2023-10-31 DIAGNOSIS — Z794 Long term (current) use of insulin: Secondary | ICD-10-CM | POA: Diagnosis not present

## 2023-10-31 DIAGNOSIS — E039 Hypothyroidism, unspecified: Secondary | ICD-10-CM

## 2023-10-31 LAB — POCT GLYCOSYLATED HEMOGLOBIN (HGB A1C): Hemoglobin A1C: 9.1 % — AB (ref 4.0–5.6)

## 2023-10-31 NOTE — Patient Instructions (Addendum)
 Will recommend the following: U500 insulin  130 units qam before break fast, 85-90 units before lunch, and 30 units before dinner units before supper-15 min before meals (takes BF around 6pm, lunch and midnight and dinenr around 3 am) Trulicity  4.5 mg weekly

## 2023-10-31 NOTE — Progress Notes (Signed)
 Outpatient Endocrinology Note Lindsey Newcomer, MD  10/31/23   Lindsey Bowen May 21, 1971 [id number]  Referring Provider: Collins Dean, NP Primary Care Provider: Collins Dean, NP Reason for consultation: Subjective   Assessment & Plan  Diagnoses and all orders for this visit:  Uncontrolled type 2 diabetes mellitus with hyperglycemia (HCC) -     POCT glycosylated hemoglobin (Hb A1C) -     TSH + free T4  Long-term (current) use of injectable non-insulin  antidiabetic drugs  Long-term insulin  use (HCC)  Pure hypercholesterolemia  Acquired hypothyroidism   Hypothyroidism, on levothyroxine  175 mcg po every day, TSH was 0.2 On levothyroxine  to 150 mcg po every day, ordered TSH Recommend to take levothyroxine  first thing in the morning on empty stomach and wait at least 30 minutes to 1 hour before eating or drinking anything or taking any other medications. Space out levothyroxine  by 4 hours from any acid reflux medication, fibrate, iron, calcium , multivitamin, birth control pills and nutritional supplements.   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, TIA, No results found for: "GFR" Hba1c goal less than 7, current Hba1c is  Lab Results  Component Value Date   HGBA1C 9.9 (A) 07/22/2023   Will recommend the following: U500 insulin  130 units qam before break fast, 85-90 units before lunch, and 30 units before dinner units before supper-15 min before meals (takes BF around 6pm, lunch and midnight and dinenr around 3 am) Trulicity  4.5 mg weekly   Ordered and explained baqsimi /glucagon  use and sent on 06/10/23 Jardiance gave UTI Couldn't tolerate metformin-tried different versions/doses for a year   Sees podiatry Dr Charity Conch   No known contraindications/side effects to any of above medications No history of MEN syndrome/medullary thyroid  cancer/pancreatitis or pancreatic cancer in self or family  -Last LD and Tg are as follows: Lab Results  Component  Value Date   LDLCALC 52 01/25/2023    Lab Results  Component Value Date   TRIG 95 01/25/2023   -On rosuvastatin  20 mg QD -Follow low fat diet and exercise   -Blood pressure goal <140/90 - Microalbumin/creatinine goal is < 30 -Last MA/Cr is as follows: Lab Results  Component Value Date   MICROALBUR 11.0 07/22/2023   -on ACE/ARB valsartan  320 mg qd -diet changes including salt restriction -limit eating outside -counseled BP targets per standards of diabetes care -uncontrolled blood pressure can lead to retinopathy, nephropathy and cardiovascular and atherosclerotic heart disease  Reviewed and counseled on: -A1C target -Blood sugar targets -Complications of uncontrolled diabetes  -Checking blood sugar before meals and bedtime and bring log next visit -All medications with mechanism of action and side effects -Hypoglycemia management: rule of 15's, Glucagon  Emergency Kit and medical alert ID -low-carb low-fat plate-method diet -At least 20 minutes of physical activity per day -Annual dilated retinal eye exam and foot exam -compliance and follow up needs -follow up as scheduled or earlier if problem gets worse  Call if blood sugar is less than 70 or consistently above 250    Take a 15 gm snack of carbohydrate at bedtime before you go to sleep if your blood sugar is less than 100.    If you are going to fast after midnight for a test or procedure, ask your physician for instructions on how to reduce/decrease your insulin  dose.    Call if blood sugar is less than 70 or consistently above 250  -Treating a low sugar by rule of 15  (15 gms of sugar every 15 min  until sugar is more than 70) If you feel your sugar is low, test your sugar to be sure If your sugar is low (less than 70), then take 15 grams of a fast acting Carbohydrate (3-4 glucose tablets or glucose gel or 4 ounces of juice or regular soda) Recheck your sugar 15 min after treating low to make sure it is more than  70 If sugar is still less than 70, treat again with 15 grams of carbohydrate          Don't drive the hour of hypoglycemia  If unconscious/unable to eat or drink by mouth, use glucagon  injection or nasal spray baqsimi  and call 911. Can repeat again in 15 min if still unconscious.  Return in about 30 days (around 11/30/2023) for visit + labs before next visit.   I have reviewed current medications, nurse's notes, allergies, vital signs, past medical and surgical history, family medical history, and social history for this encounter. Counseled patient on symptoms, examination findings, lab findings, imaging results, treatment decisions and monitoring and prognosis. The patient understood the recommendations and agrees with the treatment plan. All questions regarding treatment plan were fully answered.  Lindsey Newcomer, MD  10/31/23   History of Present Illness Lindsey Bowen is a 53 y.o. year old female who presents for follow up of Type II diabetes mellitus.  [name] was first diagnosed in 2012.   Diabetes education +  Home diabetes regimen: U500 insulin  115 units qam before break fast, 80 units before lunch, and 30 units before dinner units before supper-15 min before meals (takes BF around 6pm, lunch and midnight and dinenr around 3 am) Trulicity  4.5 mg weekly   Previously was on: Toujeo  80 units at bedtime  Humalog  25 units 2-3 times a day, 15 min before meals   COMPLICATIONS +  MI/Stroke/TIA -  retinopathy -  neuropathy +  nephropathy  BLOOD SUGAR DATA  CGM interpretation: At today's visit, we reviewed her CGM downloads. The full report is scanned in the media. Reviewing the CGM trends, BG are mainly elevated from 1pm-3am.  Physical Exam  BP (!) 140/84   Pulse 92   Ht 5\' 5"  (1.651 m)   Wt 250 lb (113.4 kg)   SpO2 96%   BMI 41.60 kg/m    Constitutional: well developed, well nourished Head: normocephalic, atraumatic Eyes: sclera anicteric, no redness Neck:  supple Lungs: normal respiratory effort Neurology: alert and oriented Skin: dry, no appreciable rashes Musculoskeletal: no appreciable defects Psychiatric: normal mood and affect Diabetic Foot Exam - Simple   No data filed      Current Medications Patient's Medications  New Prescriptions   No medications on file  Previous Medications   ACETAMINOPHEN  (TYLENOL ) 650 MG CR TABLET    Take 650 mg by mouth every 8 (eight) hours as needed for pain.   BLOOD PRESSURE MONITOR DEVI    Please provide patient with insurance approved blood pressure monitor ICD I 10.0   CHLORTHALIDONE  (HYGROTON ) 25 MG TABLET    Take 0.5 tablets (12.5 mg total) by mouth daily.   CICLOPIROX  (PENLAC ) 8 % SOLUTION    Apply topically at bedtime. Apply over nail and surrounding skin. Apply daily over previous coat. After seven (7) days, may remove with alcohol and continue cycle.   CONTINUOUS GLUCOSE SENSOR (DEXCOM G7 SENSOR) MISC    Apply 1 every 10 days   DOXYCYCLINE  (VIBRA -TABS) 100 MG TABLET    Take 1 tablet (100 mg total) by mouth 2 (two)  times daily.   DULAGLUTIDE  (TRULICITY ) 4.5 MG/0.5ML SOAJ    Inject 4.5 mg as directed once a week.   EPINEPHRINE  0.3 MG/0.3 ML IJ SOAJ INJECTION    Inject 0.3 mg into the muscle as needed for anaphylaxis.   FLUTICASONE  (FLONASE ) 50 MCG/ACT NASAL SPRAY    Place 2 sprays into both nostrils daily as needed for allergies or rhinitis.   GLUCAGON  (BAQSIMI  ONE PACK) 3 MG/DOSE POWD    Place 1 Device into the nose as needed (Low blood sugar with impaired consciousness).   INSULIN  PEN NEEDLE (B-D UF III MINI PEN NEEDLES) 31G X 5 MM MISC    Use as instructed. Check blood glucose level by fingerstick 5-6 TIMES per day.   INSULIN  REGULAR HUMAN CONCENTRATED (HUMULIN  R U-500 KWIKPEN) 500 UNIT/ML KWIKPEN    Inject 105 units every morning before breakfast, 70 units before lunch, and 30 units before supper-30 min before meals   LEVOCETIRIZINE (XYZAL) 5 MG TABLET    Take 5 mg by mouth daily as needed.    LEVONORGESTREL  (LILETTA , 52 MG,) 20.1 MCG/DAY IUD    1 each by Intrauterine route once.   LEVOTHYROXINE  (SYNTHROID ) 150 MCG TABLET    Take 1 tablet (150 mcg total) by mouth daily.   LINACLOTIDE  (LINZESS ) 72 MCG CAPSULE    Take 1 capsule (72 mcg total) by mouth daily before breakfast.   ROSUVASTATIN  (CRESTOR ) 20 MG TABLET    Take 1 tablet (20 mg total) by mouth at bedtime.   SENNOSIDES-DOCUSATE SODIUM  (SENOKOT-S) 8.6-50 MG TABLET    Take 2 tablets by mouth daily.   VALSARTAN  (DIOVAN ) 320 MG TABLET    Take 1 tablet (320 mg total) by mouth daily.   VENTOLIN  HFA 108 (90 BASE) MCG/ACT INHALER    INHALE 1 TO 2 PUFFS INTO THE LUNGS EVERY 6 (SIX) HOURS AS NEEDED FOR WHEEZING OR SHORTNESS OF BREATH.   VITAMIN D , ERGOCALCIFEROL , (DRISDOL ) 1.25 MG (50000 UNIT) CAPS CAPSULE    Take 1 capsule (50,000 Units total) by mouth once a week.  Modified Medications   No medications on file  Discontinued Medications   No medications on file    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    Past Surgical History Past Surgical History:  Procedure Laterality Date   CHOLECYSTECTOMY     DILATION AND CURETTAGE OF UTERUS     HERNIA REPAIR      Family History family history includes  Addison's disease in her mother; Asthma in her mother; Colon polyps in her mother; Diabetes in her maternal grandfather, mother, paternal grandmother, paternal uncle, and son;  Glaucoma in her father and sister; Heart attack in her maternal aunt, maternal grandmother, and maternal uncle; Hypertension in her mother; Irritable bowel syndrome in her daughter and mother; Mental retardation in her sister; Stomach cancer in her maternal grandfather; Stroke in her maternal grandmother; Ulcerative colitis in her maternal grandfather.  Social History Social History   Socioeconomic History   Marital status: Single    Spouse name: Not on file   Number of children: 3   Years of education: Not on file   Highest education level: Some college, no degree  Occupational History   Occupation: Counselling psychologist: [employer]    Comment: Works nights  Tobacco Use   Smoking status: Never   Smokeless tobacco: Never  Vaping Use   Vaping status: Never Used  Substance and Sexual Activity   Alcohol use: No   Drug use: Not Currently   Sexual activity: Yes    Partners: Male    Birth control/protection: I.U.D.    Comment: Liletta   Other Topics Concern   Not on file  Social History Narrative   Lives    Caffeine use:    Social Drivers of Health   Financial Resource Strain: Medium Risk (09/06/2023)   Overall Financial Resource Strain (CARDIA)    Difficulty of Paying Living Expenses: Somewhat hard  Food Insecurity: Food Insecurity Present (09/06/2023)   Hunger Vital Sign    Worried About Running Out of Food in the Last Year: Sometimes true    Ran Out of Food in the Last Year: Sometimes true  Transportation Needs: No Transportation Needs (09/06/2023)   PRAPARE - Administrator, Civil Service (Medical): No    Lack of Transportation (Non-Medical): No  Physical Activity: Insufficiently Active (08/29/2023)   Exercise Vital Sign    Days of Exercise per Week: 1 day    Minutes of Exercise  per Session: 30 min  Stress: No Stress Concern Present (08/29/2023)   [employer] of Occupational Health - Occupational Stress Questionnaire    Feeling of Stress : Not at all  Social Connections: Moderately Isolated (08/29/2023)   Social Connection and Isolation Panel [NHANES]    Frequency of Communication with Friends and Family: More than three times a week    Frequency of Social Gatherings with Friends and Family: Never    Attends Religious Services: Never    Database administrator or Organizations: No    Attends Banker Meetings: 1 to 4 times per year    Marital Status: Never married  Intimate Partner Violence: Not At Risk (08/30/2022)   Received from [facility], [facility]   HITS    Over the last 12 months how often did your partner physically hurt you?: Never    Over the last 12 months how often did your partner insult you or talk down to you?: Never    Over the last 12 months how often did your partner threaten you with physical harm?: Never    Over the last 12 months how often did your partner scream or curse at you?: Never    Lab Results  Component Value Date   HGBA1C 9.9 (A) 07/22/2023   HGBA1C 12.3 (A) 05/02/2023   HGBA1C 11.2 (A) 01/25/2023   Lab Results  Component Value Date   CHOL 110 01/25/2023   Lab Results  Component Value Date   HDL 40 01/25/2023   Lab Results  Component Value Date   LDLCALC 52 01/25/2023   Lab  Results  Component Value Date   TRIG 95 01/25/2023   Lab Results  Component Value Date   CHOLHDL 2.8 01/25/2023   Lab Results  Component Value Date   CREATININE 1.08 (H) 07/22/2023   No results found for: "GFR" Lab Results  Component Value Date   MICROALBUR 11.0 07/22/2023      Component Value Date/Time   NA 140 07/22/2023 1123   NA 140 05/25/2023 1003   K 3.7 07/22/2023 1123   CL 101 07/22/2023 1123   CO2 30 07/22/2023 1123   GLUCOSE 228 (H) 07/22/2023 1123   BUN 14 07/22/2023 1123   BUN 20 05/25/2023 1003    CREATININE 1.08 (H) 07/22/2023 1123   CALCIUM  9.3 07/22/2023 1123   PROT 6.4 07/22/2023 1123   PROT 6.6 05/25/2023 1003   ALBUMIN 4.2 05/25/2023 1003   AST 12 07/22/2023 1123   ALT 16 07/22/2023 1123   ALKPHOS 91 05/25/2023 1003   BILITOT 0.3 07/22/2023 1123   BILITOT 0.4 05/25/2023 1003   GFRNONAA 61 03/28/2020 1702   GFRAA 71 03/28/2020 1702      Latest Ref Rng & Units 07/22/2023   11:23 AM 05/25/2023   10:03 AM 01/25/2023    9:56 AM  BMP  Glucose 65 - 99 mg/dL 161  096  045   BUN 7 - 25 mg/dL 14  20  11    Creatinine 0.50 - 1.03 mg/dL 4.09  8.11  9.14   BUN/Creat Ratio 6 - 22 (calc) 13  16  12    Sodium 135 - 146 mmol/L 140  140  138   Potassium 3.5 - 5.3 mmol/L 3.7  3.4  3.8   Chloride 98 - 110 mmol/L 101  98  98   CO2 20 - 32 mmol/L 30  29  23    Calcium  8.6 - 10.4 mg/dL 9.3  9.5  9.4        Component Value Date/Time   WBC 7.0 10/12/2022 1127   WBC 7.4 01/29/2021 0953   RBC 5.26 10/12/2022 1127   RBC 5.08 01/29/2021 0953   HGB 12.2 10/12/2022 1127   HCT 39.3 10/12/2022 1127   PLT 274 10/12/2022 1127   MCV 75 (L) 10/12/2022 1127   MCH 23.2 (L) 10/12/2022 1127   MCH 22.8 (L) 05/27/2017 0426   MCHC 31.0 (L) 10/12/2022 1127   MCHC 31.8 01/29/2021 0953   RDW 14.2 10/12/2022 1127   LYMPHSABS 2.8 10/12/2022 1127   MONOABS 0.4 01/29/2021 0953   EOSABS 0.2 10/12/2022 1127   BASOSABS 0.0 10/12/2022 1127     Parts of this note may have been dictated using voice recognition software. There may be variances in spelling and vocabulary which are unintentional. Not all errors are proofread. Please notify the [facility] if any discrepancies are noted or if the meaning of any statement is not clear.

## 2023-11-01 ENCOUNTER — Encounter: Payer: Self-pay | Admitting: "Endocrinology

## 2023-11-03 DIAGNOSIS — M7918 Myalgia, other site: Secondary | ICD-10-CM | POA: Diagnosis not present

## 2023-11-03 DIAGNOSIS — M47896 Other spondylosis, lumbar region: Secondary | ICD-10-CM | POA: Diagnosis not present

## 2023-11-03 DIAGNOSIS — M545 Low back pain, unspecified: Secondary | ICD-10-CM | POA: Diagnosis not present

## 2023-11-04 ENCOUNTER — Other Ambulatory Visit: Payer: Self-pay | Admitting: Medical Genetics

## 2023-11-04 ENCOUNTER — Other Ambulatory Visit: Payer: Self-pay

## 2023-11-04 ENCOUNTER — Encounter (HOSPITAL_COMMUNITY): Payer: Self-pay

## 2023-11-11 ENCOUNTER — Other Ambulatory Visit: Payer: Self-pay

## 2023-11-11 ENCOUNTER — Ambulatory Visit: Attending: Nurse Practitioner | Admitting: Nurse Practitioner

## 2023-11-11 ENCOUNTER — Encounter: Payer: Self-pay | Admitting: Nurse Practitioner

## 2023-11-11 VITALS — BP 117/74 | HR 85 | Resp 20 | Ht 65.0 in | Wt 251.0 lb

## 2023-11-11 DIAGNOSIS — I10 Essential (primary) hypertension: Secondary | ICD-10-CM | POA: Diagnosis not present

## 2023-11-11 DIAGNOSIS — G4733 Obstructive sleep apnea (adult) (pediatric): Secondary | ICD-10-CM | POA: Diagnosis not present

## 2023-11-11 DIAGNOSIS — G8929 Other chronic pain: Secondary | ICD-10-CM

## 2023-11-11 DIAGNOSIS — E7800 Pure hypercholesterolemia, unspecified: Secondary | ICD-10-CM

## 2023-11-11 DIAGNOSIS — R0609 Other forms of dyspnea: Secondary | ICD-10-CM | POA: Diagnosis not present

## 2023-11-11 DIAGNOSIS — M5442 Lumbago with sciatica, left side: Secondary | ICD-10-CM

## 2023-11-11 MED ORDER — CHLORTHALIDONE 25 MG PO TABS
12.5000 mg | ORAL_TABLET | Freq: Every day | ORAL | 1 refills | Status: DC
  Filled 2023-11-11 – 2023-11-26 (×4): qty 45, 90d supply, fill #0
  Filled 2024-02-06: qty 45, 90d supply, fill #1

## 2023-11-11 NOTE — Progress Notes (Addendum)
 Lindsey Bowen was seen today for hypertension.  Diagnoses and all orders for this visit:  Dyspnea on exertion -     Ambulatory referral to Cardiology -     Brain natriuretic peptide  Primary hypertension -     chlorthalidone  (HYGROTON ) 25 MG tablet; Take 0.5 tablets (12.5 mg total) by mouth daily. -     CMP14+EGFR Continue all antihypertensives as prescribed.  Reminded to bring in blood pressure log for follow  up appointment.  RECOMMENDATIONS: DASH/Mediterranean Diets are healthier choices for HTN.    Hypercholesterolemia -     Lipid panel INSTRUCTIONS: Work on a low fat, heart healthy diet and participate in regular aerobic exercise program by working out at least 150 minutes per week; 5 days a week-30 minutes per day. Avoid red meat/beef/steak,  fried foods. junk foods, sodas, sugary drinks, unhealthy snacking, alcohol and smoking.  Drink at least 80 oz of water per day and monitor your carbohydrate intake daily.    OSA (obstructive sleep apnea) -     Home sleep test  Chronic low back pain Back brace ordered    Chief Complaint  Patient presents with   Hypertension    Lindsey Bowen 53 y.o. female presents to office today with concerns of DOE   She has a history of HTN, DM, Asthma, OSA and thyroid  disorder She has a history of sleep apnea but she reports was diagnosed during COVID and due to to the requirement of having numerous COVID test she was lost to follow-up for the CPAP titration study.     She reports chronic dyspnea on exertion along with intermittent chest pain and BLE edema with negative cardiology workup in the past.  We also ordered an echocardiogram a few years ago which was also normal.  She has a history of TIA, OSA (no CPAP), poorly controlled diabetes and thyroid  disease. BMI 41 She feels she is constantly retaining fluid and reports diet does not include excess sodium. Last A1c >9. BP Readings from Last 3 Encounters:  11/11/23 117/74  10/31/23 (!) 140/84   09/20/23 126/80   Lab Results  Component Value Date   HGBA1C 9.1 (A) 10/31/2023        OSA She was diagnosed with sleep apnea several years ago. Never received a CPAP. She has Obesity. Current symptoms of daytime fatigue, morning fatigue, and hypertension. Patient generally gets 4 or 5 hours of sleep per night, and states they generally have nightime awakenings. Snoring of moderate severity is present. Apneic episodes are present. Nasal obstruction is not present.  Patient has not had tonsillectomy.   Chronic low back pain She has chronic low back pain. Followed by ortho. Pain is worse with prolonged standing, washing dishes, preparing dinner, doing laundry. Relieving factors: sitting/rest/back brace.  Previous treatments: bilateral L3, 4, 5 medial branch radiofrequency ablation, trigger point injections, gabapentin , lyrica, meloxicam, methocarbamol, physical therapy and home exercise program. No radiating pain down the legs. Review of Systems  Constitutional:  Negative for fever, malaise/fatigue and weight loss.  HENT: Negative.  Negative for nosebleeds.   Eyes: Negative.  Negative for blurred vision, double vision and photophobia.  Respiratory:  Positive for shortness of breath. Negative for cough, hemoptysis, sputum production and wheezing.   Cardiovascular: Negative.  Negative for chest pain, palpitations and leg swelling.  Gastrointestinal: Negative.  Negative for heartburn, nausea and vomiting.  Musculoskeletal:  Positive for back pain. Negative for myalgias.  Neurological: Negative.  Negative for dizziness, focal weakness, seizures and headaches.  Psychiatric/Behavioral:  Negative.  Negative for suicidal ideas.     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    Past Surgical History:  Procedure Laterality Date   CHOLECYSTECTOMY     DILATION AND CURETTAGE OF UTERUS     HERNIA REPAIR      Family History  Problem Relation Age of Onset   Diabetes Mother    Hypertension Mother    Addison's disease Mother    Asthma Mother    Colon polyps Mother    Irritable bowel syndrome Mother    Glaucoma Father    Mental retardation Sister    Glaucoma Sister    Heart attack Maternal Aunt    Heart attack Maternal Uncle    Diabetes Paternal Uncle    Heart attack Maternal Grandmother    Stroke Maternal Grandmother    Ulcerative colitis Maternal Grandfather    Diabetes Maternal Grandfather    Stomach cancer Maternal Grandfather    Diabetes Paternal Grandmother    Irritable bowel syndrome Daughter    Diabetes Son    Colon cancer Neg Hx    Esophageal cancer Neg Hx    Rectal cancer Neg Hx     Social History Reviewed with no changes to be made today.   Outpatient Medications Prior to Visit  Medication Sig Dispense Refill   Continuous Glucose Sensor (DEXCOM G7 SENSOR) MISC Apply 1 every 10 days 9 each 1   Dulaglutide  (TRULICITY ) 4.5 MG/0.5ML SOAJ Inject 4.5 mg as directed once a week. 6 mL 1   EPINEPHrine  0.3 mg/0.3 mL IJ SOAJ injection Inject 0.3 mg into the muscle as needed for anaphylaxis. 2 each 0   fluticasone  (FLONASE ) 50 MCG/ACT nasal spray Place 2 sprays into both nostrils daily as needed for allergies or rhinitis.     Glucagon  (BAQSIMI  ONE PACK) 3 MG/DOSE POWD Place 1 Device into the nose as needed (Low blood sugar with impaired consciousness). 2 each 3   Insulin  Pen  Needle (B-D UF III MINI PEN NEEDLES) 31G X 5 MM MISC Use as instructed. Check blood glucose level by fingerstick 5-6 TIMES per day. 200 each 6   levocetirizine (XYZAL) 5 MG tablet Take 5 mg by mouth daily as needed.     levothyroxine  (SYNTHROID ) 150 MCG tablet Take 1 tablet (150 mcg total) by mouth daily. 90 tablet 1   linaclotide  (LINZESS ) 72 MCG capsule Take 1 capsule (72 mcg total) by mouth daily before breakfast. 90 capsule 1   rosuvastatin  (CRESTOR ) 20 MG tablet Take 1 tablet (20 mg total) by mouth at bedtime. 90 tablet 1   sennosides-docusate sodium  (SENOKOT-S) 8.6-50 MG tablet Take 2 tablets by mouth daily. 180 tablet 1   valsartan  (DIOVAN ) 320 MG tablet Take 1 tablet (320  mg total) by mouth daily. 90 tablet 3   VENTOLIN  HFA 108 (90 Base) MCG/ACT inhaler INHALE 1 TO 2 PUFFS INTO THE LUNGS EVERY 6 (SIX) HOURS AS NEEDED FOR WHEEZING OR SHORTNESS OF BREATH. 54 g 1   Vitamin D , Ergocalciferol , (DRISDOL ) 1.25 MG (50000 UNIT) CAPS capsule Take 1 capsule (50,000 Units total) by mouth once a week. 12 capsule 0   chlorthalidone  (HYGROTON ) 25 MG tablet Take 0.5 tablets (12.5 mg total) by mouth daily. 45 tablet 1   acetaminophen  (TYLENOL ) 650 MG CR tablet Take 650 mg by mouth every 8 (eight) hours as needed for pain. (Patient not taking: Reported on 11/11/2023)     Blood Pressure Monitor DEVI Please provide patient with insurance approved blood pressure monitor ICD I 10.0 (Patient not taking: Reported on 11/11/2023) 1 each 0   ciclopirox  (PENLAC ) 8 % solution Apply topically at bedtime. Apply over nail and surrounding skin. Apply daily over previous coat. After seven (7) days, may remove with alcohol and continue cycle. (Patient not taking: Reported on 11/11/2023) 6.6 mL 2   doxycycline  (VIBRA -TABS) 100 MG tablet Take 1 tablet (100 mg total) by mouth 2 (two) times daily. (Patient not taking: Reported on 11/11/2023) 14 tablet 0   insulin  regular human CONCENTRATED (HUMULIN  R U-500 KWIKPEN) 500 UNIT/ML KwikPen  Inject 105 units every morning before breakfast, 70 units before lunch, and 30 units before supper-30 min before meals (Patient taking differently: Inject 115 units every morning before breakfast, 70 units before lunch, and 30 units before supper-30 min before meals) 45 mL 3   levonorgestrel  (LILETTA , 52 MG,) 20.1 MCG/DAY IUD 1 each by Intrauterine route once.     No facility-administered medications prior to visit.    Allergies  Allergen Reactions   Dust Mite Extract Hives and Itching   Shellfish Allergy Anaphylaxis       Objective:    BP 117/74 (BP Location: Left Arm, Patient Position: Sitting, Cuff Size: Normal)   Pulse 85   Resp 20   Ht 5\' 5"  (1.651 m)   Wt 251 lb (113.9 kg)   SpO2 100%   BMI 41.77 kg/m  Wt Readings from Last 3 Encounters:  11/11/23 251 lb (113.9 kg)  10/31/23 250 lb (113.4 kg)  09/20/23 242 lb (109.8 kg)    Physical Exam Vitals and nursing note reviewed.  Constitutional:      Appearance: She is well-developed. She is obese.  HENT:     Head: Normocephalic and atraumatic.  Cardiovascular:     Rate and Rhythm: Normal rate and regular rhythm.     Heart sounds: Normal heart sounds. No murmur heard.    No friction rub. No gallop.  Pulmonary:     Effort: Pulmonary effort is normal. No tachypnea or respiratory distress.     Breath sounds: Normal breath sounds. No decreased breath sounds, wheezing, rhonchi or rales.  Chest:     Chest wall: No tenderness.  Abdominal:     General: Bowel sounds are normal.     Palpations: Abdomen is soft.  Musculoskeletal:        General: Normal range of motion.     Cervical back: Normal range of motion.     Right lower leg: Edema present.     Left lower leg: Edema present.  Skin:    General: Skin is warm and dry.  Neurological:     Mental Status: She is alert and oriented to person, place, and time.     Coordination: Coordination normal.  Psychiatric:        Behavior: Behavior normal. Behavior is cooperative.         Thought Content: Thought content normal.        Judgment: Judgment normal.          Patient has been counseled extensively about nutrition and exercise as well as the importance of adherence with medications and regular follow-up. The patient was given clear instructions to go to ER or return to medical center if symptoms don't improve, worsen or new problems develop. The patient verbalized understanding.   Follow-up: Return in about 3 months (around 02/14/2024).   Collins Dean, FNP-BC [facility] [place], [place] [phone]   11/11/2023, 5:27 PM

## 2023-11-12 ENCOUNTER — Ambulatory Visit: Payer: Self-pay | Admitting: Nurse Practitioner

## 2023-11-14 ENCOUNTER — Other Ambulatory Visit: Payer: Self-pay | Admitting: Nurse Practitioner

## 2023-11-14 DIAGNOSIS — E559 Vitamin D deficiency, unspecified: Secondary | ICD-10-CM

## 2023-11-14 LAB — LIPID PANEL
Chol/HDL Ratio: 3 ratio (ref 0.0–4.4)
Cholesterol, Total: 124 mg/dL (ref 100–199)
HDL: 41 mg/dL (ref >39)
LDL Chol Calc (NIH): 67 mg/dL (ref 0–99)
Triglycerides: 79 mg/dL (ref 0–149)
VLDL Cholesterol Cal: 16 mg/dL (ref 5–40)

## 2023-11-14 LAB — CMP14+EGFR
ALT: 27 IU/L (ref 0–32)
AST: 19 IU/L (ref 0–40)
Albumin: 4.1 g/dL (ref 3.8–4.9)
Alkaline Phosphatase: 56 IU/L (ref 44–121)
BUN/Creatinine Ratio: 11 (ref 9–23)
BUN: 12 mg/dL (ref 6–24)
Bilirubin Total: 0.3 mg/dL (ref 0.0–1.2)
CO2: 26 mmol/L (ref 20–29)
Calcium: 9.2 mg/dL (ref 8.7–10.2)
Chloride: 100 mmol/L (ref 96–106)
Creatinine, Ser: 1.05 mg/dL — ABNORMAL HIGH (ref 0.57–1.00)
Globulin, Total: 2.1 g/dL (ref 1.5–4.5)
Glucose: 220 mg/dL — ABNORMAL HIGH (ref 70–99)
Potassium: 3.8 mmol/L (ref 3.5–5.2)
Sodium: 140 mmol/L (ref 134–144)
Total Protein: 6.2 g/dL (ref 6.0–8.5)
eGFR: 64 mL/min/{1.73_m2} (ref >59)

## 2023-11-14 LAB — BRAIN NATRIURETIC PEPTIDE: BNP: 3.3 pg/mL (ref 0.0–100.0)

## 2023-11-15 ENCOUNTER — Other Ambulatory Visit

## 2023-11-15 ENCOUNTER — Other Ambulatory Visit: Payer: Self-pay

## 2023-11-15 DIAGNOSIS — Z006 Encounter for examination for normal comparison and control in clinical research program: Secondary | ICD-10-CM

## 2023-11-22 LAB — GENECONNECT MOLECULAR SCREEN: Genetic Analysis Overall Interpretation: NEGATIVE

## 2023-11-25 DIAGNOSIS — M2042 Other hammer toe(s) (acquired), left foot: Secondary | ICD-10-CM | POA: Diagnosis not present

## 2023-11-25 DIAGNOSIS — E1165 Type 2 diabetes mellitus with hyperglycemia: Secondary | ICD-10-CM | POA: Diagnosis not present

## 2023-11-25 DIAGNOSIS — M2041 Other hammer toe(s) (acquired), right foot: Secondary | ICD-10-CM | POA: Diagnosis not present

## 2023-11-26 ENCOUNTER — Other Ambulatory Visit (HOSPITAL_BASED_OUTPATIENT_CLINIC_OR_DEPARTMENT_OTHER): Payer: Self-pay

## 2023-11-26 ENCOUNTER — Other Ambulatory Visit: Payer: Self-pay

## 2023-11-26 ENCOUNTER — Encounter: Payer: Self-pay | Admitting: Nurse Practitioner

## 2023-11-27 NOTE — Telephone Encounter (Signed)
 Please fax prescription to company below. Thank you

## 2023-11-27 NOTE — Addendum Note (Signed)
 Addended by: Inell Mimbs on: 11/27/2023 12:18 AM   Modules accepted: Orders

## 2023-11-29 ENCOUNTER — Other Ambulatory Visit

## 2023-11-29 ENCOUNTER — Ambulatory Visit: Admitting: Nurse Practitioner

## 2023-11-29 ENCOUNTER — Other Ambulatory Visit: Payer: Self-pay

## 2023-11-29 DIAGNOSIS — E1165 Type 2 diabetes mellitus with hyperglycemia: Secondary | ICD-10-CM | POA: Diagnosis not present

## 2023-11-29 LAB — TSH+FREE T4: TSH W/REFLEX TO FT4: 2.05 m[IU]/L

## 2023-12-06 ENCOUNTER — Encounter: Payer: Self-pay | Admitting: "Endocrinology

## 2023-12-06 ENCOUNTER — Telehealth: Admitting: "Endocrinology

## 2023-12-06 DIAGNOSIS — E039 Hypothyroidism, unspecified: Secondary | ICD-10-CM

## 2023-12-06 DIAGNOSIS — E7800 Pure hypercholesterolemia, unspecified: Secondary | ICD-10-CM | POA: Diagnosis not present

## 2023-12-06 DIAGNOSIS — Z794 Long term (current) use of insulin: Secondary | ICD-10-CM

## 2023-12-06 DIAGNOSIS — Z7985 Long-term (current) use of injectable non-insulin antidiabetic drugs: Secondary | ICD-10-CM | POA: Diagnosis not present

## 2023-12-06 DIAGNOSIS — E1165 Type 2 diabetes mellitus with hyperglycemia: Secondary | ICD-10-CM

## 2023-12-06 NOTE — Patient Instructions (Addendum)
  Will recommend the following: U500 insulin  140-150 units qam before break fast, 85-90 units before lunch, and 25 units before dinner units before supper-30 min before meals  Take 1/3 of the U500 dose if missing a meal and blood sugar is >200 Trulicity  4.5 mg weekly

## 2023-12-06 NOTE — Progress Notes (Signed)
 The patient reports they are currently: [place]. I spent 12-13 minutes on the video with the patient on the date of service. I spent an additional 5 minutes on pre- and post-visit activities on the date of service.   The patient was physically located in [place]  or a state in which I am permitted to provide care. The patient and/or parent/guardian understood that s/he may incur co-pays and cost sharing, and agreed to the telemedicine visit. The visit was reasonable and appropriate under the circumstances given the patient's presentation at the time.  The patient and/or parent/guardian has been advised of the potential risks and limitations of this mode of treatment (including, but not limited to, the absence of in-person examination) and has agreed to be treated using telemedicine. The patient's/patient's family's questions regarding telemedicine have been answered.   The patient and/or parent/guardian has also been advised to contact their provider's office for worsening conditions, and seek emergency medical treatment and/or call 911 if the patient deems either necessary.    Outpatient Endocrinology Note Lindsey Newcomer, MD  12/06/23   Lindsey Bowen 10/19/1970 [id number]  Referring Provider: Collins Dean, NP Primary Care Provider: Collins Dean, NP Reason for consultation: Subjective   Assessment & Plan  Diagnoses and all orders for this visit:  Uncontrolled type 2 diabetes mellitus with hyperglycemia (HCC)  Long-term (current) use of injectable non-insulin  antidiabetic drugs  Long-term insulin  use (HCC)  Pure hypercholesterolemia  Acquired hypothyroidism    Hypothyroidism, on levothyroxine  175 mcg po every day, TSH was 0.2 On levothyroxine  to 150 mcg po every day, TSH is 2 Continue to take levothyroxine  first thing in the morning on empty stomach and wait at least 30 minutes to 1 hour before eating or drinking anything or taking any other medications. Space out  levothyroxine  by 4 hours from any acid reflux medication, fibrate, iron, calcium , multivitamin, birth control pills and nutritional supplements.   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, TIA, No results found for: "GFR" Hba1c goal less than 7, current Hba1c is  Lab Results  Component Value Date   HGBA1C 9.1 (A) 10/31/2023   Will recommend the following:  Trulicity  4.5 mg weekly  U500 insulin  140-150 units qam before break fast, 85-90 units before lunch, and 25 units before dinner units before supper-15 min before meals  Take 1/3 of the U500 dose if missing a meal and blood sugar is >200 Trulicity  4.5 mg weekly  (takes BF around 6pm, lunch and midnight and dinenr around 3 am) Missing doses when missing meals Instructed to cut down sugary drinks  Ordered and explained baqsimi /glucagon  use and sent on 06/10/23 Jardiance gave UTI Couldn't tolerate metformin-tried different versions/doses for a year   Sees podiatry Dr Charity Conch   No known contraindications/side effects to any of above medications No history of MEN syndrome/medullary thyroid  cancer/pancreatitis or pancreatic cancer in self or family  -Last LD and Tg are as follows: Lab Results  Component Value Date   LDLCALC 67 11/11/2023    Lab Results  Component Value Date   TRIG 79 11/11/2023   -On rosuvastatin  20 mg QD -Follow low fat diet and exercise   -Blood pressure goal <140/90 - Microalbumin/creatinine goal is < 30 -Last MA/Cr is as follows: Lab Results  Component Value Date   MICROALBUR 11.0 07/22/2023   -on ACE/ARB valsartan  320 mg qd -diet changes including salt restriction -limit eating outside -counseled BP targets per standards of diabetes care -uncontrolled blood pressure can lead to retinopathy,  nephropathy and cardiovascular and atherosclerotic heart disease  Reviewed and counseled on: -A1C target -Blood sugar targets -Complications of uncontrolled diabetes  -Checking blood sugar  before meals and bedtime and bring log next visit -All medications with mechanism of action and side effects -Hypoglycemia management: rule of 15's, Glucagon  Emergency Kit and medical alert ID -low-carb low-fat plate-method diet -At least 20 minutes of physical activity per day -Annual dilated retinal eye exam and foot exam -compliance and follow up needs -follow up as scheduled or earlier if problem gets worse  Call if blood sugar is less than 70 or consistently above 250    Take a 15 gm snack of carbohydrate at bedtime before you go to sleep if your blood sugar is less than 100.    If you are going to fast after midnight for a test or procedure, ask your physician for instructions on how to reduce/decrease your insulin  dose.    Call if blood sugar is less than 70 or consistently above 250  -Treating a low sugar by rule of 15  (15 gms of sugar every 15 min until sugar is more than 70) If you feel your sugar is low, test your sugar to be sure If your sugar is low (less than 70), then take 15 grams of a fast acting Carbohydrate (3-4 glucose tablets or glucose gel or 4 ounces of juice or regular soda) Recheck your sugar 15 min after treating low to make sure it is more than 70 If sugar is still less than 70, treat again with 15 grams of carbohydrate          Don't drive the hour of hypoglycemia  If unconscious/unable to eat or drink by mouth, use glucagon  injection or nasal spray baqsimi  and call 911. Can repeat again in 15 min if still unconscious.  Return in about 3 weeks (around 12/27/2023) for tele-visit.   I have reviewed current medications, nurse's notes, allergies, vital signs, past medical and surgical history, family medical history, and social history for this encounter. Counseled patient on symptoms, examination findings, lab findings, imaging results, treatment decisions and monitoring and prognosis. The patient understood the recommendations and agrees with the treatment plan.  All questions regarding treatment plan were fully answered.  Lindsey Newcomer, MD  12/06/23   History of Present Illness Lindsey Bowen is a 53 y.o. year old female who presents for follow up of Type II diabetes mellitus.  Lindsey Bowen was first diagnosed in 2012.   Diabetes education +  Home diabetes regimen: U500 insulin  130 units qam before break fast, 85-90 units before lunch, and 30 units before dinner units before supper-15 min before meals (takes BF around 6pm, lunch and midnight and dinenr around 3 am) Trulicity  4.5 mg weekly   Previously was on: Toujeo  80 units at bedtime  Humalog  25 units 2-3 times a day, 15 min before meals   COMPLICATIONS +  MI/Stroke/TIA -  retinopathy -  neuropathy +  nephropathy  BLOOD SUGAR DATA  CGM interpretation: At today's visit, we reviewed her CGM downloads. The full report is scanned in the media. Reviewing the CGM trends, BG are elevated across the day except 5am-1pm.  Physical Exam  There were no vitals taken for this visit.   Constitutional: well developed, well nourished Head: normocephalic, atraumatic Eyes: sclera anicteric, no redness Neck: supple Lungs: normal respiratory effort Neurology: alert and oriented Skin: dry, no appreciable rashes Musculoskeletal: no appreciable defects Psychiatric: normal mood and affect Diabetic Foot Exam - Simple  No data filed      Current Medications Patient's Medications  New Prescriptions   No medications on file  Previous Medications   BLOOD PRESSURE MONITOR DEVI    Please provide patient with insurance approved blood pressure monitor ICD I 10.0   CHLORTHALIDONE  (HYGROTON ) 25 MG TABLET    Take 0.5 tablets (12.5 mg total) by mouth daily.   CONTINUOUS GLUCOSE SENSOR (DEXCOM G7 SENSOR) MISC    Apply 1 every 10 days   DULAGLUTIDE  (TRULICITY ) 4.5 MG/0.5ML SOAJ    Inject 4.5 mg as directed once a week.   EASY COMFORT PEN NEEDLES 31G X 5 MM MISC    USE AS INSTRUCTED. CHECK BLOOD  GLUCOSE LEVEL BY FINGERSTICK 5 TO 6 TIMES PER DAY.   EPINEPHRINE  0.3 MG/0.3 ML IJ SOAJ INJECTION    Inject 0.3 mg into the muscle as needed for anaphylaxis.   FLUTICASONE  (FLONASE ) 50 MCG/ACT NASAL SPRAY    Place 2 sprays into both nostrils daily as needed for allergies or rhinitis.   GLUCAGON  (BAQSIMI  ONE PACK) 3 MG/DOSE POWD    Place 1 Device into the nose as needed (Low blood sugar with impaired consciousness).   INSULIN  REGULAR HUMAN CONCENTRATED (HUMULIN  R U-500 KWIKPEN) 500 UNIT/ML KWIKPEN    Inject 105 units every morning before breakfast, 70 units before lunch, and 30 units before supper-30 min before meals   LEVOCETIRIZINE (XYZAL) 5 MG TABLET    Take 5 mg by mouth daily as needed.   LEVONORGESTREL  (LILETTA , 52 MG,) 20.1 MCG/DAY IUD    1 each by Intrauterine route once.   LEVOTHYROXINE  (SYNTHROID ) 150 MCG TABLET    Take 1 tablet (150 mcg total) by mouth daily.   LINACLOTIDE  (LINZESS ) 72 MCG CAPSULE    Take 1 capsule (72 mcg total) by mouth daily before breakfast.   ROSUVASTATIN  (CRESTOR ) 20 MG TABLET    Take 1 tablet (20 mg total) by mouth at bedtime.   SENNOSIDES-DOCUSATE SODIUM  (SENOKOT-S) 8.6-50 MG TABLET    Take 2 tablets by mouth daily.   VALSARTAN  (DIOVAN ) 320 MG TABLET    Take 1 tablet (320 mg total) by mouth daily.   VENTOLIN  HFA 108 (90 BASE) MCG/ACT INHALER    INHALE 1 TO 2 PUFFS INTO THE LUNGS EVERY 6 (SIX) HOURS AS NEEDED FOR WHEEZING OR SHORTNESS OF BREATH.   VITAMIN D , ERGOCALCIFEROL , (DRISDOL ) 1.25 MG (50000 UNIT) CAPS CAPSULE    TAKE 1 CAPSULE (50,000 UNITS TOTAL) BY MOUTH ONCE A WEEK.  Modified Medications   No medications on file  Discontinued Medications   No medications on file    Allergies Allergies  Allergen Reactions   Dust Mite Extract Hives and Itching   Shellfish Allergy Anaphylaxis    Past Medical History Past Medical History:  Diagnosis Date   Anemia    Anxiety    Asthma    Bilateral edema of lower extremity    Chronic pain of left knee     Constipation    Depression    Diabetes mellitus type 2 with neurological manifestations (HCC) 05/27/2017   Diabetes mellitus without complication (HCC)    Gallstones    Hypothyroidism    Joint pain    Lumbar pain    Migraine    Onychodystrophy    Onychomycosis    Other fatigue    Paresthesia 05/17/2017   Shortness of breath on exertion    Sickle cell trait (HCC)    Sleep apnea    TIA (transient ischemic attack) 05/26/2017  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    Past Surgical History Past Surgical History:  Procedure Laterality Date   CHOLECYSTECTOMY     DILATION AND CURETTAGE OF UTERUS     HERNIA REPAIR      Family History family history includes Addison's disease in her mother; Asthma in her mother; Colon polyps in her mother; Diabetes in her maternal grandfather, mother, paternal grandmother, paternal uncle, and son; Glaucoma in her father and sister; Heart attack in her maternal aunt, maternal grandmother, and maternal uncle; Hypertension in her mother; Irritable bowel syndrome in her daughter and mother; Mental retardation in her sister; Stomach cancer in her maternal grandfather; Stroke in her maternal grandmother; Ulcerative colitis in her maternal grandfather.  Social History Social History   Socioeconomic History   Marital status: Single    Spouse name: Not on file   Number of children: 3   Years of education: Not on file   Highest education level: Some college, no degree  Occupational History   Occupation: Counselling psychologist: [employer]    Comment: Works nights  Tobacco Use   Smoking status: Never   Smokeless tobacco: Never  Vaping Use   Vaping status: Never Used  Substance and Sexual Activity   Alcohol use: No   Drug use: Not Currently   Sexual activity: Yes    Partners: Male    Birth control/protection: I.U.D.    Comment: Liletta   Other Topics Concern   Not on file  Social History Narrative   Lives     Caffeine use:    Social Drivers of Health   Financial Resource Strain: Medium Risk (09/06/2023)   Overall Financial Resource Strain (CARDIA)    Difficulty of Paying Living Expenses: Somewhat hard  Food Insecurity: Food Insecurity Present (09/06/2023)   Hunger Vital Sign    Worried About Running Out of Food in the Last Year: Sometimes true    Ran Out of Food in the Last Year: Sometimes true  Transportation Needs: No Transportation Needs (09/06/2023)   PRAPARE - Administrator, Civil Service (Medical): No    Lack of Transportation (Non-Medical): No  Physical Activity: Insufficiently Active (08/29/2023)   Exercise Vital Sign    Days of Exercise per Week: 1 day    Minutes of Exercise per Session: 30 min  Stress: No Stress Concern Present (08/29/2023)   [employer] of Occupational Health - Occupational Stress Questionnaire    Feeling of Stress : Not at all  Social Connections: Moderately Isolated (08/29/2023)   Social Connection and Isolation Panel [NHANES]    Frequency of Communication with Friends and Family: More than three times a week    Frequency of Social Gatherings with Friends and Family: Never    Attends Religious Services: Never    Database administrator or Organizations: No    Attends Engineer, structural: 1 to 4 times per year    Marital Status: Never married  Intimate Partner Violence: Not At Risk (08/30/2022)   Received from [employer], [facility]   HITS    Over the last 12 months how often did your partner physically hurt you?: Never    Over the last 12 months how often did your partner insult you or talk down to you?: Never    Over the last 12 months how often did your partner threaten you with physical harm?: Never    Over the last 12 months how often did your partner  scream or curse at you?: Never    Lab Results  Component Value Date   HGBA1C 9.1 (A) 10/31/2023   HGBA1C 9.9 (A) 07/22/2023   HGBA1C 12.3 (A) 05/02/2023   Lab Results   Component Value Date   CHOL 124 11/11/2023   Lab Results  Component Value Date   HDL 41 11/11/2023   Lab Results  Component Value Date   LDLCALC 67 11/11/2023   Lab Results  Component Value Date   TRIG 79 11/11/2023   Lab Results  Component Value Date   CHOLHDL 3.0 11/11/2023   Lab Results  Component Value Date   CREATININE 1.05 (H) 11/11/2023   No results found for: "GFR" Lab Results  Component Value Date   MICROALBUR 11.0 07/22/2023      Component Value Date/Time   NA 140 11/11/2023 1643   K 3.8 11/11/2023 1643   CL 100 11/11/2023 1643   CO2 26 11/11/2023 1643   GLUCOSE 220 (H) 11/11/2023 1643   GLUCOSE 228 (H) 07/22/2023 1123   BUN 12 11/11/2023 1643   CREATININE 1.05 (H) 11/11/2023 1643   CREATININE 1.08 (H) 07/22/2023 1123   CALCIUM  9.2 11/11/2023 1643   PROT 6.2 11/11/2023 1643   ALBUMIN 4.1 11/11/2023 1643   AST 19 11/11/2023 1643   ALT 27 11/11/2023 1643   ALKPHOS 56 11/11/2023 1643   BILITOT 0.3 11/11/2023 1643   GFRNONAA 61 03/28/2020 1702   GFRAA 71 03/28/2020 1702      Latest Ref Rng & Units 11/11/2023    4:43 PM 07/22/2023   11:23 AM 05/25/2023   10:03 AM  BMP  Glucose 70 - 99 mg/dL 098  119  147   BUN 6 - 24 mg/dL 12  14  20    Creatinine 0.57 - 1.00 mg/dL 8.29  5.62  1.30   BUN/Creat Ratio 9 - 23 11  13  16    Sodium 134 - 144 mmol/L 140  140  140   Potassium 3.5 - 5.2 mmol/L 3.8  3.7  3.4   Chloride 96 - 106 mmol/L 100  101  98   CO2 20 - 29 mmol/L 26  30  29    Calcium  8.7 - 10.2 mg/dL 9.2  9.3  9.5        Component Value Date/Time   WBC 7.0 10/12/2022 1127   WBC 7.4 01/29/2021 0953   RBC 5.26 10/12/2022 1127   RBC 5.08 01/29/2021 0953   HGB 12.2 10/12/2022 1127   HCT 39.3 10/12/2022 1127   PLT 274 10/12/2022 1127   MCV 75 (L) 10/12/2022 1127   MCH 23.2 (L) 10/12/2022 1127   MCH 22.8 (L) 05/27/2017 0426   MCHC 31.0 (L) 10/12/2022 1127   MCHC 31.8 01/29/2021 0953   RDW 14.2 10/12/2022 1127   LYMPHSABS 2.8 10/12/2022 1127    MONOABS 0.4 01/29/2021 0953   EOSABS 0.2 10/12/2022 1127   BASOSABS 0.0 10/12/2022 1127     Parts of this note may have been dictated using voice recognition software. There may be variances in spelling and vocabulary which are unintentional. Not all errors are proofread. Please notify the [facility] if any discrepancies are noted or if the meaning of any statement is not clear.

## 2023-12-19 ENCOUNTER — Other Ambulatory Visit: Payer: Self-pay | Admitting: Nurse Practitioner

## 2023-12-19 ENCOUNTER — Other Ambulatory Visit: Payer: Self-pay | Admitting: "Endocrinology

## 2023-12-19 ENCOUNTER — Other Ambulatory Visit: Payer: Self-pay

## 2023-12-19 DIAGNOSIS — E118 Type 2 diabetes mellitus with unspecified complications: Secondary | ICD-10-CM

## 2023-12-19 MED ORDER — TRULICITY 4.5 MG/0.5ML ~~LOC~~ SOAJ
4.5000 mg | SUBCUTANEOUS | 1 refills | Status: DC
  Filled 2023-12-19: qty 6, 84d supply, fill #0
  Filled 2024-03-13: qty 6, 84d supply, fill #1

## 2023-12-19 NOTE — Telephone Encounter (Signed)
 Requested Prescriptions   Pending Prescriptions Disp Refills   Dulaglutide  (TRULICITY ) 4.5 MG/0.5ML SOAJ 6 mL 1    Sig: Inject 4.5 mg as directed once a week.

## 2023-12-22 ENCOUNTER — Other Ambulatory Visit: Payer: Self-pay

## 2023-12-26 ENCOUNTER — Other Ambulatory Visit: Payer: Self-pay | Admitting: Nurse Practitioner

## 2023-12-26 DIAGNOSIS — I10 Essential (primary) hypertension: Secondary | ICD-10-CM

## 2023-12-29 ENCOUNTER — Other Ambulatory Visit: Payer: Self-pay

## 2024-01-12 ENCOUNTER — Other Ambulatory Visit (HOSPITAL_COMMUNITY)
Admission: RE | Admit: 2024-01-12 | Discharge: 2024-01-12 | Disposition: A | Discharge status: Home / Self Care | Source: Ambulatory Visit | Attending: Obstetrics and Gynecology | Admitting: Obstetrics and Gynecology

## 2024-01-12 ENCOUNTER — Other Ambulatory Visit: Payer: Self-pay

## 2024-01-12 ENCOUNTER — Encounter: Payer: Self-pay | Admitting: Obstetrics and Gynecology

## 2024-01-12 ENCOUNTER — Ambulatory Visit: Admitting: Obstetrics and Gynecology

## 2024-01-12 VITALS — BP 121/78 | HR 94 | Wt 255.5 lb

## 2024-01-12 DIAGNOSIS — Z1151 Encounter for screening for human papillomavirus (HPV): Secondary | ICD-10-CM | POA: Insufficient documentation

## 2024-01-12 DIAGNOSIS — Z01419 Encounter for gynecological examination (general) (routine) without abnormal findings: Secondary | ICD-10-CM | POA: Insufficient documentation

## 2024-01-12 DIAGNOSIS — Z113 Encounter for screening for infections with a predominantly sexual mode of transmission: Secondary | ICD-10-CM

## 2024-01-12 DIAGNOSIS — Z124 Encounter for screening for malignant neoplasm of cervix: Secondary | ICD-10-CM

## 2024-01-12 NOTE — Progress Notes (Signed)
 ANNUAL EXAM Patient name: Lindsey Bowen MRN [id number]  Date of birth: Jan 21, 1971 Chief Complaint:   Gynecologic Exam  History of Present Illness:   Lindsey Bowen is a 53 y.o. [phone] being seen today for a routine annual exam.  Current complaints: was told by PCP that women over 50 are menopausal but not having symptoms  Menstrual concerns? No  IUD in place Breast or nipple changes? No  Contraception use? Yes IUD in place Sexually active? No not since November 2024  No hot flashes, no vaginal dryness. No issues with mood swings or irritability. Mother had hysterectomy and grandmother had menses 40. IUDs since 2016; current lilietta since 2022. In place to keep lining thin and keep polyps away. Previously used nuvaring for endometrial lining control.   No LMP recorded. (Menstrual status: IUD).   The pregnancy intention screening data noted above was reviewed. Potential methods of contraception were discussed. The patient elected to proceed with No data recorded.   Last pap     Component Value Date/Time   DIAGPAP  06/23/2022 1133    - Negative for intraepithelial lesion or malignancy (NILM)   DIAGPAP  06/01/2019 1012    - Negative for intraepithelial lesion or malignancy (NILM)   HPVHIGH Negative 06/23/2022 1133   HPVHIGH Negative 06/01/2019 1012   ADEQPAP  06/23/2022 1133    Satisfactory for evaluation; transformation zone component PRESENT.   ADEQPAP  06/01/2019 1012    Satisfactory for evaluation; transformation zone component PRESENT.   Last mammogram: 01/2023 BIRADS 1.  Last colonoscopy: 2022.      08/29/2023   10:00 AM 05/25/2023    9:42 AM 01/25/2023    8:50 AM 10/12/2022   10:20 AM 06/23/2022   12:29 PM  Depression screen PHQ 2/9  Decreased Interest 0 0 0 0 0  Down, Depressed, Hopeless 0 0 0 0 0  PHQ - 2 Score 0 0 0 0 0  Altered sleeping    1 3  Tired, decreased energy    1 2  Change in appetite    0 0  Feeling bad or failure about yourself     0 0   Trouble concentrating    0 0  Moving slowly or fidgety/restless    0 0  Suicidal thoughts    0 0  PHQ-9 Score    2 5        08/29/2023   10:00 AM 10/12/2022   10:20 AM 06/23/2022   12:29 PM 02/12/2022    3:40 PM  GAD 7 : Generalized Anxiety Score  Nervous, Anxious, on Edge 0 0 0 0  Control/stop worrying 0 0 0 0  Worry too much - different things 0 0 0 0  Trouble relaxing 0 0 0 0  Restless 0 0 0 0  Easily annoyed or irritable 0 0 0 0  Afraid - awful might happen 0 0 0 0  Total GAD 7 Score 0 0 0 0     Review of Systems:   Pertinent items are noted in HPI Denies any headaches, blurred vision, fatigue, shortness of breath, chest pain, abdominal pain, abnormal vaginal discharge/itching/odor/irritation, problems with periods, bowel movements, urination, or intercourse unless otherwise stated above. Pertinent History Reviewed:  Reviewed past medical,surgical, social and family history.  Reviewed problem list, medications and allergies. Physical Assessment:   Vitals:   01/12/24 0856  BP: 121/78  Pulse: 94  Weight: 255 lb 8 oz (115.9 kg)  Body mass index is 42.52 kg/m.  Physical Examination:   General appearance - well appearing, and in no distress  Mental status - alert, oriented to person, place, and time  Psych:  She has a normal mood and affect  Skin - warm and dry, normal color, no suspicious lesions noted  Chest - effort normal, all lung fields clear to auscultation bilaterally  Heart - normal rate and regular rhythm  Abdomen - soft, nontender, nondistended, no masses or organomegaly  Pelvic -  VULVA: normal appearing vulva with no masses, tenderness or lesions   VAGINA: normal appearing vagina with normal color and discharge, no lesions   CERVIX: normal appearing cervix without discharge or lesions, no CMT  Thin prep pap is done with HR HPV cotesting  UTERUS: uterus is felt to be normal size, shape, consistency and nontender   ADNEXA: No adnexal masses or  tenderness noted.  Extremities:  No swelling or varicosities noted  Chaperone present for exam  No results found for this or any previous visit (from the past 24 hours).    Assessment & Plan:  1. Well woman exam with routine gynecological exam (Primary) - Cervical cancer screening: Discussed guidelines. Pap with HPV collected - Breast Health: Encouraged self breast awareness/SBE. Discussed limits of clinical breast exam for detecting breast cancer. Discussed importance of annual MXR. scheduled - Climacteric/Sexual health: Reviewed typical and atypical symptoms of menopause/peri-menopause. Discussed PMB and to call if any amount of spotting.  - FSH to assess if elevated and consistent with menopause; noted likely perimenopausal given FH - Colonoscopy: up to date - F/U 12 months and prn  - RPR+HBsAg+HCVAb+... - FSH - Cytology - PAP  2. Screening for cervical cancer collected - Cytology - PAP  3. Screening examination for STI collected - RPR+HBsAg+HCVAb+... - Cervicovaginal ancillary only  Orders Placed This Encounter  Procedures   RPR+HBsAg+HCVAb+...   FSH    Meds: No orders of the defined types were placed in this encounter.   Follow-up: No follow-ups on file.  Carter Quarry, MD 01/12/2024 9:04 AM

## 2024-01-13 ENCOUNTER — Ambulatory Visit: Payer: Self-pay | Admitting: Obstetrics and Gynecology

## 2024-01-13 DIAGNOSIS — B9689 Other specified bacterial agents as the cause of diseases classified elsewhere: Secondary | ICD-10-CM

## 2024-01-13 LAB — RPR+HBSAG+HCVAB+...
HIV Screen 4th Generation wRfx: NONREACTIVE
Hep C Virus Ab: NONREACTIVE
Hepatitis B Surface Ag: NEGATIVE
RPR Ser Ql: NONREACTIVE

## 2024-01-13 LAB — CERVICOVAGINAL ANCILLARY ONLY
Bacterial Vaginitis (gardnerella): POSITIVE — AB
Candida Glabrata: NEGATIVE
Candida Vaginitis: NEGATIVE
Chlamydia: NEGATIVE
Comment: NEGATIVE
Comment: NEGATIVE
Comment: NEGATIVE
Comment: NEGATIVE
Comment: NEGATIVE
Comment: NORMAL
Neisseria Gonorrhea: NEGATIVE
Trichomonas: NEGATIVE

## 2024-01-13 LAB — FOLLICLE STIMULATING HORMONE: FSH: 28.4 m[IU]/mL

## 2024-01-13 MED ORDER — METRONIDAZOLE 500 MG PO TABS
500.0000 mg | ORAL_TABLET | Freq: Two times a day (BID) | ORAL | 0 refills | Status: AC
Start: 2024-01-13 — End: 2024-01-20

## 2024-01-17 ENCOUNTER — Other Ambulatory Visit: Payer: Self-pay

## 2024-01-18 ENCOUNTER — Other Ambulatory Visit: Payer: Self-pay

## 2024-01-18 DIAGNOSIS — M545 Low back pain, unspecified: Secondary | ICD-10-CM | POA: Diagnosis not present

## 2024-01-18 DIAGNOSIS — M5432 Sciatica, left side: Secondary | ICD-10-CM | POA: Diagnosis not present

## 2024-01-18 LAB — CYTOLOGY - PAP
Comment: NEGATIVE
Diagnosis: UNDETERMINED — AB
High risk HPV: NEGATIVE

## 2024-01-23 ENCOUNTER — Other Ambulatory Visit: Payer: Self-pay

## 2024-01-24 ENCOUNTER — Other Ambulatory Visit: Payer: Self-pay | Admitting: Nurse Practitioner

## 2024-01-24 ENCOUNTER — Other Ambulatory Visit: Payer: Self-pay

## 2024-01-24 DIAGNOSIS — E118 Type 2 diabetes mellitus with unspecified complications: Secondary | ICD-10-CM

## 2024-01-24 MED FILL — Continuous Glucose System Sensor: 30 days supply | Qty: 3 | Fill #0 | Status: AC

## 2024-01-24 MED FILL — Insulin Pen Needle 31 G X 5 MM (1/5" or 3/16"): 33 days supply | Qty: 200 | Fill #0 | Status: AC

## 2024-01-25 ENCOUNTER — Other Ambulatory Visit: Payer: Self-pay

## 2024-01-30 DIAGNOSIS — Z1231 Encounter for screening mammogram for malignant neoplasm of breast: Secondary | ICD-10-CM | POA: Diagnosis not present

## 2024-01-30 LAB — HM MAMMOGRAPHY

## 2024-01-31 ENCOUNTER — Telehealth: Admitting: "Endocrinology

## 2024-01-31 ENCOUNTER — Encounter: Payer: Self-pay | Admitting: "Endocrinology

## 2024-01-31 ENCOUNTER — Encounter: Payer: Self-pay | Admitting: Nurse Practitioner

## 2024-01-31 VITALS — Ht 65.0 in | Wt 250.0 lb

## 2024-01-31 DIAGNOSIS — E7800 Pure hypercholesterolemia, unspecified: Secondary | ICD-10-CM | POA: Diagnosis not present

## 2024-01-31 DIAGNOSIS — E1165 Type 2 diabetes mellitus with hyperglycemia: Secondary | ICD-10-CM | POA: Diagnosis not present

## 2024-01-31 DIAGNOSIS — Z794 Long term (current) use of insulin: Secondary | ICD-10-CM

## 2024-01-31 DIAGNOSIS — Z7985 Long-term (current) use of injectable non-insulin antidiabetic drugs: Secondary | ICD-10-CM

## 2024-01-31 DIAGNOSIS — E039 Hypothyroidism, unspecified: Secondary | ICD-10-CM | POA: Diagnosis not present

## 2024-01-31 NOTE — Progress Notes (Signed)
 The patient reports they are currently: Lindsey Bowen. I spent 5-6 minutes on the video with the patient on the date of service. I spent an additional 5 minutes on pre- and post-visit activities on the date of service.   The patient was physically located in [place]  or a state in which I am permitted to provide care. The patient and/or parent/guardian understood that s/he may incur co-pays and cost sharing, and agreed to the telemedicine visit. The visit was reasonable and appropriate under the circumstances given the patient's presentation at the time.  The patient and/or parent/guardian has been advised of the potential risks and limitations of this mode of treatment (including, but not limited to, the absence of in-person examination) and has agreed to be treated using telemedicine. The patient's/patient's family's questions regarding telemedicine have been answered.   The patient and/or parent/guardian has also been advised to contact their provider's office for worsening conditions, and seek emergency medical treatment and/or call 911 if the patient deems either necessary.    Outpatient Endocrinology Note Lindsey Birmingham, MD  01/31/24   Lindsey Bowen [birth year] [id number]  Referring Provider: Theotis Haze ORN, NP Primary Care Provider: Theotis Haze ORN, NP Reason for consultation: Subjective   Assessment & Plan  Diagnoses and all orders for this visit:  Uncontrolled type 2 diabetes mellitus with hyperglycemia (HCC)  Long-term (current) use of injectable non-insulin  antidiabetic drugs  Long-term insulin  use (HCC)  Pure hypercholesterolemia  Acquired hypothyroidism     Hypothyroidism, on levothyroxine  175 mcg po every day, TSH was 0.2 On levothyroxine  to 150 mcg po every day, TSH is WNL Continue to take levothyroxine  first thing in the morning on empty stomach and wait at least 30 minutes to 1 hour before eating or drinking anything or taking any other medications. Space  out levothyroxine  by 4 hours from any acid reflux medication, fibrate, iron, calcium , multivitamin, birth control pills and nutritional supplements.   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, TIA, No results found for: GFR Hba1c goal less than 7, current Hba1c is  Lab Results  Component Value Date   HGBA1C 9.1 (A) 10/31/2023   Will recommend the following:  Trulicity  4.5 mg weekly  U500 insulin  160-170 units qam before break fast, 90 units before lunch--30 min before meals-stop dinner dose (30 units before supper  Take 1/3 of the U500 dose if missing a meal and blood sugar is >200 Trulicity  4.5 mg weekly  (takes BF around 6pm, lunch at midnight and dinner around 3 am) Previously discussed, Missing doses when missing meals Instructed to cut down sugary drinks  Ordered and explained baqsimi /glucagon  use and sent on 06/10/23 Jardiance gave UTI Couldn't tolerate metformin-tried different versions/doses for a year   Sees podiatry Dr Donnice JONELLE Fees   No known contraindications/side effects to any of above medications No history of MEN syndrome/medullary thyroid  cancer/pancreatitis or pancreatic cancer in self or family  -Last LD and Tg are as follows: Lab Results  Component Value Date   LDLCALC 67 11/11/2023    Lab Results  Component Value Date   TRIG 79 11/11/2023   -On rosuvastatin  20 mg QD -Follow low fat diet and exercise   -Blood pressure goal <140/90 - Microalbumin/creatinine goal is < 30 -Last MA/Cr is as follows: Lab Results  Component Value Date   MICROALBUR 11.0 07/22/2023   -on ACE/ARB valsartan  320 mg qd -diet changes including salt restriction -limit eating outside -counseled BP targets per standards of diabetes care -uncontrolled blood pressure can lead to  retinopathy, nephropathy and cardiovascular and atherosclerotic heart disease  Reviewed and counseled on: -A1C target -Blood sugar targets -Complications of uncontrolled diabetes  -Checking  blood sugar before meals and bedtime and bring log next visit -All medications with mechanism of action and side effects -Hypoglycemia management: rule of 15's, Glucagon  Emergency Kit and medical alert ID -low-carb low-fat plate-method diet -At least 20 minutes of physical activity per day -Annual dilated retinal eye exam and foot exam -compliance and follow up needs -follow up as scheduled or earlier if problem gets worse  Call if blood sugar is less than 70 or consistently above 250    Take a 15 gm snack of carbohydrate at bedtime before you go to sleep if your blood sugar is less than 100.    If you are going to fast after midnight for a test or procedure, ask your physician for instructions on how to reduce/decrease your insulin  dose.    Call if blood sugar is less than 70 or consistently above 250  -Treating a low sugar by rule of 15  (15 gms of sugar every 15 min until sugar is more than 70) If you feel your sugar is low, test your sugar to be sure If your sugar is low (less than 70), then take 15 grams of a fast acting Carbohydrate (3-4 glucose tablets or glucose gel or 4 ounces of juice or regular soda) Recheck your sugar 15 min after treating low to make sure it is more than 70 If sugar is still less than 70, treat again with 15 grams of carbohydrate          Don't drive the hour of hypoglycemia  If unconscious/unable to eat or drink by mouth, use glucagon  injection or nasal spray baqsimi  and call 911. Can repeat again in 15 min if still unconscious.  Return in about 6 weeks (around 03/13/2024) for tele-visit: 3:20 pm.   I have reviewed current medications, nurse's notes, allergies, vital signs, past medical and surgical history, family medical history, and social history for this encounter. Counseled patient on symptoms, examination findings, lab findings, imaging results, treatment decisions and monitoring and prognosis. The patient understood the recommendations and agrees with  the treatment plan. All questions regarding treatment plan were fully answered.  Lindsey Birmingham, MD  01/31/24   History of Present Illness Lindsey Bowen is a 53 y.o. year old female who presents for follow up of Type II diabetes mellitus.  Aliannah Sibrian was first diagnosed in 2012.   Diabetes education +  Home diabetes regimen: U500 insulin  150 units qam before break fast, 90 units before lunch, and 30 units before dinner units before supper-15 min before meals (takes BF around 6pm, lunch and midnight and dinenr around 3 am) Trulicity  4.5 mg weekly   Previously was on: Toujeo  80 units at bedtime  Humalog  25 units 2-3 times a day, 15 min before meals   COMPLICATIONS +  MI/Stroke/TIA -  retinopathy -  neuropathy +  nephropathy  BLOOD SUGAR DATA CGM interpretation: At today's visit, we reviewed her CGM downloads. The full report is scanned in the media. Reviewing the CGM trends, BG are elevated at night/early morning, and low-high in the daytime.  Physical Exam  Ht 5' 5 (1.651 m)   Wt 250 lb (113.4 kg)   BMI 41.60 kg/m    Constitutional: well developed, well nourished Head: normocephalic, atraumatic Eyes: sclera anicteric, no redness Neck: supple Lungs: normal respiratory effort Neurology: alert and oriented Skin: dry, no appreciable  rashes Musculoskeletal: no appreciable defects Psychiatric: normal mood and affect Diabetic Foot Exam - Simple   No data filed      Current Medications Patient's Medications  New Prescriptions   No medications on file  Previous Medications   BLOOD PRESSURE MONITOR DEVI    Please provide patient with insurance approved blood pressure monitor ICD I 10.0   CHLORTHALIDONE  (HYGROTON ) 25 MG TABLET    Take 0.5 tablets (12.5 mg total) by mouth daily.   CONTINUOUS GLUCOSE SENSOR (DEXCOM G7 SENSOR) MISC    APPLY 1 SENSOR EVERY 10 DAYS   DULAGLUTIDE  (TRULICITY ) 4.5 MG/0.5ML SOAJ    Inject 4.5 mg as directed once a week.   EPINEPHRINE  0.3  MG/0.3 ML IJ SOAJ INJECTION    Inject 0.3 mg into the muscle as needed for anaphylaxis.   FLUTICASONE  (FLONASE ) 50 MCG/ACT NASAL SPRAY    Place 2 sprays into both nostrils daily as needed for allergies or rhinitis.   GLUCAGON  (BAQSIMI  ONE PACK) 3 MG/DOSE POWD    Place 1 Device into the nose as needed (Low blood sugar with impaired consciousness).   INSULIN  PEN NEEDLE (EASY COMFORT PEN NEEDLES) 31G X 5 MM MISC    USE AS INSTRUCTED. CHECK BLOOD GLUCOSE LEVEL BY FINGERSTICK 5 TO 6 TIMES PER DAY.   INSULIN  REGULAR HUMAN CONCENTRATED (HUMULIN  R U-500 KWIKPEN) 500 UNIT/ML KWIKPEN    Inject 105 units every morning before breakfast, 70 units before lunch, and 30 units before supper-30 min before meals   LEVOCETIRIZINE (XYZAL) 5 MG TABLET    Take 5 mg by mouth daily as needed.   LEVONORGESTREL  (LILETTA , 52 MG,) 20.1 MCG/DAY IUD    1 each by Intrauterine route once.   LEVOTHYROXINE  (SYNTHROID ) 150 MCG TABLET    Take 1 tablet (150 mcg total) by mouth daily.   LINACLOTIDE  (LINZESS ) 72 MCG CAPSULE    Take 1 capsule (72 mcg total) by mouth daily before breakfast.   ROSUVASTATIN  (CRESTOR ) 20 MG TABLET    Take 1 tablet (20 mg total) by mouth at bedtime.   SENNOSIDES-DOCUSATE SODIUM  (SENOKOT-S) 8.6-50 MG TABLET    Take 2 tablets by mouth daily.   VALSARTAN  (DIOVAN ) 320 MG TABLET    TAKE 1 TABLET (320 MG TOTAL) BY MOUTH DAILY.   VENTOLIN  HFA 108 (90 BASE) MCG/ACT INHALER    INHALE 1 TO 2 PUFFS INTO THE LUNGS EVERY 6 (SIX) HOURS AS NEEDED FOR WHEEZING OR SHORTNESS OF BREATH.   VITAMIN D , ERGOCALCIFEROL , (DRISDOL ) 1.25 MG (50000 UNIT) CAPS CAPSULE    TAKE 1 CAPSULE (50,000 UNITS TOTAL) BY MOUTH ONCE A WEEK.  Modified Medications   No medications on file  Discontinued Medications   No medications on file    Allergies Allergies  Allergen Reactions   Dust Mite Extract Hives and Itching   Shellfish Allergy Anaphylaxis    Past Medical History Past Medical History:  Diagnosis Date   Anemia    Anxiety    Asthma     Bilateral edema of lower extremity    Chronic pain of left knee    Constipation    Depression    Diabetes mellitus type 2 with neurological manifestations (HCC) 05/27/2017   Diabetes mellitus without complication (HCC)    Gallstones    Hypothyroidism    Joint pain    Lumbar pain    Migraine    Onychodystrophy    Onychomycosis    Other fatigue    Paresthesia 05/17/2017   Shortness of breath on exertion  Sickle cell trait (HCC)    Sleep apnea    TIA (transient ischemic attack) 05/26/2017   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    Past Surgical History Past Surgical History:  Procedure Laterality Date   CHOLECYSTECTOMY     DILATION AND CURETTAGE OF UTERUS     HERNIA REPAIR      Family History family history includes Addison's disease in her mother; Asthma in her mother; Colon polyps in her mother; Diabetes in her maternal grandfather, mother, paternal grandmother, paternal uncle, and son; Glaucoma in her father and sister; Heart attack in her maternal aunt, maternal grandmother, and maternal uncle; Hypertension in her mother; Irritable bowel syndrome in her daughter and mother; Mental retardation in her sister; Stomach cancer in her maternal grandfather; Stroke in her maternal grandmother; Ulcerative colitis in her maternal grandfather.  Social History Social History   Socioeconomic History   Marital status: Single    Spouse name: Not on file   Number of children: 3   Years of education: Not on file   Highest education level: Some college, no degree  Occupational History   Occupation: Counselling psychologist: [employer]    Comment: Works nights  Tobacco Use   Smoking status: Never   Smokeless tobacco: Never  Vaping Use   Vaping status: Never Used  Substance and Sexual Activity   Alcohol use: No   Drug use: Not Currently   Sexual activity: Yes    Partners: Male    Birth control/protection: I.U.D.    Comment: Liletta    Other Topics Concern   Not on file  Social History Narrative   Lives    Caffeine use:    Social Drivers of Health   Financial Resource Strain: Medium Risk (09/06/2023)   Overall Financial Resource Strain (CARDIA)    Difficulty of Paying Living Expenses: Somewhat hard  Food Insecurity: Food Insecurity Present (09/06/2023)   Hunger Vital Sign    Worried About Running Out of Food in the Last Year: Sometimes true    Ran Out of Food in the Last Year: Sometimes true  Transportation Needs: No Transportation Needs (09/06/2023)   PRAPARE - Administrator, Civil Service (Medical): No    Lack of Transportation (Non-Medical): No  Physical Activity: Insufficiently Active (08/29/2023)   Exercise Vital Sign    Days of Exercise per Week: 1 day    Minutes of Exercise per Session: 30 min  Stress: No Stress Concern Present (08/29/2023)   [employer] of Occupational Health - Occupational Stress Questionnaire    Feeling of Stress : Not at all  Social Connections: Moderately Isolated (08/29/2023)   Social Connection and Isolation Panel    Frequency of Communication with Friends and Family: More than three times a week    Frequency of Social Gatherings with Friends and Family: Never    Attends Religious Services: Never    Database administrator or Organizations: No    Attends Engineer, structural: 1 to 4 times per year    Marital Status: Never married  Intimate Partner Violence: Not At Risk (08/30/2022)   Received from [facility]   HITS    Over the last 12 months how often did your partner physically hurt you?: Never    Over the last 12 months how often did your partner insult you or talk down to you?: Never    Over the last 12 months how often did your partner threaten you with  physical harm?: Never    Over the last 12 months how often did your partner scream or curse at you?: Never    Lab Results  Component Value Date   HGBA1C 9.1 (A) 10/31/2023   HGBA1C 9.9 (A)  07/22/2023   HGBA1C 12.3 (A) 05/02/2023   Lab Results  Component Value Date   CHOL 124 11/11/2023   Lab Results  Component Value Date   HDL 41 11/11/2023   Lab Results  Component Value Date   LDLCALC 67 11/11/2023   Lab Results  Component Value Date   TRIG 79 11/11/2023   Lab Results  Component Value Date   CHOLHDL 3.0 11/11/2023   Lab Results  Component Value Date   CREATININE 1.05 (H) 11/11/2023   No results found for: GFR Lab Results  Component Value Date   MICROALBUR 11.0 07/22/2023      Component Value Date/Time   NA 140 11/11/2023 1643   K 3.8 11/11/2023 1643   CL 100 11/11/2023 1643   CO2 26 11/11/2023 1643   GLUCOSE 220 (H) 11/11/2023 1643   GLUCOSE 228 (H) 07/22/2023 1123   BUN 12 11/11/2023 1643   CREATININE 1.05 (H) 11/11/2023 1643   CREATININE 1.08 (H) 07/22/2023 1123   CALCIUM  9.2 11/11/2023 1643   PROT 6.2 11/11/2023 1643   ALBUMIN 4.1 11/11/2023 1643   AST 19 11/11/2023 1643   ALT 27 11/11/2023 1643   ALKPHOS 56 11/11/2023 1643   BILITOT 0.3 11/11/2023 1643   GFRNONAA 61 03/28/2020 1702   GFRAA 71 03/28/2020 1702      Latest Ref Rng & Units 11/11/2023    4:43 PM 07/22/2023   11:23 AM 05/25/2023   10:03 AM  BMP  Glucose 70 - 99 mg/dL 779  771  719   BUN 6 - 24 mg/dL 12  14  20    Creatinine 0.57 - 1.00 mg/dL 8.94  8.91  8.71   BUN/Creat Ratio 9 - 23 11  13  16    Sodium 134 - 144 mmol/L 140  140  140   Potassium 3.5 - 5.2 mmol/L 3.8  3.7  3.4   Chloride 96 - 106 mmol/L 100  101  98   CO2 20 - 29 mmol/L 26  30  29    Calcium  8.7 - 10.2 mg/dL 9.2  9.3  9.5        Component Value Date/Time   WBC 7.0 10/12/2022 1127   WBC 7.4 01/29/2021 0953   RBC 5.26 10/12/2022 1127   RBC 5.08 01/29/2021 0953   HGB 12.2 10/12/2022 1127   HCT 39.3 10/12/2022 1127   PLT 274 10/12/2022 1127   MCV 75 (L) 10/12/2022 1127   MCH 23.2 (L) 10/12/2022 1127   MCH 22.8 (L) 05/27/2017 0426   MCHC 31.0 (L) 10/12/2022 1127   MCHC 31.8 01/29/2021 0953   RDW  14.2 10/12/2022 1127   LYMPHSABS 2.8 10/12/2022 1127   MONOABS 0.4 01/29/2021 0953   EOSABS 0.2 10/12/2022 1127   BASOSABS 0.0 10/12/2022 1127     Parts of this note may have been dictated using voice recognition software. There may be variances in spelling and vocabulary which are unintentional. Not all errors are proofread. Please notify the dino if any discrepancies are noted or if the meaning of any statement is not clear.

## 2024-02-04 ENCOUNTER — Ambulatory Visit: Payer: Self-pay | Admitting: Nurse Practitioner

## 2024-02-07 ENCOUNTER — Other Ambulatory Visit: Payer: Self-pay

## 2024-02-13 ENCOUNTER — Ambulatory Visit: Attending: Nurse Practitioner | Admitting: Nurse Practitioner

## 2024-02-13 ENCOUNTER — Other Ambulatory Visit: Payer: Self-pay

## 2024-02-13 ENCOUNTER — Encounter: Payer: Self-pay | Admitting: Nurse Practitioner

## 2024-02-13 DIAGNOSIS — M9911 Subluxation complex (vertebral) of cervical region: Secondary | ICD-10-CM | POA: Diagnosis not present

## 2024-02-13 DIAGNOSIS — I10 Essential (primary) hypertension: Secondary | ICD-10-CM

## 2024-02-13 DIAGNOSIS — E559 Vitamin D deficiency, unspecified: Secondary | ICD-10-CM

## 2024-02-13 DIAGNOSIS — M9915 Subluxation complex (vertebral) of pelvic region: Secondary | ICD-10-CM | POA: Diagnosis not present

## 2024-02-13 DIAGNOSIS — M9914 Subluxation complex (vertebral) of sacral region: Secondary | ICD-10-CM | POA: Diagnosis not present

## 2024-02-13 DIAGNOSIS — E785 Hyperlipidemia, unspecified: Secondary | ICD-10-CM | POA: Diagnosis not present

## 2024-02-13 DIAGNOSIS — M9913 Subluxation complex (vertebral) of lumbar region: Secondary | ICD-10-CM | POA: Diagnosis not present

## 2024-02-13 MED ORDER — ROSUVASTATIN CALCIUM 20 MG PO TABS
20.0000 mg | ORAL_TABLET | Freq: Every day | ORAL | 1 refills | Status: AC
  Filled 2024-02-13 – 2024-03-13 (×2): qty 90, 90d supply, fill #0
  Filled 2024-06-14: qty 90, 90d supply, fill #1

## 2024-02-13 MED ORDER — BLOOD PRESSURE MONITOR DEVI: 0 refills | Status: AC

## 2024-02-13 MED ORDER — VITAMIN D (ERGOCALCIFEROL) 1.25 MG (50000 UNIT) PO CAPS
50000.0000 [IU] | ORAL_CAPSULE | ORAL | 0 refills | Status: AC
  Filled 2024-02-13: qty 12, 84d supply, fill #0

## 2024-02-13 MED ORDER — CHLORTHALIDONE 25 MG PO TABS
12.5000 mg | ORAL_TABLET | Freq: Every day | ORAL | 1 refills | Status: AC
  Filled 2024-02-13 – 2024-04-16 (×5): qty 45, 90d supply, fill #0
  Filled 2024-07-31: qty 45, 90d supply, fill #1

## 2024-02-13 NOTE — Progress Notes (Signed)
 Assessment & Plan:  Lindsey Bowen was seen today for hypertension.  Diagnoses and all orders for this visit:  Primary hypertension -     Blood Pressure Monitor DEVI; Please provide patient with insurance approved blood pressure monitor ICD I 10.0 -     chlorthalidone  (HYGROTON ) 25 MG tablet; Take 0.5 tablets (12.5 mg total) by mouth daily.  Hyperlipidemia LDL goal <70 -     rosuvastatin  (CRESTOR ) 20 MG tablet; Take 1 tablet (20 mg total) by mouth at bedtime.  Vitamin D  deficiency disease -     Vitamin D , Ergocalciferol , (DRISDOL ) 1.25 MG (50000 UNIT) CAPS capsule; Take 1 capsule (50,000 Units total) by mouth once a week.    Patient has been counseled on age-appropriate routine health concerns for screening and prevention. These are reviewed and up-to-date. Referrals have been placed accordingly. Immunizations are up-to-date or declined.    Subjective:   Chief Complaint  Patient presents with   Hypertension    Lindsey Bowen 52 y.o. female presents to office today for follow up to HTN  She has a history of HTN, DM, Asthma, OSA and thyroid  disorder She has a history of sleep apnea but she reports was diagnosed during COVID and due to to the requirement of having numerous COVID test she was lost to follow-up for the CPAP titration study.      HTN Blood pressure is well controlled today. Currently taking chlorthalidone  25 mg daily and valsartan  320 mg daily.   BP Readings from Last 3 Encounters:  02/13/24 125/80  01/12/24 121/78  11/11/23 117/74     Review of Systems  Constitutional:  Negative for fever, malaise/fatigue and weight loss.  HENT: Negative.  Negative for nosebleeds.   Eyes: Negative.  Negative for blurred vision, double vision and photophobia.  Respiratory: Negative.  Negative for cough and shortness of breath.   Cardiovascular: Negative.  Negative for chest pain, palpitations and leg swelling.  Gastrointestinal: Negative.  Negative for heartburn,  nausea and vomiting.  Musculoskeletal: Negative.  Negative for myalgias.  Neurological: Negative.  Negative for dizziness, focal weakness, seizures and headaches.  Psychiatric/Behavioral: Negative.  Negative for suicidal ideas.     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    Past Surgical History:  Procedure Laterality Date   CHOLECYSTECTOMY     DILATION AND CURETTAGE OF UTERUS     HERNIA REPAIR      Family History  Problem Relation Age of Onset   Diabetes Mother    Hypertension Mother    Addison's disease Mother    Asthma Mother    Colon polyps Mother    Irritable bowel syndrome Mother    Glaucoma Father    Mental retardation Sister    Glaucoma  Sister    Heart attack Maternal Aunt    Heart attack Maternal Uncle    Diabetes Paternal Uncle    Heart attack Maternal Grandmother    Stroke Maternal Grandmother    Ulcerative colitis Maternal Grandfather    Diabetes Maternal Grandfather    Stomach cancer Maternal Grandfather    Diabetes Paternal Grandmother    Irritable bowel syndrome Daughter    Diabetes Son    Colon cancer Neg Hx    Esophageal cancer Neg Hx    Rectal cancer Neg Hx     Social History Reviewed with no changes to be made today.   Outpatient Medications Prior to Visit  Medication Sig Dispense Refill   Continuous Glucose Sensor (DEXCOM G7 SENSOR) MISC APPLY 1  SENSOR EVERY 10 DAYS 9 each 1   Dulaglutide  (TRULICITY ) 4.5 MG/0.5ML SOAJ Inject 4.5 mg as directed once a week. 6 mL 1   EPINEPHrine  0.3 mg/0.3 mL IJ SOAJ injection Inject 0.3 mg into the muscle as needed for anaphylaxis. 2 each 0   fluticasone  (FLONASE ) 50 MCG/ACT nasal spray Place 2 sprays into both nostrils daily as needed for allergies or rhinitis.     Glucagon  (BAQSIMI  ONE PACK) 3 MG/DOSE POWD Place 1 Device into the nose as needed (Low blood sugar with impaired consciousness). 2 each 3   Insulin  Pen Needle (EASY COMFORT PEN NEEDLES) 31G X 5 MM MISC USE AS INSTRUCTED. CHECK BLOOD GLUCOSE LEVEL BY FINGERSTICK 5 TO 6 TIMES PER DAY. 200 each 6   insulin  regular human CONCENTRATED (HUMULIN  R U-500 KWIKPEN) 500 UNIT/ML KwikPen Inject 105 units every morning before breakfast, 70 units before lunch, and 30 units before supper-30 min before meals (Patient taking differently: Inject 130 units every morning before breakfast, 80-90 units before lunch, and 30 units before supper-30 min before meals) 45 mL 3   levocetirizine (XYZAL) 5 MG tablet Take 5 mg by mouth daily as needed.     levonorgestrel  (LILETTA , 52 MG,) 20.1 MCG/DAY IUD 1 each by Intrauterine route once.     levothyroxine  (SYNTHROID ) 150 MCG tablet Take 1 tablet (150 mcg total) by mouth daily. 90 tablet 1   linaclotide  (LINZESS ) 72 MCG capsule Take 1 capsule (72 mcg total) by mouth daily before breakfast. 90 capsule 1   sennosides-docusate sodium  (SENOKOT-S) 8.6-50 MG tablet Take 2 tablets by mouth daily. 180 tablet 1   valsartan  (DIOVAN ) 320 MG tablet TAKE 1 TABLET (320 MG TOTAL) BY MOUTH DAILY. 90 tablet 3   VENTOLIN  HFA 108 (90 Base) MCG/ACT inhaler INHALE 1 TO 2 PUFFS INTO THE LUNGS EVERY 6 (SIX) HOURS AS NEEDED FOR WHEEZING OR SHORTNESS OF BREATH. 54 g 1   Blood Pressure Monitor DEVI Please provide patient with insurance approved blood pressure monitor ICD I 10.0 1 each 0   chlorthalidone  (HYGROTON ) 25 MG tablet Take 0.5 tablets (12.5 mg  total) by mouth daily. 45 tablet 1   rosuvastatin  (CRESTOR ) 20 MG tablet Take 1 tablet (20 mg total) by mouth at bedtime. 90 tablet 1   Vitamin D , Ergocalciferol , (DRISDOL ) 1.25 MG (50000 UNIT) CAPS capsule TAKE 1 CAPSULE (50,000 UNITS TOTAL) BY MOUTH ONCE A WEEK. (Patient not taking: Reported on 02/13/2024) 12 capsule 0   No facility-administered medications prior to visit.    Allergies  Allergen Reactions   Dust Mite Extract Hives and Itching   Shellfish Allergy Anaphylaxis       Objective:    BP 125/80 (BP Location: Left Arm, Patient Position: Sitting, Cuff Size:  Normal)   Pulse 92   Resp 20   Ht 5' 5 (1.651 m)   Wt 258 lb 12.8 oz (117.4 kg)   SpO2 99%   BMI 43.07 kg/m  Wt Readings from Last 3 Encounters:  02/13/24 258 lb 12.8 oz (117.4 kg)  01/31/24 250 lb (113.4 kg)  01/12/24 255 lb 8 oz (115.9 kg)    Physical Exam Vitals and nursing note reviewed.  Constitutional:      Appearance: She is well-developed.  HENT:     Head: Normocephalic and atraumatic.  Cardiovascular:     Rate and Rhythm: Normal rate and regular rhythm.     Heart sounds: Normal heart sounds. No murmur heard.    No friction rub. No gallop.  Pulmonary:     Effort: Pulmonary effort is normal. No tachypnea or respiratory distress.     Breath sounds: Normal breath sounds. No decreased breath sounds, wheezing, rhonchi or rales.  Chest:     Chest wall: No tenderness.  Abdominal:     General: Bowel sounds are normal.     Palpations: Abdomen is soft.  Musculoskeletal:        General: Normal range of motion.     Cervical back: Normal range of motion.  Skin:    General: Skin is warm and dry.  Neurological:     Mental Status: She is alert and oriented to person, place, and time.     Coordination: Coordination normal.  Psychiatric:        Behavior: Behavior normal. Behavior is cooperative.        Thought Content: Thought content normal.        Judgment: Judgment normal.          Patient has  been counseled extensively about nutrition and exercise as well as the importance of adherence with medications and regular follow-up. The patient was given clear instructions to go to ER or return to medical center if symptoms don't improve, worsen or new problems develop. The patient verbalized understanding.   Follow-up: Return in about 6 months (around 08/15/2024).   Haze LELON Servant, FNP-BC [facility] [place], [place] [phone]   03/04/2024, 10:22 PM

## 2024-02-15 ENCOUNTER — Other Ambulatory Visit: Payer: Self-pay

## 2024-02-16 ENCOUNTER — Other Ambulatory Visit: Payer: Self-pay

## 2024-02-24 ENCOUNTER — Other Ambulatory Visit: Payer: Self-pay

## 2024-02-24 DIAGNOSIS — M7918 Myalgia, other site: Secondary | ICD-10-CM | POA: Diagnosis not present

## 2024-02-24 DIAGNOSIS — M542 Cervicalgia: Secondary | ICD-10-CM | POA: Diagnosis not present

## 2024-02-24 MED ORDER — MELOXICAM 7.5 MG PO TABS
7.5000 mg | ORAL_TABLET | Freq: Two times a day (BID) | ORAL | 1 refills | Status: DC
  Filled 2024-02-24: qty 42, 21d supply, fill #0
  Filled 2024-03-28: qty 42, 21d supply, fill #1

## 2024-02-27 MED FILL — Insulin Pen Needle 31 G X 5 MM (1/5" or 3/16"): 33 days supply | Qty: 200 | Fill #1 | Status: AC

## 2024-02-27 MED FILL — Continuous Glucose System Sensor: 30 days supply | Qty: 3 | Fill #1 | Status: AC

## 2024-02-28 ENCOUNTER — Other Ambulatory Visit: Payer: Self-pay

## 2024-02-29 ENCOUNTER — Other Ambulatory Visit: Payer: Self-pay

## 2024-03-06 DIAGNOSIS — M7918 Myalgia, other site: Secondary | ICD-10-CM | POA: Diagnosis not present

## 2024-03-06 DIAGNOSIS — M545 Low back pain, unspecified: Secondary | ICD-10-CM | POA: Diagnosis not present

## 2024-03-06 DIAGNOSIS — M47896 Other spondylosis, lumbar region: Secondary | ICD-10-CM | POA: Diagnosis not present

## 2024-03-06 DIAGNOSIS — M47816 Spondylosis without myelopathy or radiculopathy, lumbar region: Secondary | ICD-10-CM | POA: Diagnosis not present

## 2024-03-13 ENCOUNTER — Other Ambulatory Visit: Payer: Self-pay

## 2024-03-16 ENCOUNTER — Other Ambulatory Visit: Payer: Self-pay | Admitting: Pharmacist

## 2024-03-16 ENCOUNTER — Other Ambulatory Visit: Payer: Self-pay

## 2024-03-16 DIAGNOSIS — I10 Essential (primary) hypertension: Secondary | ICD-10-CM

## 2024-03-16 MED ORDER — VALSARTAN 320 MG PO TABS
320.0000 mg | ORAL_TABLET | Freq: Every day | ORAL | 3 refills | Status: AC
  Filled 2024-03-16: qty 90, 90d supply, fill #0
  Filled 2024-06-26: qty 90, 90d supply, fill #1

## 2024-03-20 ENCOUNTER — Ambulatory Visit: Admitting: "Endocrinology

## 2024-03-22 ENCOUNTER — Other Ambulatory Visit: Payer: Self-pay

## 2024-03-28 ENCOUNTER — Other Ambulatory Visit: Payer: Self-pay

## 2024-03-28 MED FILL — Insulin Pen Needle 31 G X 5 MM (1/5" or 3/16"): 33 days supply | Qty: 200 | Fill #2 | Status: AC

## 2024-03-28 MED FILL — Continuous Glucose System Sensor: 30 days supply | Qty: 3 | Fill #2 | Status: AC

## 2024-04-02 ENCOUNTER — Other Ambulatory Visit: Payer: Self-pay

## 2024-04-06 ENCOUNTER — Other Ambulatory Visit: Payer: Self-pay

## 2024-04-11 ENCOUNTER — Other Ambulatory Visit: Payer: Self-pay | Admitting: "Endocrinology

## 2024-04-12 ENCOUNTER — Other Ambulatory Visit: Payer: Self-pay

## 2024-04-12 MED ORDER — LEVOTHYROXINE SODIUM 150 MCG PO TABS
150.0000 ug | ORAL_TABLET | Freq: Every day | ORAL | 1 refills | Status: AC
  Filled 2024-04-12: qty 90, 90d supply, fill #0
  Filled 2024-05-30 – 2024-06-06 (×3): qty 90, 90d supply, fill #1

## 2024-04-16 ENCOUNTER — Other Ambulatory Visit: Payer: Self-pay

## 2024-04-20 ENCOUNTER — Other Ambulatory Visit: Payer: Self-pay

## 2024-04-25 ENCOUNTER — Encounter: Payer: Self-pay | Admitting: "Endocrinology

## 2024-05-04 MED FILL — Insulin Pen Needle 31 G X 5 MM (1/5" or 3/16"): 33 days supply | Qty: 200 | Fill #3 | Status: AC

## 2024-05-04 MED FILL — Continuous Glucose System Sensor: 30 days supply | Qty: 3 | Fill #3 | Status: AC

## 2024-05-08 ENCOUNTER — Other Ambulatory Visit: Payer: Self-pay

## 2024-05-15 ENCOUNTER — Ambulatory Visit: Admitting: "Endocrinology

## 2024-05-15 LAB — OPHTHALMOLOGY REPORT-SCANNED

## 2024-05-17 ENCOUNTER — Other Ambulatory Visit: Payer: Self-pay

## 2024-05-17 ENCOUNTER — Encounter: Payer: Self-pay | Admitting: "Endocrinology

## 2024-05-17 ENCOUNTER — Ambulatory Visit: Admitting: "Endocrinology

## 2024-05-17 VITALS — BP 130/70 | HR 64 | Ht 65.0 in | Wt 261.0 lb

## 2024-05-17 DIAGNOSIS — Z794 Long term (current) use of insulin: Secondary | ICD-10-CM

## 2024-05-17 DIAGNOSIS — Z7984 Long term (current) use of oral hypoglycemic drugs: Secondary | ICD-10-CM

## 2024-05-17 DIAGNOSIS — E1165 Type 2 diabetes mellitus with hyperglycemia: Secondary | ICD-10-CM

## 2024-05-17 LAB — POCT GLYCOSYLATED HEMOGLOBIN (HGB A1C): Hemoglobin A1C: 9.1 % — AB (ref 4.0–5.6)

## 2024-05-17 MED ORDER — METFORMIN HCL ER 500 MG PO TB24
1000.0000 mg | ORAL_TABLET | Freq: Two times a day (BID) | ORAL | 3 refills | Status: AC
  Filled 2024-05-17: qty 120, 30d supply, fill #0
  Filled 2024-06-26: qty 120, 30d supply, fill #1

## 2024-05-17 NOTE — Progress Notes (Signed)
 Outpatient Endocrinology Note Lindsey Birmingham, MD  05/17/24   Lindsey Bowen Jan 08, 1971 [id number]  Referring Provider: Theotis Bowen ORN, NP Primary Care Provider: Theotis Bowen ORN, NP Reason for consultation: Subjective   Assessment & Plan  Diagnoses and all orders for this visit:  Uncontrolled type 2 diabetes mellitus with hyperglycemia (HCC) -     POCT glycosylated hemoglobin (Hb A1C) -     Ambulatory referral to diabetic education  Other orders -     metFORMIN (GLUCOPHAGE-XR) 500 MG 24 hr tablet; Take 2 tablets (1,000 mg total) by mouth 2 (two) times daily with a meal.   Hypothyroidism, on levothyroxine  175 mcg po every day, TSH was 0.2 On levothyroxine  to 150 mcg po every day, TSH is WNL Continue to take levothyroxine  first thing in the morning on empty stomach and wait at least 30 minutes to 1 hour before eating or drinking anything or taking any other medications. Space out levothyroxine  by 4 hours from any acid reflux medication, fibrate, iron, calcium , multivitamin, birth control pills and nutritional supplements.   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 We will start you on an extended release version of metformin, which should help with the upset stomach. Start with one 500 mg capsule taken every evening with dinner. Stay on this for 3-5 days, then increase to 1 tablet with breakfast and one with dinner. If you do not have any upset stomach, gradually increase to the goal dose of 2 capsules with breakfast and 2 capsules with dinner. If you do have upset stomach, decrease it back to the  previous dose that you tolerated.  Week 1: one tablet after dinner Week 2: one tablet after breakfast and one after dinner  Week 3: one tablet after breakfast and two after dinner  Week 4 and onwards: two tablets after breakfast and two after dinner  (takes BF around 6pm, lunch at midnight and dinner around 3 am) Previously discussed, Missing doses when missing meals Instructed to cut down sugary drinks 05/17/24: Ordered DM education tor resume insulin  pump, been on omnipod before Counseled pt extensively on lifestyle changes, eating 3 equi-sized meals, insulin  management, risks involved with high doses insulin  and advised against soda DM educator will f/u on pt to make sure she has no lows in a few days    Ordered and explained baqsimi /glucagon  use and sent on 06/10/23 Jardiance gave UTI Couldn't tolerate metformin-tried different versions/doses for a year   Sees podiatry Dr Lindsey Bowen   No known contraindications/side effects to any of above medications No history of MEN syndrome/medullary thyroid  cancer/pancreatitis or pancreatic cancer in self or family  -Last LD and Tg are as follows: Lab Results  Component Value Date   LDLCALC 67 11/11/2023    Lab Results  Component Value Date   TRIG 79 11/11/2023   -On rosuvastatin  20 mg QD -Follow low fat diet and exercise   -Blood pressure goal <140/90 - Microalbumin/creatinine goal is < 30 -Last MA/Cr is as follows: Lab Results  Component Value Date   MICROALBUR 11.0 07/22/2023   -on ACE/ARB valsartan  320 mg  qd -diet changes including salt restriction -limit eating outside -counseled BP targets per standards of diabetes care -uncontrolled blood pressure can lead to retinopathy, nephropathy and cardiovascular and atherosclerotic heart disease  Reviewed and counseled on: -A1C target -Blood sugar targets -Complications of uncontrolled diabetes  -Checking blood sugar before meals and bedtime and bring log next  visit -All medications with mechanism of action and side effects -Hypoglycemia management: rule of 15's, Glucagon  Emergency Kit and medical alert ID -low-carb low-fat plate-method diet -At least 20 minutes of physical activity per day -Annual dilated retinal eye exam and foot exam -compliance and follow up needs -follow up as scheduled or earlier if problem gets worse  Call if blood sugar is less than 70 or consistently above 250    Take a 15 gm snack of carbohydrate at bedtime before you go to sleep if your blood sugar is less than 100.    If you are going to fast after midnight for a test or procedure, ask your physician for instructions on how to reduce/decrease your insulin  dose.    Call if blood sugar is less than 70 or consistently above 250  -Treating a low sugar by rule of 15  (15 gms of sugar every 15 min until sugar is more than 70) If you feel your sugar is low, test your sugar to be sure If your sugar is low (less than 70), then take 15 grams of a fast acting Carbohydrate (3-4 glucose tablets or glucose gel or 4 ounces of juice or regular soda) Recheck your sugar 15 min after treating low to make sure it is more than 70 If sugar is still less than 70, treat again with 15 grams of carbohydrate          Don't drive the hour of hypoglycemia  If unconscious/unable to eat or drink by mouth, use glucagon  injection or nasal spray baqsimi  and call 911. Can repeat again in 15 min if still unconscious.  Return in about 4 weeks (around 06/14/2024).   I have reviewed current medications, nurse's notes, allergies, vital signs, past medical and surgical history, family medical history, and social history for this encounter. Counseled patient on symptoms, examination findings, lab findings, imaging results, treatment decisions and monitoring and prognosis. The patient understood the recommendations and agrees with the treatment plan. All questions regarding treatment plan were fully  answered.  Lindsey Birmingham, MD  05/17/24   History of Present Illness Lindsey Bowen is a 53 y.o. year old female who presents for follow up of Type II diabetes mellitus.  Lindsey Bowen was first diagnosed in 2012.   Diabetes education +  Home diabetes regimen: U500 insulin  190 units qam around 5:30pm-10pm, 90 units around 1am-5am -30 min before meals (takes BF around 6pm, lunch and midnight and dinner around 3 am) Trulicity  4.5 mg weekly   Previously was on: Toujeo  80 units at bedtime  Humalog  25 units 2-3 times a day, 15 min before meals   COMPLICATIONS +  MI/Stroke/TIA -  retinopathy -  neuropathy +  nephropathy  BLOOD SUGAR DATA CGM interpretation: At today's visit, we reviewed her CGM downloads. The full report is scanned in the media. Reviewing the CGM trends, BG are elevated starting 12 pm on average, peaking between 6pm-10pm and dropping between 3am-12am.   Physical Exam  BP 130/70   Pulse 64   Ht 5' 5 (1.651 m)   Wt 261 lb (118.4 kg)   SpO2 98%   BMI 43.43 kg/m  Constitutional: well developed, well nourished Head: normocephalic, atraumatic Eyes: sclera anicteric, no redness Neck: supple Lungs: normal respiratory effort Neurology: alert and oriented Skin: dry, no appreciable rashes Musculoskeletal: no appreciable defects Psychiatric: normal mood and affect Diabetic Foot Exam - Simple   No data filed      Current Medications Patient's Medications  New Prescriptions   METFORMIN (GLUCOPHAGE-XR) 500 MG 24 HR TABLET    Take 2 tablets (1,000 mg total) by mouth 2 (two) times daily with a meal.  Previous Medications   BLOOD PRESSURE MONITOR DEVI    Please provide patient with insurance approved blood pressure monitor ICD I 10.0   CHLORTHALIDONE  (HYGROTON ) 25 MG TABLET    Take 0.5 tablets (12.5 mg total) by mouth daily.   CONTINUOUS GLUCOSE SENSOR (DEXCOM G7 SENSOR) MISC    APPLY 1 SENSOR EVERY 10 DAYS   DULAGLUTIDE  (TRULICITY ) 4.5 MG/0.5ML SOAJ     Inject 4.5 mg as directed once a week.   EPINEPHRINE  0.3 MG/0.3 ML IJ SOAJ INJECTION    Inject 0.3 mg into the muscle as needed for anaphylaxis.   FLUTICASONE  (FLONASE ) 50 MCG/ACT NASAL SPRAY    Place 2 sprays into both nostrils daily as needed for allergies or rhinitis.   GLUCAGON  (BAQSIMI  ONE PACK) 3 MG/DOSE POWD    Place 1 Device into the nose as needed (Low blood sugar with impaired consciousness).   INSULIN  PEN NEEDLE (EASY COMFORT PEN NEEDLES) 31G X 5 MM MISC    USE AS INSTRUCTED. CHECK BLOOD GLUCOSE LEVEL BY FINGERSTICK 5 TO 6 TIMES PER DAY.   INSULIN  REGULAR HUMAN CONCENTRATED (HUMULIN  R U-500 KWIKPEN) 500 UNIT/ML KWIKPEN    Inject 105 units every morning before breakfast, 70 units before lunch, and 30 units before supper-30 min before meals   LEVOCETIRIZINE (XYZAL) 5 MG TABLET    Take 5 mg by mouth daily as needed.   LEVONORGESTREL  (LILETTA , 52 MG,) 20.1 MCG/DAY IUD    1 each by Intrauterine route once.   LEVOTHYROXINE  (SYNTHROID ) 150 MCG TABLET    Take 1 tablet (150 mcg total) by mouth daily.   LINACLOTIDE  (LINZESS ) 72 MCG CAPSULE    Take 1 capsule (72 mcg total) by mouth daily before breakfast.   MELOXICAM  (MOBIC ) 7.5 MG TABLET    Take 1 tablet (7.5 mg total) by mouth 2 (two) times daily for 21 days.   ROSUVASTATIN  (CRESTOR ) 20 MG TABLET    Take 1 tablet (20 mg total) by mouth at bedtime.   SENNOSIDES-DOCUSATE SODIUM  (SENOKOT-S) 8.6-50 MG TABLET    Take 2 tablets by mouth daily.   VALSARTAN  (DIOVAN ) 320 MG TABLET    Take 1 tablet (320 mg total) by mouth daily.   VENTOLIN  HFA 108 (90 BASE) MCG/ACT INHALER    INHALE 1 TO 2 PUFFS INTO THE LUNGS EVERY 6 (SIX) HOURS AS NEEDED FOR WHEEZING OR SHORTNESS OF BREATH.   VITAMIN D , ERGOCALCIFEROL , (DRISDOL ) 1.25 MG (50000 UNIT) CAPS CAPSULE    Take 1 capsule (50,000 Units total) by mouth once a week.  Modified Medications   No medications on file  Discontinued Medications   No medications on file    Allergies Allergies  Allergen Reactions    Dust Mite Extract Hives and Itching   Shellfish Allergy Anaphylaxis    Past Medical History Past Medical History:  Diagnosis Date   Anemia    Anxiety    Asthma    Bilateral edema of lower extremity    Chronic pain of left knee  Constipation    Depression    Diabetes mellitus type 2 with neurological manifestations (HCC) 05/27/2017   Diabetes mellitus without complication (HCC)    Gallstones    Hypothyroidism    Joint pain    Lumbar pain    Migraine    Onychodystrophy    Onychomycosis    Other fatigue    Paresthesia 05/17/2017   Shortness of breath on exertion    Sickle cell trait    Sleep apnea    TIA (transient ischemic attack) 05/26/2017   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    Past Surgical History Past Surgical History:  Procedure Laterality Date   CHOLECYSTECTOMY     DILATION AND CURETTAGE OF UTERUS     HERNIA REPAIR      Family History family history includes Addison's disease in her mother; Asthma in her mother; Colon polyps in her mother; Diabetes in her maternal grandfather, mother, paternal grandmother, paternal uncle, and son; Glaucoma in her father and sister; Heart attack in her maternal aunt, maternal grandmother, and maternal uncle; Hypertension in her mother; Irritable bowel syndrome in her daughter and mother; Mental retardation in her sister; Stomach cancer in her maternal grandfather; Stroke in her maternal grandmother; Ulcerative colitis in her maternal grandfather.  Social History Social History   Socioeconomic History   Marital status: Single    Spouse name: Not on file   Number of children: 3   Years of education: Not on file   Highest education level: Associate degree: occupational, scientist, product/process development, or vocational program  Occupational History   Occupation: Counselling Psychologist: Games Developer    Comment: Works nights  Tobacco Use   Smoking status: Never   Smokeless tobacco: Never  Vaping Use    Vaping status: Never Used  Substance and Sexual Activity   Alcohol use: No   Drug use: Not Currently   Sexual activity: Yes    Partners: Male    Birth control/protection: I.U.D.    Comment: Liletta   Other Topics Concern   Not on file  Social History Narrative   Lives    Caffeine use:    Social Drivers of Health   Financial Resource Strain: Medium Risk (02/09/2024)   Overall Financial Resource Strain (CARDIA)    Difficulty of Paying Living Expenses: Somewhat hard  Food Insecurity: Food Insecurity Present (02/09/2024)   Hunger Vital Sign    Worried About Running Out of Food in the Last Year: Sometimes true    Ran Out of Food in the Last Year: Sometimes true  Transportation Needs: No Transportation Needs (02/09/2024)   PRAPARE - Administrator, Civil Service (Medical): No    Lack of Transportation (Non-Medical): No  Physical Activity: Inactive (02/09/2024)   Exercise Vital Sign    Days of Exercise per Week: 0 days    Minutes of Exercise per Session: Not on file  Stress: No Stress Concern Present (02/09/2024)   [employer] of Occupational Health - Occupational Stress Questionnaire    Feeling of Stress: Not at all  Social Connections: Socially Isolated (02/09/2024)   Social Connection and Isolation Panel    Frequency of Communication with Friends and Family: More than three times a week    Frequency of Social Gatherings with Friends and Family: More than three times a week    Attends Religious Services: Never    Database Administrator or Organizations: No    Attends Banker Meetings: Not on file  Marital Status: Never married  Intimate Partner Violence: Not At Risk (08/30/2022)   Received from [facility]   HITS    Over the last 12 months how often did your partner physically hurt you?: Never    Over the last 12 months how often did your partner insult you or talk down to you?: Never    Over the last 12 months how often did your partner threaten you  with physical harm?: Never    Over the last 12 months how often did your partner scream or curse at you?: Never    Lab Results  Component Value Date   HGBA1C 9.1 (A) 05/17/2024   HGBA1C 9.1 (A) 10/31/2023   HGBA1C 9.9 (A) 07/22/2023   Lab Results  Component Value Date   CHOL 124 11/11/2023   Lab Results  Component Value Date   HDL 41 11/11/2023   Lab Results  Component Value Date   LDLCALC 67 11/11/2023   Lab Results  Component Value Date   TRIG 79 11/11/2023   Lab Results  Component Value Date   CHOLHDL 3.0 11/11/2023   Lab Results  Component Value Date   CREATININE 1.05 (H) 11/11/2023   No results found for: GFR Lab Results  Component Value Date   MICROALBUR 11.0 07/22/2023      Component Value Date/Time   NA 140 11/11/2023 1643   K 3.8 11/11/2023 1643   CL 100 11/11/2023 1643   CO2 26 11/11/2023 1643   GLUCOSE 220 (H) 11/11/2023 1643   GLUCOSE 228 (H) 07/22/2023 1123   BUN 12 11/11/2023 1643   CREATININE 1.05 (H) 11/11/2023 1643   CREATININE 1.08 (H) 07/22/2023 1123   CALCIUM  9.2 11/11/2023 1643   PROT 6.2 11/11/2023 1643   ALBUMIN 4.1 11/11/2023 1643   AST 19 11/11/2023 1643   ALT 27 11/11/2023 1643   ALKPHOS 56 11/11/2023 1643   BILITOT 0.3 11/11/2023 1643   GFRNONAA 61 03/28/2020 1702   GFRAA 71 03/28/2020 1702      Latest Ref Rng & Units 11/11/2023    4:43 PM 07/22/2023   11:23 AM 05/25/2023   10:03 AM  BMP  Glucose 70 - 99 mg/dL 779  771  719   BUN 6 - 24 mg/dL 12  14  20    Creatinine 0.57 - 1.00 mg/dL 8.94  8.91  8.71   BUN/Creat Ratio 9 - 23 11  13  16    Sodium 134 - 144 mmol/L 140  140  140   Potassium 3.5 - 5.2 mmol/L 3.8  3.7  3.4   Chloride 96 - 106 mmol/L 100  101  98   CO2 20 - 29 mmol/L 26  30  29    Calcium  8.7 - 10.2 mg/dL 9.2  9.3  9.5        Component Value Date/Time   WBC 7.0 10/12/2022 1127   WBC 7.4 01/29/2021 0953   RBC 5.26 10/12/2022 1127   RBC 5.08 01/29/2021 0953   HGB 12.2 10/12/2022 1127   HCT 39.3  10/12/2022 1127   PLT 274 10/12/2022 1127   MCV 75 (L) 10/12/2022 1127   MCH 23.2 (L) 10/12/2022 1127   MCH 22.8 (L) 05/27/2017 0426   MCHC 31.0 (L) 10/12/2022 1127   MCHC 31.8 01/29/2021 0953   RDW 14.2 10/12/2022 1127   LYMPHSABS 2.8 10/12/2022 1127   MONOABS 0.4 01/29/2021 0953   EOSABS 0.2 10/12/2022 1127   BASOSABS 0.0 10/12/2022 1127     Parts of  this note may have been dictated using voice recognition software. There may be variances in spelling and vocabulary which are unintentional. Not all errors are proofread. Please notify the dino if any discrepancies are noted or if the meaning of any statement is not clear.

## 2024-05-17 NOTE — Patient Instructions (Addendum)
 Will recommend the following:  Trulicity  4.5 mg weekly  U500 insulin  30 units between 12pm-2pm, 190 units around 5:30pm-10pm, 50 units around 1am-5am -30 min before meals (takes BF around 6pm, lunch and midnight and dinner around 3 am) 30 min before meals. We will start you on an extended release version of metformin , which should help with the upset stomach. Start with one 500 mg capsule taken every evening with dinner. Stay on this for 3-5 days, then increase to 1 tablet with breakfast and one with dinner. If you do not have any upset stomach, gradually increase to the goal dose of 2 capsules with breakfast and 2 capsules with dinner. If you do have upset stomach, decrease it back to the previous dose that you tolerated.  Week 1: one tablet after dinner Week 2: one tablet after breakfast and one after dinner  Week 3: one tablet after breakfast and two after dinner  Week 4 and onwards: two tablets after breakfast and two after dinner

## 2024-05-18 ENCOUNTER — Encounter: Payer: Self-pay | Admitting: "Endocrinology

## 2024-05-21 ENCOUNTER — Telehealth: Payer: Self-pay | Admitting: Dietician

## 2024-05-21 ENCOUNTER — Other Ambulatory Visit: Payer: Self-pay

## 2024-05-21 ENCOUNTER — Other Ambulatory Visit (HOSPITAL_COMMUNITY): Payer: Self-pay

## 2024-05-21 ENCOUNTER — Encounter: Payer: Self-pay | Admitting: Pharmacist

## 2024-05-21 NOTE — Telephone Encounter (Signed)
 Called patient per MD request to be sure patient is not having hypoglycemia. Patient was not available.  Will call again tomorrow.  Leita Constable, RD, LDN, CDCES, DipACLM

## 2024-05-22 ENCOUNTER — Telehealth: Payer: Self-pay | Admitting: Dietician

## 2024-05-22 NOTE — Telephone Encounter (Signed)
 Called patient to follow up regarding blood glucose levels. Dexcom clarity report reviewed and posted below. Patient works nights as a health visitor.   She is taking her U-500 insulin  as prescribed.  190 units before breakfast between 5:30 - 10 pm, 50 units before lunch 1 am-5 am, and is sometimes waking during her sleep time to take 30 units between 12 pm and 2 pm as she frequently gets hyperglycemia when she sleeps.  She is continuing to get hypoglycemia most mornings between 3 am and 9 am (<40 at times).  She is working during this time.  She also is getting another low between 9 pm-11 pm in the 50's.  Will message MD to see if she wishes for any changes. Patient has an appointment to see Rock, RN, CDCES next week. Patient does not have a blood glucose meter.  Needs Accu Check with her insurance.  We can give her a free meter at the time of her appointment or a free meter voucher with a prescription.  Prescriptions have been requested.   Lindsey Bowen, RD, LDN, CDCES, DipACLM

## 2024-05-25 ENCOUNTER — Other Ambulatory Visit: Payer: Self-pay

## 2024-05-29 ENCOUNTER — Encounter: Admitting: Nutrition

## 2024-05-30 ENCOUNTER — Other Ambulatory Visit: Payer: Self-pay

## 2024-05-30 ENCOUNTER — Other Ambulatory Visit: Payer: Self-pay | Admitting: Nurse Practitioner

## 2024-05-30 ENCOUNTER — Encounter: Admitting: Nutrition

## 2024-05-30 DIAGNOSIS — M9915 Subluxation complex (vertebral) of pelvic region: Secondary | ICD-10-CM | POA: Diagnosis not present

## 2024-05-30 DIAGNOSIS — R809 Proteinuria, unspecified: Secondary | ICD-10-CM | POA: Insufficient documentation

## 2024-05-30 DIAGNOSIS — E1129 Type 2 diabetes mellitus with other diabetic kidney complication: Secondary | ICD-10-CM | POA: Insufficient documentation

## 2024-05-30 DIAGNOSIS — T782XXS Anaphylactic shock, unspecified, sequela: Secondary | ICD-10-CM

## 2024-05-30 DIAGNOSIS — M9914 Subluxation complex (vertebral) of sacral region: Secondary | ICD-10-CM | POA: Diagnosis not present

## 2024-05-30 DIAGNOSIS — M9913 Subluxation complex (vertebral) of lumbar region: Secondary | ICD-10-CM | POA: Diagnosis not present

## 2024-05-30 MED ORDER — EPINEPHRINE 0.3 MG/0.3ML IJ SOAJ
0.3000 mg | INTRAMUSCULAR | 0 refills | Status: AC | PRN
  Filled 2024-05-30: qty 2, 30d supply, fill #0

## 2024-05-30 MED FILL — Continuous Glucose System Sensor: 30 days supply | Qty: 3 | Fill #4 | Status: AC

## 2024-05-30 NOTE — Progress Notes (Signed)
 Pt. Having much stress.  Son is has metal problems and daughter has IBS and tried to commit suicide, sol provider.   Taking U-500 R insulin .  Sets her alarm to take this 30 minutes before her meals.   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  Eats breakfast of breakfast food-eggs and toast or leftovers from supper  Takes 80u 9AM-6PM: sleeps   7-9Supper at home or out on the way to work sandwich or fast food with no chips or fries.  Sometimes sweet drink, but this is rare.  12-2 PM; 40u 1AM-5AM: 30u Discussion:  Meals are balanced and not more than 45 grams of carbs- mostly around 35-40.   add 2u more if drinking sweet drink.  Try to limit this to once every week.  Is drinking water at all other meals. Discussed OmniPod and would like to start the new one.  Was on the older model which the sensor did not connect with the pod.  I will call the rep. To see if we can get a free starter kit for her.   Sensor is G7.     Lindsey Bowen was called and he said she was on a dash system in 2019 but can get OmniPod 5 for free with her insurance.

## 2024-05-30 NOTE — Patient Instructions (Signed)
 Limit sweet drinks to no more than one a week. Add 2 extra units to the meal when drinking a sweet drink

## 2024-06-05 ENCOUNTER — Other Ambulatory Visit: Payer: Self-pay

## 2024-06-05 ENCOUNTER — Encounter: Payer: Self-pay | Admitting: "Endocrinology

## 2024-06-05 MED FILL — Insulin Pen Needle 31 G X 5 MM (1/5" or 3/16"): 33 days supply | Qty: 200 | Fill #4 | Status: AC

## 2024-06-06 ENCOUNTER — Other Ambulatory Visit: Payer: Self-pay | Admitting: "Endocrinology

## 2024-06-06 ENCOUNTER — Other Ambulatory Visit: Payer: Self-pay

## 2024-06-06 ENCOUNTER — Telehealth: Payer: Self-pay

## 2024-06-06 ENCOUNTER — Other Ambulatory Visit (HOSPITAL_COMMUNITY): Payer: Self-pay

## 2024-06-06 MED ORDER — TIRZEPATIDE 5 MG/0.5ML ~~LOC~~ SOAJ
5.0000 mg | SUBCUTANEOUS | 0 refills | Status: AC
  Filled 2024-06-06: qty 2, 28d supply, fill #0

## 2024-06-06 MED ORDER — TIRZEPATIDE 5 MG/0.5ML ~~LOC~~ SOAJ: 5.0000 mg | SUBCUTANEOUS | 0 refills | Status: DC

## 2024-06-06 NOTE — Telephone Encounter (Signed)
 Pt needs pa for mounjaro . Thanks!

## 2024-06-06 NOTE — Telephone Encounter (Signed)
 Pharmacy Patient Advocate Encounter  Received notification from [employer] that Prior Authorization for Mounjaro  5mg /0.66ml has been APPROVED from 06/06/24 to 06/06/2025   PA #/Case ID/Reference #: [id number]

## 2024-06-06 NOTE — Telephone Encounter (Signed)
 Pharmacy Patient Advocate Encounter   Received notification from Pt Calls Messages that prior authorization for Mounjaro  5MG /0.5ML auto-injectors is required/requested.   Insurance verification completed.   The patient is insured through [employer].   Per test claim: PA required; PA submitted to above mentioned insurance via Latent Key/confirmation #/EOC [id number] Status is pending

## 2024-06-07 ENCOUNTER — Other Ambulatory Visit: Payer: Self-pay

## 2024-06-08 ENCOUNTER — Other Ambulatory Visit: Payer: Self-pay

## 2024-06-08 DIAGNOSIS — M25522 Pain in left elbow: Secondary | ICD-10-CM | POA: Diagnosis not present

## 2024-06-08 MED ORDER — DICLOFENAC SODIUM 1 % EX GEL
CUTANEOUS | 0 refills | Status: AC
  Filled 2024-06-08: qty 100, 17d supply, fill #0

## 2024-06-08 MED ORDER — MELOXICAM 7.5 MG PO TABS
7.5000 mg | ORAL_TABLET | Freq: Every day | ORAL | 1 refills | Status: AC
  Filled 2024-06-08: qty 10, 10d supply, fill #0

## 2024-06-14 ENCOUNTER — Other Ambulatory Visit: Payer: Self-pay

## 2024-06-14 ENCOUNTER — Ambulatory Visit: Admitting: "Endocrinology

## 2024-06-14 ENCOUNTER — Telehealth: Payer: Self-pay

## 2024-06-14 ENCOUNTER — Encounter: Payer: Self-pay | Admitting: "Endocrinology

## 2024-06-14 ENCOUNTER — Other Ambulatory Visit

## 2024-06-14 VITALS — BP 140/90 | HR 88 | Ht 65.0 in | Wt 260.0 lb

## 2024-06-14 DIAGNOSIS — Z7985 Long-term (current) use of injectable non-insulin antidiabetic drugs: Secondary | ICD-10-CM

## 2024-06-14 DIAGNOSIS — E039 Hypothyroidism, unspecified: Secondary | ICD-10-CM | POA: Diagnosis not present

## 2024-06-14 DIAGNOSIS — E1165 Type 2 diabetes mellitus with hyperglycemia: Secondary | ICD-10-CM | POA: Diagnosis not present

## 2024-06-14 DIAGNOSIS — Z794 Long term (current) use of insulin: Secondary | ICD-10-CM

## 2024-06-14 DIAGNOSIS — E7800 Pure hypercholesterolemia, unspecified: Secondary | ICD-10-CM

## 2024-06-14 MED ORDER — TIRZEPATIDE 7.5 MG/0.5ML ~~LOC~~ SOAJ
7.5000 mg | SUBCUTANEOUS | 0 refills | Status: DC
  Filled 2024-06-14 – 2024-07-04 (×3): qty 2, 28d supply, fill #0

## 2024-06-14 NOTE — Progress Notes (Signed)
 Outpatient Endocrinology Note Lindsey Birmingham, MD  06/14/2024   Lindsey Bowen 08/26/1970 [id number]  Referring Provider: Theotis Haze ORN, NP Primary Care Provider: Theotis Haze ORN, NP Reason for consultation: Subjective   Assessment & Plan  Diagnoses and all orders for this visit:  Uncontrolled type 2 diabetes mellitus with hyperglycemia (HCC)  Long-term (current) use of injectable non-insulin  antidiabetic drugs  Long-term insulin  use (HCC)  Insulin  dose changed (HCC)  Pure hypercholesterolemia  Acquired hypothyroidism -     TSH + free T4  Other orders -     tirzepatide  (MOUNJARO ) 7.5 MG/0.5ML Pen; Inject 7.5 mg into the skin once a week.   Hypothyroidism, on levothyroxine  175 mcg po every day, TSH was 0.2 On levothyroxine  to 150 mcg po every day, TSH is WNL Continue to take levothyroxine  first thing in the morning on empty stomach and wait at least 30 minutes to 1 hour before eating or drinking anything or taking any other medications. Space out levothyroxine  by 4 hours from any acid reflux medication, fibrate, iron, calcium , multivitamin, birth control pills and nutritional supplements.   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 BACK OFF U500 BY 5 UNITS IF BLOOD SUGAR IS LESS THAN 100 Metformin  XR 500mg  two tablets after breakfast and one after dinner  (takes BF around 6pm, lunch at midnight and dinner around 3 am) Previously discussed, Missing doses when missing meals Instructed to cut down sugary drinks 05/17/24: Ordered DM education tor  resume insulin  pump, been on omnipod before Counseled pt extensively on lifestyle changes, eating 3 equi-sized meals, insulin  management, risks involved with high doses insulin  and advised against soda DM educator will f/u on pt to make sure she has no lows in a few days    Ordered and explained baqsimi /glucagon  use and sent on 06/10/23 Jardiance gave UTI Couldn't tolerate metformin -tried different versions/doses for a year   Sees podiatry Dr Donnice JONELLE Fees   No known contraindications/side effects to any of above medications No history of MEN syndrome/medullary thyroid  cancer/pancreatitis or pancreatic cancer in self or family  -Last LD and Tg are as follows: Lab Results  Component Value Date   LDLCALC 67 11/11/2023    Lab Results  Component Value Date   TRIG 79 11/11/2023   -On rosuvastatin  20 mg QD -Follow low fat diet and exercise   -Blood pressure goal <140/90 - Microalbumin/creatinine goal is < 30 -Last MA/Cr is as follows: Lab Results  Component Value Date   MICROALBUR 11.0 07/22/2023   -on ACE/ARB valsartan  320 mg qd -diet changes including salt restriction -limit eating outside -counseled BP targets per standards of diabetes care -uncontrolled blood pressure can lead to retinopathy, nephropathy and cardiovascular and atherosclerotic heart disease  Reviewed and counseled on: -A1C target -Blood sugar targets -Complications of uncontrolled diabetes  -Checking blood sugar before meals and bedtime and bring log next visit -All medications with mechanism of action and side effects -Hypoglycemia management: rule of 15's, Glucagon  Emergency Kit and medical alert ID -low-carb low-fat plate-method diet -At least 20 minutes of physical activity per  day -Annual dilated retinal eye exam and foot exam -compliance and follow up needs -follow up as scheduled or earlier if problem gets worse  Call if blood sugar is less than 70 or consistently above 250    Take a 15 gm  snack of carbohydrate at bedtime before you go to sleep if your blood sugar is less than 100.    If you are going to fast after midnight for a test or procedure, ask your physician for instructions on how to reduce/decrease your insulin  dose.    Call if blood sugar is less than 70 or consistently above 250  -Treating a low sugar by rule of 15  (15 gms of sugar every 15 min until sugar is more than 70) If you feel your sugar is low, test your sugar to be sure If your sugar is low (less than 70), then take 15 grams of a fast acting Carbohydrate (3-4 glucose tablets or glucose gel or 4 ounces of juice or regular soda) Recheck your sugar 15 min after treating low to make sure it is more than 70 If sugar is still less than 70, treat again with 15 grams of carbohydrate          Don't drive the hour of hypoglycemia  If unconscious/unable to eat or drink by mouth, use glucagon  injection or nasal spray baqsimi  and call 911. Can repeat again in 15 min if still unconscious.  Return in about 6 weeks (around 07/27/2024).   I have reviewed current medications, nurse's notes, allergies, vital signs, past medical and surgical history, family medical history, and social history for this encounter. Counseled patient on symptoms, examination findings, lab findings, imaging results, treatment decisions and monitoring and prognosis. The patient understood the recommendations and agrees with the treatment plan. All questions regarding treatment plan were fully answered.  Lindsey Birmingham, MD  06/14/2024   History of Present Illness Lindsey Bowen is a 53 y.o. year old female who presents for follow up of Type II diabetes mellitus.  Lindsey Bowen was first diagnosed in 2012.   Diabetes education +  Home diabetes regimen: U500 insulin  190 units qam around 5:30pm-10pm, 90 units around 1am-5am -30 min before meals (takes BF around 6pm, lunch and midnight and dinner around 3 am) Trulicity  4.5 mg weekly   Metformin  XR 500mg  two tablets after breakfast and two after dinner  Previously was on: Toujeo  80 units at bedtime  Humalog  25 units 2-3 times a day, 15 min before meals   COMPLICATIONS +  MI/Stroke/TIA -  retinopathy -  neuropathy +  nephropathy  BLOOD SUGAR DATA CGM interpretation: At today's visit, we reviewed her CGM downloads. The full report is scanned in the media. Reviewing the CGM trends, BG are LOW BETWEEN 12AM-5PM, and high 4pm-9pm, 12 am-3am, and 9am-12pm.  Physical Exam  BP (!) 140/90   Pulse 88   Ht 5' 5 (1.651 m)   Wt 260 lb (117.9 kg)   SpO2 95%   BMI 43.27 kg/m    Constitutional: well developed, well nourished Head: normocephalic, atraumatic Eyes: sclera anicteric, no redness Neck: supple Lungs: normal respiratory effort Neurology: alert and oriented Skin: dry, no appreciable rashes Musculoskeletal: no appreciable defects Psychiatric: normal mood and affect Diabetic Foot Exam - Simple   No data filed      Current Medications Patient's Medications  New Prescriptions   TIRZEPATIDE  (MOUNJARO ) 7.5 MG/0.5ML PEN    Inject 7.5 mg into the skin once a week.  Previous Medications  BLOOD PRESSURE MONITOR DEVI    Please provide patient with insurance approved blood pressure monitor ICD I 10.0   CHLORTHALIDONE  (HYGROTON ) 25 MG TABLET    Take 0.5 tablets (12.5 mg total) by mouth daily.   CONTINUOUS GLUCOSE SENSOR (DEXCOM G7 SENSOR) MISC    APPLY 1 SENSOR EVERY 10 DAYS   DICLOFENAC  SODIUM (VOLTAREN ) 1 % GEL    APPLY 2 GRAMS TO THE AFFECTED AREA(S) BY TOPICAL ROUTE 3 TIMES PER DAY   EPINEPHRINE  0.3 MG/0.3 ML IJ SOAJ INJECTION    Inject 0.3 mg into the muscle as needed for anaphylaxis.   FLUTICASONE  (FLONASE ) 50 MCG/ACT NASAL SPRAY    Place 2 sprays into both nostrils daily as needed for allergies or rhinitis.   GLUCAGON  (BAQSIMI  ONE PACK) 3 MG/DOSE POWD    Place 1 Device into the nose as needed (Low blood sugar with impaired consciousness).   INSULIN  PEN NEEDLE  (EASY COMFORT PEN NEEDLES) 31G X 5 MM MISC    USE AS INSTRUCTED. CHECK BLOOD GLUCOSE LEVEL BY FINGERSTICK 5 TO 6 TIMES PER DAY.   INSULIN  REGULAR HUMAN CONCENTRATED (HUMULIN  R U-500 KWIKPEN) 500 UNIT/ML KWIKPEN    Inject 105 units every morning before breakfast, 70 units before lunch, and 30 units before supper-30 min before meals   LEVOCETIRIZINE (XYZAL) 5 MG TABLET    Take 5 mg by mouth daily as needed.   LEVONORGESTREL  (LILETTA , 52 MG,) 20.1 MCG/DAY IUD    1 each by Intrauterine route once.   LEVOTHYROXINE  (SYNTHROID ) 150 MCG TABLET    Take 1 tablet (150 mcg total) by mouth daily.   LINACLOTIDE  (LINZESS ) 72 MCG CAPSULE    Take 1 capsule (72 mcg total) by mouth daily before breakfast.   MELOXICAM  (MOBIC ) 7.5 MG TABLET    Take 1 tablet (7.5 mg total) by mouth daily.   METFORMIN  (GLUCOPHAGE -XR) 500 MG 24 HR TABLET    Take 2 tablets (1,000 mg total) by mouth 2 (two) times daily with a meal.   ROSUVASTATIN  (CRESTOR ) 20 MG TABLET    Take 1 tablet (20 mg total) by mouth at bedtime.   SENNOSIDES-DOCUSATE SODIUM  (SENOKOT-S) 8.6-50 MG TABLET    Take 2 tablets by mouth daily.   VALSARTAN  (DIOVAN ) 320 MG TABLET    Take 1 tablet (320 mg total) by mouth daily.   VENTOLIN  HFA 108 (90 BASE) MCG/ACT INHALER    INHALE 1 TO 2 PUFFS INTO THE LUNGS EVERY 6 (SIX) HOURS AS NEEDED FOR WHEEZING OR SHORTNESS OF BREATH.   VITAMIN D , ERGOCALCIFEROL , (DRISDOL ) 1.25 MG (50000 UNIT) CAPS CAPSULE    Take 1 capsule (50,000 Units total) by mouth once a week.  Modified Medications   No medications on file  Discontinued Medications   TIRZEPATIDE  (MOUNJARO ) 5 MG/0.5ML PEN    Inject 5 mg into the skin once a week.    Allergies Allergies  Allergen Reactions   Dust Mite Extract Hives and Itching   Shellfish Allergy Anaphylaxis    Past Medical History Past Medical History:  Diagnosis Date   Anemia    Anxiety    Asthma    Bilateral edema of lower extremity    Chronic pain of left knee    Constipation    Depression     Diabetes mellitus type 2 with neurological manifestations (HCC) 05/27/2017   Diabetes mellitus without complication (HCC)    Gallstones    Hypothyroidism    Joint pain    Lumbar pain    Migraine  Onychodystrophy    Onychomycosis    Other fatigue    Paresthesia 05/17/2017   Shortness of breath on exertion    Sickle cell trait    Sleep apnea    TIA (transient ischemic attack) 05/26/2017   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    Past Surgical History Past Surgical History:  Procedure Laterality Date   CHOLECYSTECTOMY     DILATION AND CURETTAGE OF UTERUS     HERNIA REPAIR      Family History family history includes Addison's disease in her mother; Asthma in her mother; Colon polyps in her mother; Diabetes in her maternal grandfather, mother, paternal grandmother, paternal uncle, and son; Glaucoma in her father and sister; Heart attack in her maternal aunt, maternal grandmother, and maternal uncle; Hypertension in her mother; Irritable bowel syndrome in her daughter and mother; Mental retardation in her sister; Stomach cancer in her maternal grandfather; Stroke in her maternal grandmother; Ulcerative colitis in her maternal grandfather.  Social History Social History   Socioeconomic History   Marital status: Single    Spouse name: Not on file   Number of children: 3   Years of education: Not on file   Highest education level: Associate degree: occupational, scientist, product/process development, or vocational program  Occupational History   Occupation: Counselling Psychologist: Games Developer    Comment: Works nights  Tobacco Use   Smoking status: Never   Smokeless tobacco: Never  Vaping Use   Vaping status: Never Used  Substance and Sexual Activity   Alcohol use: No   Drug use: Not Currently   Sexual activity: Yes    Partners: Male    Birth control/protection: I.U.D.    Comment: Liletta   Other Topics Concern   Not on file  Social History Narrative   Lives     Caffeine use:    Social Drivers of Health   Tobacco Use: Low Risk (06/14/2024)   Patient History    Smoking Tobacco Use: Never    Smokeless Tobacco Use: Never    Passive Exposure: Not on file  Financial Resource Strain: Medium Risk (02/09/2024)   Overall Financial Resource Strain (CARDIA)    Difficulty of Paying Living Expenses: Somewhat hard  Food Insecurity: Food Insecurity Present (02/09/2024)   Epic    Worried About Programme Researcher, Broadcasting/film/video in the Last Year: Sometimes true    Ran Out of Food in the Last Year: Sometimes true  Transportation Needs: No Transportation Needs (02/09/2024)   Epic    Lack of Transportation (Medical): No    Lack of Transportation (Non-Medical): No  Physical Activity: Inactive (02/09/2024)   Exercise Vital Sign    Days of Exercise per Week: 0 days    Minutes of Exercise per Session: Not on file  Stress: No Stress Concern Present (02/09/2024)   [employer] of Occupational Health - Occupational Stress Questionnaire    Feeling of Stress: Not at all  Social Connections: Socially Isolated (02/09/2024)   Social Connection and Isolation Panel    Frequency of Communication with Friends and Family: More than three times a week    Frequency of Social Gatherings with Friends and Family: More than three times a week    Attends Religious Services: Never    Database Administrator or Organizations: No    Attends Banker Meetings: Not on file    Marital Status: Never married  Intimate Partner Violence: Not At Risk (08/30/2022)   [facility]  HITS    Over the last 12 months how often did your partner physically hurt you?: Never    Over the last 12 months how often did your partner insult you or talk down to you?: Never    Over the last 12 months how often did your partner threaten you with physical harm?: Never    Over the last 12 months how often did your partner scream or curse at you?: Never  Depression (PHQ2-9): Low Risk (02/13/2024)    Depression (PHQ2-9)    PHQ-2 Score: 2  Alcohol Screen: Low Risk (08/29/2023)   Alcohol Screen    Last Alcohol Screening Score (AUDIT): 0  Housing: High Risk (02/09/2024)   Epic    Unable to Pay for Housing in the Last Year: Yes    Number of Times Moved in the Last Year: 0    Homeless in the Last Year: No  Utilities: At Risk (09/06/2023)   A[employer] Utilities    Threatened with loss of utilities: Yes  Health Literacy: Adequate Health Literacy (08/29/2023)   B1300 Health Literacy    Frequency of need for help with medical instructions: Never    Lab Results  Component Value Date   HGBA1C 9.1 (A) 05/17/2024   HGBA1C 9.1 (A) 10/31/2023   HGBA1C 9.9 (A) 07/22/2023   Lab Results  Component Value Date   CHOL 124 11/11/2023   Lab Results  Component Value Date   HDL 41 11/11/2023   Lab Results  Component Value Date   LDLCALC 67 11/11/2023   Lab Results  Component Value Date   TRIG 79 11/11/2023   Lab Results  Component Value Date   CHOLHDL 3.0 11/11/2023   Lab Results  Component Value Date   CREATININE 1.05 (H) 11/11/2023   No results found for: GFR Lab Results  Component Value Date   MICROALBUR 11.0 07/22/2023      Component Value Date/Time   NA 140 11/11/2023 1643   K 3.8 11/11/2023 1643   CL 100 11/11/2023 1643   CO2 26 11/11/2023 1643   GLUCOSE 220 (H) 11/11/2023 1643   GLUCOSE 228 (H) 07/22/2023 1123   BUN 12 11/11/2023 1643   CREATININE 1.05 (H) 11/11/2023 1643   CREATININE 1.08 (H) 07/22/2023 1123   CALCIUM  9.2 11/11/2023 1643   PROT 6.2 11/11/2023 1643   ALBUMIN 4.1 11/11/2023 1643   AST 19 11/11/2023 1643   ALT 27 11/11/2023 1643   ALKPHOS 56 11/11/2023 1643   BILITOT 0.3 11/11/2023 1643   GFRNONAA 61 03/28/2020 1702   GFRAA 71 03/28/2020 1702      Latest Ref Rng & Units 11/11/2023    4:43 PM 07/22/2023   11:23 AM 05/25/2023   10:03 AM  BMP  Glucose 70 - 99 mg/dL 779  771  719   BUN 6 - 24 mg/dL 12  14  20    Creatinine 0.57 - 1.00 mg/dL 8.94   8.91  8.71   BUN/Creat Ratio 9 - 23 11  13  16    Sodium 134 - 144 mmol/L 140  140  140   Potassium 3.5 - 5.2 mmol/L 3.8  3.7  3.4   Chloride 96 - 106 mmol/L 100  101  98   CO2 20 - 29 mmol/L 26  30  29    Calcium  8.7 - 10.2 mg/dL 9.2  9.3  9.5        Component Value Date/Time   WBC 7.0 10/12/2022 1127   WBC 7.4 01/29/2021 0953  RBC 5.26 10/12/2022 1127   RBC 5.08 01/29/2021 0953   HGB 12.2 10/12/2022 1127   HCT 39.3 10/12/2022 1127   PLT 274 10/12/2022 1127   MCV 75 (L) 10/12/2022 1127   MCH 23.2 (L) 10/12/2022 1127   MCH 22.8 (L) 05/27/2017 0426   MCHC 31.0 (L) 10/12/2022 1127   MCHC 31.8 01/29/2021 0953   RDW 14.2 10/12/2022 1127   LYMPHSABS 2.8 10/12/2022 1127   MONOABS 0.4 01/29/2021 0953   EOSABS 0.2 10/12/2022 1127   BASOSABS 0.0 10/12/2022 1127     Parts of this note may have been dictated using voice recognition software. There may be variances in spelling and vocabulary which are unintentional. Not all errors are proofread. Please notify the dino if any discrepancies are noted or if the meaning of any statement is not clear.

## 2024-06-14 NOTE — Telephone Encounter (Signed)
 Called pt to change appt to vv and pt said that she decided to come to her appt in office.

## 2024-06-14 NOTE — Patient Instructions (Addendum)
 Will recommend the following: Mounjaro  5 mg weekly-> 7.5mg /week U500 insulin  35 units between 12pm-2pm, 185 units around 5:30pm-10pm, 25 units around 1am-5am -(takes BF around 6pm, lunch and midnight and dinner around 3 am) 30 min before meals. BACK OFF U500 BY 5 UNITS IF BLOOD SUGAR IS LESS THAN 100 Metformin  XR 500mg  two tablets after breakfast and one after dinner  ______________    Goals of DM therapy:  Morning Fasting blood sugar: 80-140  Blood sugar before meals: 80-140 Bed time blood sugar: 100-150  A1C <7%, limited only by hypoglycemia  1.Diabetes medications and their side effects discussed, including hypoglycemia    2. Check blood glucose:  a) Always check blood sugars before driving. Please see below (under hypoglycemia) on how to manage b) Check a minimum of 3 times/day or more as needed when having symptoms of hypoglycemia.   c) Try to check blood glucose before sleeping/in the middle of the night to ensure that it is remaining stable and not dropping less than 100 d) Check blood glucose more often if sick  3. Diet: a) 3 meals per day schedule b: Restrict carbs to 60-70 grams (4 servings) per meal c) Colorful vegetables - 3 servings a day, and low sugar fruit 2 servings/day Plate control method: 1/4 plate protein, 1/4 starch, 1/2 green, yellow, or red vegetables d) Avoid carbohydrate snacks unless hypoglycemic episode, or increased physical activity  4. Regular exercise as tolerated, preferably 3 or more hours a week  5. Hypoglycemia: a)  Do not drive or operate machinery without first testing blood glucose to assure it is over 90 mg%, or if dizzy, lightheaded, not feeling normal, etc, or  if foot or leg is numb or weak. b)  If blood glucose less than 70, take four 5gm Glucose tabs or 15-30 gm Glucose gel.  Repeat every 15 min as needed until blood sugar is >100 mg/dl. If hypoglycemia persists then call 911.   6. Sick day management: a) Check blood glucose more  often b) Continue usual therapy if blood sugars are elevated.   7. Contact the doctor immediately if blood glucose is frequently <60 mg/dl, or an episode of severe hypoglycemia occurs (where someone had to give you glucose/  glucagon  or if you passed out from a low blood glucose), or if blood glucose is persistently >350 mg/dl, for further management  8. A change in level of physical activity or exercise and a change in diet may also affect your blood sugar. Check blood sugars more often and call if needed.  Instructions: 1. Bring glucose meter, blood glucose records on every visit for review 2. Continue to follow up with primary care physician and other providers for medical care 3. Yearly eye  and foot exam 4. Please get blood work done prior to the next appointment

## 2024-06-15 LAB — TSH+FREE T4: TSH W/REFLEX TO FT4: 3.31 m[IU]/L

## 2024-06-18 ENCOUNTER — Encounter: Payer: Self-pay | Admitting: "Endocrinology

## 2024-06-26 ENCOUNTER — Other Ambulatory Visit: Payer: Self-pay

## 2024-06-27 ENCOUNTER — Encounter: Payer: Self-pay | Admitting: Nurse Practitioner

## 2024-06-27 NOTE — Telephone Encounter (Signed)
Pls schedule video visit

## 2024-06-29 ENCOUNTER — Telehealth: Payer: Self-pay | Admitting: Nurse Practitioner

## 2024-06-29 NOTE — Telephone Encounter (Signed)
 Pt confirmed appt

## 2024-06-29 NOTE — Telephone Encounter (Signed)
 Appointment scheduled.

## 2024-07-02 ENCOUNTER — Ambulatory Visit: Payer: Self-pay | Admitting: Nurse Practitioner

## 2024-07-02 ENCOUNTER — Encounter: Payer: Self-pay | Admitting: Nurse Practitioner

## 2024-07-02 ENCOUNTER — Other Ambulatory Visit: Payer: Self-pay

## 2024-07-02 DIAGNOSIS — F32A Depression, unspecified: Secondary | ICD-10-CM | POA: Diagnosis not present

## 2024-07-02 DIAGNOSIS — J302 Other seasonal allergic rhinitis: Secondary | ICD-10-CM

## 2024-07-02 DIAGNOSIS — F419 Anxiety disorder, unspecified: Secondary | ICD-10-CM | POA: Diagnosis not present

## 2024-07-02 DIAGNOSIS — M51361 Other intervertebral disc degeneration, lumbar region with lower extremity pain only: Secondary | ICD-10-CM | POA: Diagnosis not present

## 2024-07-02 MED ORDER — ALBUTEROL SULFATE HFA 108 (90 BASE) MCG/ACT IN AERS
1.0000 | INHALATION_SPRAY | RESPIRATORY_TRACT | 1 refills | Status: AC | PRN
  Filled 2024-07-02: qty 20.1, 51d supply, fill #0

## 2024-07-02 NOTE — Progress Notes (Signed)
 " Virtual Visit Consent   Lindsey Bowen, you are scheduled for a virtual visit with a  provider today. Just as with appointments in the office, your consent must be obtained to participate. Your consent will be active for this visit and any virtual visit you may have with one of our providers in the next 365 days. If you have a MyChart account, a copy of this consent can be sent to you electronically.  As this is a virtual visit, video technology does not allow for your provider to perform a traditional examination. This may limit your provider's ability to fully assess your condition. If your provider identifies any concerns that need to be evaluated in person or the need to arrange testing (such as labs, EKG, etc.), we will make arrangements to do so. Although advances in technology are sophisticated, we cannot ensure that it will always work on either your end or our end. If the connection with a video visit is poor, the visit may have to be switched to a telephone visit. With either a video or telephone visit, we are not always able to ensure that we have a secure connection.  By engaging in this virtual visit, you consent to the provision of healthcare and authorize for your insurance to be billed (if applicable) for the services provided during this visit. Depending on your insurance coverage, you may receive a charge related to this service.  I need to obtain your verbal consent now. Are you willing to proceed with your visit today? Lindsey Bowen has provided verbal consent on 07/02/2024 for a virtual visit (video or telephone). Haze LELON Servant, NP  Date: 07/02/2024 12:32 PM   Virtual Visit via Video Note   I, Haze LELON Servant, connected with  Lindsey Bowen  ([id number], May 06, 1971) on 07/02/2024 at 11:10 AM EST by a video-enabled telemedicine application and verified that I am speaking with the correct person using two identifiers.  Location: Patient: Virtual Visit Location Patient:  Home Provider: Virtual Visit Location Provider: Home Office   I discussed the limitations of evaluation and management by telemedicine and the availability of in person appointments. The patient expressed understanding and agreed to proceed.    History of Present Illness: Lindsey Bowen is a 54 y.o. who identifies as a female who was assigned female at birth, and is being seen today for request of PCS services.  Lindsey Bowen states she needs time off from work just to get some relief.  She has been experiencing difficulty managing her household including cooking which involves standing for prolonged periods of time. She has a history of chronic back pain requiring radiofrequency ablation. ADLs she needs assistance with include bathing due to her back pain. She sometimes has to wash downstairs in the sink as she is unable to go upstairs to the bathroom shower/tub due to back pain. She also states her depression causes anhedonia and interferes with her personal care and household activities.    Problems:  Patient Active Problem List   Diagnosis Date Noted   Arthropathy of lumbar facet joint 08/02/2022   Lumbar spondylosis 08/02/2022   Low back pain 08/02/2022   DM (diabetes mellitus) with complications (HCC) 10/21/2021   Vitamin D  deficiency 10/21/2021   Mixed diabetic hyperlipidemia associated with type 2 diabetes mellitus (HCC) 09/23/2021   Ingrown toenail 09/07/2021   Type 2 diabetes mellitus with microalbuminuria, with long-term current use of insulin  (HCC) 04/16/2021   Degeneration of lumbar intervertebral disc 09/24/2020   IUD (intrauterine  device) in place 09/23/2020   Well woman exam 06/17/2020   Severe obesity (BMI 35.0-35.9 with comorbidity) (HCC) 04/08/2019   Plantar fasciitis 02/16/2019   History of TIA (transient ischemic attack) 06/29/2017   Slurred speech 06/28/2017   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   Diabetes mellitus type 2 with  neurological manifestations (HCC) 05/27/2017   Hypertension associated with type 2 diabetes mellitus (HCC) 05/27/2017   Hyperlipidemia associated with type 2 diabetes mellitus (HCC) 05/27/2017   Hypothyroidism 05/27/2017   Hyperlipidemia LDL goal <70 05/27/2017   Essential hypertension 05/27/2017   TIA (transient ischemic attack) 05/26/2017   Paresthesia 05/17/2017   Iron deficiency 04/19/2016   Sleep apnea 04/19/2016   Chronic bilateral low back pain with left-sided sciatica 02/13/2016   Onychodystrophy 08/26/2015   Onychomycosis 08/26/2015    Allergies: Allergies[1] Medications: Current Medications[2]  Observations/Objective: Patient is well-developed, well-nourished in no acute distress.  Resting comfortably at home.  Head is normocephalic, atraumatic.  No labored breathing.  Speech is clear and coherent with logical content.  Patient is alert and oriented at baseline.    Assessment and Plan: 1. Degeneration of intervertebral disc of lumbar region with lower extremity pain (Primary)  2. Seasonal allergies - albuterol  (VENTOLIN  HFA) 108 (90 Base) MCG/ACT inhaler; Inhale 1-2 puffs into the lungs every 4 (four) hours as needed for wheezing or shortness of breath.  Dispense: 20.1 g; Refill: 1  3. Anxiety and depression  PCS form has been completed.   Follow Up Instructions: I discussed the assessment and treatment plan with the patient. The patient was provided an opportunity to ask questions and all were answered. The patient agreed with the plan and demonstrated an understanding of the instructions.  A copy of instructions were sent to the patient via MyChart unless otherwise noted below.     The patient was advised to call back or seek an in-person evaluation if the symptoms worsen or if the condition fails to improve as anticipated.    Chanda Laperle W Willma Obando, NP      [1]  Allergies Allergen Reactions   Dust Mite Extract Hives and Itching   Shellfish Allergy Anaphylaxis   [2]  Current Outpatient Medications:    Blood Pressure Monitor DEVI, Please provide patient with insurance approved blood pressure monitor ICD I 10.0, Disp: 1 each, Rfl: 0   chlorthalidone  (HYGROTON ) 25 MG tablet, Take 0.5 tablets (12.5 mg total) by mouth daily., Disp: 45 tablet, Rfl: 1   Continuous Glucose Sensor (DEXCOM G7 SENSOR) MISC, APPLY 1 SENSOR EVERY 10 DAYS, Disp: 9 each, Rfl: 1   diclofenac  Sodium (VOLTAREN ) 1 % GEL, APPLY 2 GRAMS TO THE AFFECTED AREA(S) BY TOPICAL ROUTE 3 TIMES PER DAY, Disp: 100 g, Rfl: 0   EPINEPHrine  0.3 mg/0.3 mL IJ SOAJ injection, Inject 0.3 mg into the muscle as needed for anaphylaxis., Disp: 2 each, Rfl: 0   fluticasone  (FLONASE ) 50 MCG/ACT nasal spray, Place 2 sprays into both nostrils daily as needed for allergies or rhinitis., Disp: , Rfl:    Glucagon  (BAQSIMI  ONE PACK) 3 MG/DOSE POWD, Place 1 Device into the nose as needed (Low blood sugar with impaired consciousness)., Disp: 2 each, Rfl: 3   Insulin  Pen Needle (EASY COMFORT PEN NEEDLES) 31G X 5 MM MISC, USE AS INSTRUCTED. CHECK BLOOD GLUCOSE LEVEL BY FINGERSTICK 5 TO 6 TIMES PER DAY., Disp: 200 each, Rfl: 6   insulin  regular human CONCENTRATED (HUMULIN  R U-500 KWIKPEN) 500 UNIT/ML KwikPen, Inject 105 units every morning before  breakfast, 70 units before lunch, and 30 units before supper-30 min before meals (Patient taking differently: Inject 130 units every morning before breakfast, 80-90 units before lunch, and 30 units before supper-30 min before meals), Disp: 45 mL, Rfl: 3   levocetirizine (XYZAL) 5 MG tablet, Take 5 mg by mouth daily as needed., Disp: , Rfl:    levonorgestrel  (LILETTA , 52 MG,) 20.1 MCG/DAY IUD, 1 each by Intrauterine route once., Disp: , Rfl:    levothyroxine  (SYNTHROID ) 150 MCG tablet, Take 1 tablet (150 mcg total) by mouth daily., Disp: 90 tablet, Rfl: 1   linaclotide  (LINZESS ) 72 MCG capsule, Take 1 capsule (72 mcg total) by mouth daily before breakfast., Disp: 90 capsule, Rfl: 1    meloxicam  (MOBIC ) 7.5 MG tablet, Take 1 tablet (7.5 mg total) by mouth daily., Disp: 10 tablet, Rfl: 1   metFORMIN  (GLUCOPHAGE -XR) 500 MG 24 hr tablet, Take 2 tablets (1,000 mg total) by mouth 2 (two) times daily with a meal., Disp: 120 tablet, Rfl: 3   rosuvastatin  (CRESTOR ) 20 MG tablet, Take 1 tablet (20 mg total) by mouth at bedtime., Disp: 90 tablet, Rfl: 1   sennosides-docusate sodium  (SENOKOT-S) 8.6-50 MG tablet, Take 2 tablets by mouth daily., Disp: 180 tablet, Rfl: 1   tirzepatide  (MOUNJARO ) 7.5 MG/0.5ML Pen, Inject 7.5 mg into the skin once a week., Disp: 2 mL, Rfl: 0   valsartan  (DIOVAN ) 320 MG tablet, Take 1 tablet (320 mg total) by mouth daily., Disp: 90 tablet, Rfl: 3   albuterol  (VENTOLIN  HFA) 108 (90 Base) MCG/ACT inhaler, Inhale 1-2 puffs into the lungs every 4 (four) hours as needed for wheezing or shortness of breath., Disp: 20.1 g, Rfl: 1   Vitamin D , Ergocalciferol , (DRISDOL ) 1.25 MG (50000 UNIT) CAPS capsule, Take 1 capsule (50,000 Units total) by mouth once a week., Disp: 12 capsule, Rfl: 0  "

## 2024-07-04 ENCOUNTER — Other Ambulatory Visit: Payer: Self-pay

## 2024-07-04 ENCOUNTER — Encounter: Payer: Self-pay | Admitting: "Endocrinology

## 2024-07-04 DIAGNOSIS — E1165 Type 2 diabetes mellitus with hyperglycemia: Secondary | ICD-10-CM

## 2024-07-04 MED ORDER — DEXCOM G7 15 DAY SENSOR MISC
1.0000 | 3 refills | Status: AC
  Filled 2024-07-04: qty 6, fill #0
  Filled 2024-07-04: qty 2, 30d supply, fill #0
  Filled 2024-07-31: qty 2, 30d supply, fill #1

## 2024-07-05 ENCOUNTER — Other Ambulatory Visit: Payer: Self-pay

## 2024-07-06 ENCOUNTER — Other Ambulatory Visit: Payer: Self-pay

## 2024-07-06 MED ORDER — FLUZONE 0.5 ML IM SUSY
0.5000 mL | PREFILLED_SYRINGE | Freq: Once | INTRAMUSCULAR | 0 refills | Status: AC
  Filled 2024-07-06: qty 0.5, 1d supply, fill #0

## 2024-07-10 ENCOUNTER — Telehealth: Payer: Self-pay

## 2024-07-10 NOTE — Telephone Encounter (Signed)
 Patient identified by name and date of birth.  Patient has been made aware of denial for [facility] services.

## 2024-07-20 ENCOUNTER — Telehealth: Payer: Self-pay | Admitting: Podiatry

## 2024-07-20 NOTE — Telephone Encounter (Signed)
 Left a VM for the patient, Lindsey Bowen, to give us  a call back. They've scheduled an appt with Dr. Gershon on 08/17/24 on MyChart. I was calling to get a little more clarity on the appt note the patient had made to make sure they receive the best possible care when they come for they're visit   Patient Notes: 'Make nails and feet are still healthy and no yeast infection growing or ingrown toenail'

## 2024-07-26 ENCOUNTER — Ambulatory Visit: Admitting: "Endocrinology

## 2024-07-31 ENCOUNTER — Other Ambulatory Visit: Payer: Self-pay

## 2024-07-31 ENCOUNTER — Other Ambulatory Visit: Payer: Self-pay | Admitting: "Endocrinology

## 2024-07-31 MED ORDER — MOUNJARO 7.5 MG/0.5ML ~~LOC~~ SOAJ
7.5000 mg | SUBCUTANEOUS | 0 refills | Status: AC
  Filled 2024-07-31: qty 2, 28d supply, fill #0

## 2024-07-31 MED FILL — "Insulin Pen Needle 31 G X 5 MM (1/5"" or 3/16"")": 33 days supply | Qty: 200 | Fill #5 | Status: AC

## 2024-08-01 ENCOUNTER — Other Ambulatory Visit: Payer: Self-pay

## 2024-08-03 ENCOUNTER — Telehealth: Admitting: "Endocrinology

## 2024-08-03 ENCOUNTER — Encounter: Payer: Self-pay | Admitting: "Endocrinology

## 2024-08-03 VITALS — Ht 65.0 in | Wt 260.0 lb

## 2024-08-03 DIAGNOSIS — E7800 Pure hypercholesterolemia, unspecified: Secondary | ICD-10-CM

## 2024-08-03 DIAGNOSIS — E1165 Type 2 diabetes mellitus with hyperglycemia: Secondary | ICD-10-CM

## 2024-08-03 DIAGNOSIS — Z794 Long term (current) use of insulin: Secondary | ICD-10-CM

## 2024-08-03 DIAGNOSIS — Z7984 Long term (current) use of oral hypoglycemic drugs: Secondary | ICD-10-CM

## 2024-08-03 DIAGNOSIS — Z7985 Long-term (current) use of injectable non-insulin antidiabetic drugs: Secondary | ICD-10-CM

## 2024-08-03 NOTE — Progress Notes (Signed)
 "  The patient reports they are currently: Hand. I spent 14 minutes on the video with the patient on the date of service. I spent an additional 4 minutes on pre- and post-visit activities on the date of service.   The patient was physically located in [place]  or a state in which I am permitted to provide care. The patient and/or parent/guardian understood that s/he may incur co-pays and cost sharing, and agreed to the telemedicine visit. The visit was reasonable and appropriate under the circumstances given the patient's presentation at the time.  The patient and/or parent/guardian understands the potential risks and limitations of this mode of treatment (including, but not limited to, the absence of in-person examination) and has agreed to be treated using telemedicine. The patient's/patient's family's questions regarding telemedicine have been answered.   The patient and/or parent/guardian will contact their provider's office for worsening conditions, and seek emergency medical treatment and/or call 911 if the patient deems either necessary.    Outpatient Endocrinology Note Lindsey Birmingham, MD  08/03/24   Lindsey Bowen Dec 18, 1970 [id number]  Referring Provider: Theotis Haze ORN, NP Primary Care Provider: Theotis Haze ORN, NP Reason for consultation: Subjective   Assessment & Plan  Lindsey Bowen was seen today for diabetes.  Diagnoses and all orders for this visit:  Uncontrolled type 2 diabetes mellitus with hyperglycemia (HCC) -     Cancel: Microalbumin / creatinine urine ratio -     Microalbumin / creatinine urine ratio  Long term (current) use of oral hypoglycemic drugs  Long-term (current) use of injectable non-insulin  antidiabetic drugs  Long-term insulin  use (HCC)  Insulin  dose changed (HCC)  Pure hypercholesterolemia    Hypothyroidism, on levothyroxine  175 mcg po every day, TSH was 0.2 On levothyroxine  to 150 mcg po every day, TSH is WNL Continue to  take levothyroxine  first thing in the morning on empty stomach and wait at least 30 minutes to 1 hour before eating or drinking anything or taking any other medications. Space out levothyroxine  by 4 hours from any acid reflux medication, fibrate, iron, calcium , multivitamin, birth control pills and nutritional supplements.   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 BACK OFF U500 BY 5 UNITS IF BLOOD SUGAR IS LESS THAN 100 Metformin  XR 500mg  two tablets after breakfast and one after dinner  (takes BF around 6pm, lunch at midnight and dinner around 3 am) Previously discussed, Missing doses when missing meals Instructed to cut down sugary drinks 05/17/24: Ordered DM education tor resume insulin  pump, been on omnipod before Counseled pt extensively on lifestyle changes, eating 3 equi-sized meals, insulin  management, risks involved with high doses insulin  and advised against soda DM educator will f/u on pt to make sure she has no lows in a few days    Ordered and explained baqsimi /glucagon  use and sent on 06/10/23 Jardiance gave UTI Couldn't tolerate metformin -tried different versions/doses for a year   Sees podiatry Dr Donnice JONELLE Fees   No known contraindications/side effects to any of above medications No history of MEN syndrome/medullary thyroid  cancer/pancreatitis or pancreatic cancer in self or family  -  Last LD and Tg are as follows: Lab Results  Component Value Date   LDLCALC 67 11/11/2023    Lab Results  Component Value Date   TRIG 79 11/11/2023   -On rosuvastatin  20  mg QD -Follow low fat diet and exercise   -Blood pressure goal <140/90 - Microalbumin/creatinine goal is < 30 -Last MA/Cr is as follows: Lab Results  Component Value Date   MICROALBUR 11.0 07/22/2023   -on ACE/ARB valsartan  320 mg qd -diet changes including salt restriction -limit eating outside -counseled BP targets per standards of diabetes care -uncontrolled blood pressure can lead to retinopathy, nephropathy and cardiovascular and atherosclerotic heart disease  Reviewed and counseled on: -A1C target -Blood sugar targets -Complications of uncontrolled diabetes  -Checking blood sugar before meals and bedtime and bring log next visit -All medications with mechanism of action and side effects -Hypoglycemia management: rule of 15's, Glucagon  Emergency Kit and medical alert ID -low-carb low-fat plate-method diet -At least 20 minutes of physical activity per day -Annual dilated retinal eye exam and foot exam -compliance and follow up needs -follow up as scheduled or earlier if problem gets worse  Call if blood sugar is less than 70 or consistently above 250    Take a 15 gm snack of carbohydrate at bedtime before you go to sleep if your blood sugar is less than 100.    If you are going to fast after midnight for a test or procedure, ask your physician for instructions on how to reduce/decrease your insulin  dose.    Call if blood sugar is less than 70 or consistently above 250  -Treating a low sugar by rule of 15  (15 gms of sugar every 15 min until sugar is more than 70) If you feel your sugar is low, test your sugar to be sure If your sugar is low (less than 70), then take 15 grams of a fast acting Carbohydrate (3-4 glucose tablets or glucose gel or 4 ounces of juice or regular soda) Recheck your sugar 15 min after treating low to make sure it is more than 70 If sugar is still less than 70, treat again with 15 grams of carbohydrate          Don't drive the hour of hypoglycemia   If unconscious/unable to eat or drink by mouth, use glucagon  injection or nasal spray baqsimi  and call 911. Can repeat again in 15 min if still unconscious.  Return in about 4 weeks (around 08/31/2024) for visit + labs before next visit.   I have reviewed current medications, nurse's notes, allergies, vital signs, past medical and surgical history, family medical history, and social history for this encounter. Counseled patient on symptoms, examination findings, lab findings, imaging results, treatment decisions and monitoring and prognosis. The patient understood the recommendations and agrees with the treatment plan. All questions regarding treatment plan were fully answered.  Lindsey Birmingham, MD  08/03/24   History of Present Illness Lindsey Bowen is a 54 y.o. year old female who presents for follow up of Type II diabetes mellitus.  Bexleigh Schendel was first diagnosed in 2012.   Diabetes education +  Home diabetes regimen: Mounjaro  5mg /week U500 insulin  35 units between 12pm-2pm, 180-185 units around 5:30pm-10pm, 25 units around 1am-5am -(takes BF around 6pm, lunch and midnight and dinner around 3 am) 30 min before meals. BACK OFF U500 BY 5 UNITS IF BLOOD SUGAR IS LESS THAN 100 Metformin  XR 500mg  two tablets after breakfast and one after dinner  Previously  on, Trulicity  4.5 mg weekly  Metformin  XR 500mg  two tablets after breakfast and two after dinner  Previously was on: Toujeo  80 units at bedtime  Humalog  25 units 2-3 times a day, 15 min before meals   COMPLICATIONS +  MI/Stroke/TIA -  retinopathy -  neuropathy +  nephropathy  BLOOD SUGAR DATA CGM interpretation: At today's visit, we reviewed her CGM downloads. The full report is scanned in the media. Reviewing the CGM trends, BG are LOW BETWEEN 4AM-9AM, and high 12 pm-3am.                  Physical Exam  Ht 5' 5 (1.651 m)   Wt 260 lb (117.9 kg)   BMI 43.27 kg/m    Constitutional: well developed,  well nourished Head: normocephalic, atraumatic Eyes: sclera anicteric, no redness Neck: supple Lungs: normal respiratory effort Neurology: alert and oriented Skin: dry, no appreciable rashes Musculoskeletal: no appreciable defects Psychiatric: normal mood and affect Diabetic Foot Exam - Simple   No data filed      Current Medications Patient's Medications  New Prescriptions   No medications on file  Previous Medications   ALBUTEROL  (VENTOLIN  HFA) 108 (90 BASE) MCG/ACT INHALER    Inhale 1-2 puffs into the lungs every 4 (four) hours as needed for wheezing or shortness of breath.   BLOOD PRESSURE MONITOR DEVI    Please provide patient with insurance approved blood pressure monitor ICD I 10.0   CHLORTHALIDONE  (HYGROTON ) 25 MG TABLET    Take 0.5 tablets (12.5 mg total) by mouth daily.   CONTINUOUS GLUCOSE SENSOR (DEXCOM G7 15 DAY SENSOR) MISC    Use every 15 days.   DICLOFENAC  SODIUM (VOLTAREN ) 1 % GEL    APPLY 2 GRAMS TO THE AFFECTED AREA(S) BY TOPICAL ROUTE 3 TIMES PER DAY   EPINEPHRINE  0.3 MG/0.3 ML IJ SOAJ INJECTION    Inject 0.3 mg into the muscle as needed for anaphylaxis.   FLUTICASONE  (FLONASE ) 50 MCG/ACT NASAL SPRAY    Place 2 sprays into both nostrils daily as needed for allergies or rhinitis.   GLUCAGON  (BAQSIMI  ONE PACK) 3 MG/DOSE POWD    Place 1 Device into the nose as needed (Low blood sugar with impaired consciousness).   INSULIN  PEN NEEDLE (EASY COMFORT PEN NEEDLES) 31G X 5 MM MISC    USE AS INSTRUCTED. CHECK BLOOD GLUCOSE LEVEL BY FINGERSTICK 5 TO 6 TIMES PER DAY.   INSULIN  REGULAR HUMAN CONCENTRATED (HUMULIN  R U-500 KWIKPEN) 500 UNIT/ML KWIKPEN    Inject 105 units every morning before breakfast, 70 units before lunch, and 30 units before supper-30 min before meals   LEVOCETIRIZINE (XYZAL) 5 MG TABLET    Take 5 mg by mouth daily as needed.   LEVONORGESTREL  (LILETTA , 52 MG,) 20.1 MCG/DAY IUD    1 each by Intrauterine route once.   LEVOTHYROXINE  (SYNTHROID ) 150 MCG TABLET     Take 1 tablet (150 mcg total) by mouth daily.   LINACLOTIDE  (LINZESS ) 72 MCG CAPSULE    Take 1 capsule (72 mcg total) by mouth daily before breakfast.   MELOXICAM  (MOBIC ) 7.5 MG TABLET    Take 1 tablet (7.5 mg total) by mouth daily.   METFORMIN  (GLUCOPHAGE -XR) 500 MG 24 HR TABLET    Take 2 tablets (1,000 mg total) by mouth 2 (two) times daily with a meal.   ROSUVASTATIN  (CRESTOR ) 20 MG TABLET    Take 1 tablet (20 mg total) by mouth at bedtime.   SENNOSIDES-DOCUSATE SODIUM  (SENOKOT-S) 8.6-50 MG  TABLET    Take 2 tablets by mouth daily.   TIRZEPATIDE  (MOUNJARO ) 7.5 MG/0.5ML PEN    Inject 7.5 mg into the skin once a week.   VALSARTAN  (DIOVAN ) 320 MG TABLET    Take 1 tablet (320 mg total) by mouth daily.   VITAMIN D , ERGOCALCIFEROL , (DRISDOL ) 1.25 MG (50000 UNIT) CAPS CAPSULE    Take 1 capsule (50,000 Units total) by mouth once a week.  Modified Medications   No medications on file  Discontinued Medications   No medications on file    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   Diabetes mellitus without complication (HCC)    Gallstones    Hypothyroidism    Joint pain    Lumbar pain    Migraine    Onychodystrophy    Onychomycosis    Other fatigue    Paresthesia 05/17/2017   Shortness of breath on exertion    Sickle cell trait    Sleep apnea    TIA (transient ischemic attack) 05/26/2017   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    Past Surgical History Past Surgical History:  Procedure Laterality Date   CHOLECYSTECTOMY     DILATION AND CURETTAGE OF UTERUS     HERNIA REPAIR      Family History family history  includes Addison's disease in her mother; Asthma in her mother; Colon polyps in her mother; Diabetes in her maternal grandfather, mother, paternal grandmother, paternal uncle, and son; Glaucoma in her father and sister; Heart attack in her maternal aunt, maternal grandmother, and maternal uncle; Hypertension in her mother; Irritable bowel syndrome in her daughter and mother; Mental retardation in her sister; Stomach cancer in her maternal grandfather; Stroke in her maternal grandmother; Ulcerative colitis in her maternal grandfather.  Social History Social History   Socioeconomic History   Marital status: Single    Spouse name: Not on file   Number of children: 3   Years of education: Not on file   Highest education level: Some college, no degree  Occupational History   Occupation: Counselling Psychologist: [employer]    Comment: Works nights  Tobacco Use   Smoking status: Never   Smokeless tobacco: Never  Vaping Use   Vaping status: Never Used  Substance and Sexual Activity   Alcohol use: No   Drug use: Not Currently   Sexual activity: Yes    Partners: Male    Birth control/protection: I.U.D.    Comment: Liletta   Other Topics Concern   Not on file  Social History Narrative   Lives    Caffeine use:    Social Drivers of Health   Tobacco Use: Low Risk (08/03/2024)   Patient History    Smoking Tobacco Use: Never    Smokeless Tobacco Use: Never    Passive Exposure: Not on file  Financial Resource Strain: Medium Risk (07/01/2024)   Overall Financial Resource Strain (CARDIA)    Difficulty of Paying Living Expenses: Somewhat hard  Food Insecurity: Food Insecurity Present (07/01/2024)   Epic    Worried About Running Out  of Food in the Last Year: Sometimes true    Ran Out of Food in the Last Year: Sometimes true  Transportation Needs: No Transportation Needs (07/01/2024)   Epic    Lack of Transportation (Medical): No    Lack of Transportation (Non-Medical): No  Physical  Activity: Inactive (07/01/2024)   Exercise Vital Sign    Days of Exercise per Week: 0 days    Minutes of Exercise per Session: Not on file  Stress: Stress Concern Present (07/01/2024)   [employer] of Occupational Health - Occupational Stress Questionnaire    Feeling of Stress: To some extent  Social Connections: Socially Isolated (07/01/2024)   Social Connection and Isolation Panel    Frequency of Communication with Friends and Family: More than three times a week    Frequency of Social Gatherings with Friends and Family: Never    Attends Religious Services: Never    Database Administrator or Organizations: No    Attends Engineer, Structural: Not on file    Marital Status: Never married  Intimate Partner Violence: Not At Risk (08/30/2022)   Received from [facility]   HITS    Over the last 12 months how often did your partner physically hurt you?: Never    Over the last 12 months how often did your partner insult you or talk down to you?: Never    Over the last 12 months how often did your partner threaten you with physical harm?: Never    Over the last 12 months how often did your partner scream or curse at you?: Never  Depression (PHQ2-9): Low Risk (02/13/2024)   Depression (PHQ2-9)    PHQ-2 Score: 2  Alcohol Screen: Low Risk (08/29/2023)   Alcohol Screen    Last Alcohol Screening Score (AUDIT): 0  Housing: High Risk (07/01/2024)   Epic    Unable to Pay for Housing in the Last Year: Yes    Number of Times Moved in the Last Year: 0    Homeless in the Last Year: No  Utilities: At Risk (09/06/2023)   AHC Utilities    Threatened with loss of utilities: Yes  Health Literacy: Adequate Health Literacy (08/29/2023)   B1300 Health Literacy    Frequency of need for help with medical instructions: Never    Lab Results  Component Value Date   HGBA1C 9.1 (A) 05/17/2024   HGBA1C 9.1 (A) 10/31/2023   HGBA1C 9.9 (A) 07/22/2023   Lab Results  Component Value Date   CHOL 124  11/11/2023   Lab Results  Component Value Date   HDL 41 11/11/2023   Lab Results  Component Value Date   LDLCALC 67 11/11/2023   Lab Results  Component Value Date   TRIG 79 11/11/2023   Lab Results  Component Value Date   CHOLHDL 3.0 11/11/2023   Lab Results  Component Value Date   CREATININE 1.05 (H) 11/11/2023   No results found for: GFR Lab Results  Component Value Date   MICROALBUR 11.0 07/22/2023      Component Value Date/Time   NA 140 11/11/2023 1643   K 3.8 11/11/2023 1643   CL 100 11/11/2023 1643   CO2 26 11/11/2023 1643   GLUCOSE 220 (H) 11/11/2023 1643   GLUCOSE 228 (H) 07/22/2023 1123   BUN 12 11/11/2023 1643   CREATININE 1.05 (H) 11/11/2023 1643   CREATININE 1.08 (H) 07/22/2023 1123   CALCIUM  9.2 11/11/2023 1643   PROT 6.2 11/11/2023 1643   ALBUMIN 4.1  11/11/2023 1643   AST 19 11/11/2023 1643   ALT 27 11/11/2023 1643   ALKPHOS 56 11/11/2023 1643   BILITOT 0.3 11/11/2023 1643   GFRNONAA 61 03/28/2020 1702   GFRAA 71 03/28/2020 1702      Latest Ref Rng & Units 11/11/2023    4:43 PM 07/22/2023   11:23 AM 05/25/2023   10:03 AM  BMP  Glucose 70 - 99 mg/dL 779  771  719   BUN 6 - 24 mg/dL 12  14  20    Creatinine 0.57 - 1.00 mg/dL 8.94  8.91  8.71   BUN/Creat Ratio 9 - 23 11  13  16    Sodium 134 - 144 mmol/L 140  140  140   Potassium 3.5 - 5.2 mmol/L 3.8  3.7  3.4   Chloride 96 - 106 mmol/L 100  101  98   CO2 20 - 29 mmol/L 26  30  29    Calcium  8.7 - 10.2 mg/dL 9.2  9.3  9.5        Component Value Date/Time   WBC 7.0 10/12/2022 1127   WBC 7.4 01/29/2021 0953   RBC 5.26 10/12/2022 1127   RBC 5.08 01/29/2021 0953   HGB 12.2 10/12/2022 1127   HCT 39.3 10/12/2022 1127   PLT 274 10/12/2022 1127   MCV 75 (L) 10/12/2022 1127   MCH 23.2 (L) 10/12/2022 1127   MCH 22.8 (L) 05/27/2017 0426   MCHC 31.0 (L) 10/12/2022 1127   MCHC 31.8 01/29/2021 0953   RDW 14.2 10/12/2022 1127   LYMPHSABS 2.8 10/12/2022 1127   MONOABS 0.4 01/29/2021 0953    EOSABS 0.2 10/12/2022 1127   BASOSABS 0.0 10/12/2022 1127     Parts of this note may have been dictated using voice recognition software. There may be variances in spelling and vocabulary which are unintentional. Not all errors are proofread. Please notify the dino if any discrepancies are noted or if the meaning of any statement is not clear.   "

## 2024-08-03 NOTE — Patient Instructions (Addendum)
 Will recommend the following: Mounjaro  7.5mg /week U500 insulin  45 units between 12pm-2pm, 190-195 units around 5:30pm-10pm, 15-20 units around 1am-5am -(takes BF around 6pm, lunch and midnight and dinner around 3 am) 30 min before meals. BACK OFF U500 BY 5 UNITS IF BLOOD SUGAR IS LESS THAN 100 Metformin  XR 500mg  two tablets after breakfast and one after dinner  __________________   Goals of DM therapy:  Morning Fasting blood sugar: 80-140  Blood sugar before meals: 80-140 Bed time blood sugar: 100-150  A1C <7%, limited only by hypoglycemia  1.Diabetes medications and their side effects discussed, including hypoglycemia    2. Check blood glucose:  a) Always check blood sugars before driving. Please see below (under hypoglycemia) on how to manage b) Check a minimum of 3 times/day or more as needed when having symptoms of hypoglycemia.   c) Try to check blood glucose before sleeping/in the middle of the night to ensure that it is remaining stable and not dropping less than 100 d) Check blood glucose more often if sick  3. Diet: a) 3 meals per day schedule b: Restrict carbs to 60-70 grams (4 servings) per meal c) Colorful vegetables - 3 servings a day, and low sugar fruit 2 servings/day Plate control method: 1/4 plate protein, 1/4 starch, 1/2 green, yellow, or red vegetables d) Avoid carbohydrate snacks unless hypoglycemic episode, or increased physical activity  4. Regular exercise as tolerated, preferably 3 or more hours a week  5. Hypoglycemia: a)  Do not drive or operate machinery without first testing blood glucose to assure it is over 90 mg%, or if dizzy, lightheaded, not feeling normal, etc, or  if foot or leg is numb or weak. b)  If blood glucose less than 70, take four 5gm Glucose tabs or 15-30 gm Glucose gel.  Repeat every 15 min as needed until blood sugar is >100 mg/dl. If hypoglycemia persists then call 911.   6. Sick day management: a) Check blood glucose more  often b) Continue usual therapy if blood sugars are elevated.   7. Contact the doctor immediately if blood glucose is frequently <60 mg/dl, or an episode of severe hypoglycemia occurs (where someone had to give you glucose/  glucagon  or if you passed out from a low blood glucose), or if blood glucose is persistently >350 mg/dl, for further management  8. A change in level of physical activity or exercise and a change in diet may also affect your blood sugar. Check blood sugars more often and call if needed.  Instructions: 1. Bring glucose meter, blood glucose records on every visit for review 2. Continue to follow up with primary care physician and other providers for medical care 3. Yearly eye  and foot exam 4. Please get blood work done prior to the next appointment

## 2024-08-15 ENCOUNTER — Ambulatory Visit: Admitting: Nurse Practitioner

## 2024-08-17 ENCOUNTER — Ambulatory Visit: Admitting: Podiatry
